# Patient Record
Sex: Female | Born: 1935 | Race: White | Hispanic: No | Marital: Married | Smoking: Former smoker
Health system: Southern US, Community
[De-identification: ages and names within clinical notes are randomized; demographics above are authoritative.]

## PROBLEM LIST (undated history)

## (undated) DIAGNOSIS — J189 Pneumonia, unspecified organism: Secondary | ICD-10-CM

## (undated) DIAGNOSIS — F32A Depression, unspecified: Secondary | ICD-10-CM

## (undated) DIAGNOSIS — I714 Abdominal aortic aneurysm, without rupture, unspecified: Secondary | ICD-10-CM

## (undated) DIAGNOSIS — M1990 Unspecified osteoarthritis, unspecified site: Secondary | ICD-10-CM

## (undated) DIAGNOSIS — G8929 Other chronic pain: Secondary | ICD-10-CM

## (undated) DIAGNOSIS — IMO0002 Reserved for concepts with insufficient information to code with codable children: Secondary | ICD-10-CM

## (undated) DIAGNOSIS — I471 Supraventricular tachycardia: Secondary | ICD-10-CM

## (undated) DIAGNOSIS — F0390 Unspecified dementia without behavioral disturbance: Secondary | ICD-10-CM

## (undated) DIAGNOSIS — M4800 Spinal stenosis, site unspecified: Secondary | ICD-10-CM

## (undated) DIAGNOSIS — R0602 Shortness of breath: Secondary | ICD-10-CM

## (undated) DIAGNOSIS — F329 Major depressive disorder, single episode, unspecified: Secondary | ICD-10-CM

## (undated) DIAGNOSIS — K219 Gastro-esophageal reflux disease without esophagitis: Secondary | ICD-10-CM

## (undated) DIAGNOSIS — R51 Headache: Secondary | ICD-10-CM

## (undated) DIAGNOSIS — G4700 Insomnia, unspecified: Secondary | ICD-10-CM

## (undated) DIAGNOSIS — T4145XA Adverse effect of unspecified anesthetic, initial encounter: Secondary | ICD-10-CM

## (undated) DIAGNOSIS — G629 Polyneuropathy, unspecified: Secondary | ICD-10-CM

## (undated) DIAGNOSIS — F419 Anxiety disorder, unspecified: Secondary | ICD-10-CM

## (undated) DIAGNOSIS — M545 Low back pain, unspecified: Secondary | ICD-10-CM

## (undated) DIAGNOSIS — M79606 Pain in leg, unspecified: Secondary | ICD-10-CM

## (undated) DIAGNOSIS — I10 Essential (primary) hypertension: Secondary | ICD-10-CM

## (undated) DIAGNOSIS — I480 Paroxysmal atrial fibrillation: Secondary | ICD-10-CM

## (undated) DIAGNOSIS — J42 Unspecified chronic bronchitis: Secondary | ICD-10-CM

## (undated) DIAGNOSIS — R296 Repeated falls: Secondary | ICD-10-CM

## (undated) DIAGNOSIS — T8859XA Other complications of anesthesia, initial encounter: Secondary | ICD-10-CM

## (undated) DIAGNOSIS — K5909 Other constipation: Secondary | ICD-10-CM

## (undated) DIAGNOSIS — I639 Cerebral infarction, unspecified: Secondary | ICD-10-CM

## (undated) DIAGNOSIS — I4719 Other supraventricular tachycardia: Secondary | ICD-10-CM

## (undated) DIAGNOSIS — E039 Hypothyroidism, unspecified: Secondary | ICD-10-CM

## (undated) DIAGNOSIS — G4730 Sleep apnea, unspecified: Secondary | ICD-10-CM

## (undated) HISTORY — PX: VAGINAL HYSTERECTOMY: SUR661

## (undated) HISTORY — PX: KNEE ARTHROSCOPY: SHX127

## (undated) HISTORY — PX: CHOLECYSTECTOMY: SHX55

## (undated) HISTORY — PX: ANTERIOR CERVICAL DECOMP/DISCECTOMY FUSION: SHX1161

## (undated) HISTORY — PX: DOPPLER ECHOCARDIOGRAPHY: SHX263

## (undated) HISTORY — DX: Other supraventricular tachycardia: I47.19

## (undated) HISTORY — DX: Repeated falls: R29.6

## (undated) HISTORY — PX: DILATION AND CURETTAGE OF UTERUS: SHX78

## (undated) HISTORY — DX: Abdominal aortic aneurysm, without rupture: I71.4

## (undated) HISTORY — DX: Cerebral infarction, unspecified: I63.9

## (undated) HISTORY — DX: Paroxysmal atrial fibrillation: I48.0

## (undated) HISTORY — PX: EYE SURGERY: SHX253

## (undated) HISTORY — PX: APPENDECTOMY: SHX54

## (undated) HISTORY — DX: Supraventricular tachycardia: I47.1

## (undated) HISTORY — DX: Abdominal aortic aneurysm, without rupture, unspecified: I71.40

## (undated) HISTORY — PX: TONSILLECTOMY: SUR1361

## (undated) HISTORY — DX: Essential (primary) hypertension: I10

## (undated) HISTORY — PX: LUMBAR DISC SURGERY: SHX700

---

## 1998-06-27 ENCOUNTER — Other Ambulatory Visit: Admission: RE | Admit: 1998-06-27 | Discharge: 1998-06-27 | Payer: Self-pay | Admitting: Internal Medicine

## 1998-08-23 ENCOUNTER — Ambulatory Visit (HOSPITAL_COMMUNITY): Admission: RE | Admit: 1998-08-23 | Discharge: 1998-08-23 | Payer: Self-pay | Admitting: Internal Medicine

## 1998-09-21 ENCOUNTER — Inpatient Hospital Stay (HOSPITAL_COMMUNITY): Admission: EM | Admit: 1998-09-21 | Discharge: 1998-09-25 | Payer: Self-pay | Admitting: Emergency Medicine

## 1998-09-21 ENCOUNTER — Encounter: Payer: Self-pay | Admitting: Internal Medicine

## 1998-09-23 ENCOUNTER — Encounter: Payer: Self-pay | Admitting: Internal Medicine

## 1998-11-20 ENCOUNTER — Ambulatory Visit (HOSPITAL_COMMUNITY): Admission: RE | Admit: 1998-11-20 | Discharge: 1998-11-20 | Payer: Self-pay | Admitting: Internal Medicine

## 1999-07-21 ENCOUNTER — Other Ambulatory Visit: Admission: RE | Admit: 1999-07-21 | Discharge: 1999-07-21 | Payer: Self-pay | Admitting: Gynecology

## 1999-08-08 HISTORY — PX: CATARACT EXTRACTION W/ INTRAOCULAR LENS  IMPLANT, BILATERAL: SHX1307

## 1999-10-13 ENCOUNTER — Emergency Department (HOSPITAL_COMMUNITY): Admission: EM | Admit: 1999-10-13 | Discharge: 1999-10-13 | Payer: Self-pay | Admitting: Emergency Medicine

## 1999-10-13 ENCOUNTER — Encounter: Payer: Self-pay | Admitting: Emergency Medicine

## 2001-03-24 ENCOUNTER — Other Ambulatory Visit: Admission: RE | Admit: 2001-03-24 | Discharge: 2001-03-24 | Payer: Self-pay | Admitting: Gynecology

## 2001-06-03 ENCOUNTER — Encounter: Payer: Self-pay | Admitting: Specialist

## 2001-06-04 ENCOUNTER — Encounter: Payer: Self-pay | Admitting: Specialist

## 2001-06-04 ENCOUNTER — Inpatient Hospital Stay (HOSPITAL_COMMUNITY): Admission: EM | Admit: 2001-06-04 | Discharge: 2001-06-14 | Payer: Self-pay | Admitting: Emergency Medicine

## 2001-06-06 ENCOUNTER — Encounter: Payer: Self-pay | Admitting: Specialist

## 2002-05-26 ENCOUNTER — Other Ambulatory Visit: Admission: RE | Admit: 2002-05-26 | Discharge: 2002-05-26 | Payer: Self-pay | Admitting: Gynecology

## 2002-08-31 ENCOUNTER — Encounter: Admission: RE | Admit: 2002-08-31 | Discharge: 2002-08-31 | Payer: Self-pay | Admitting: Gynecology

## 2002-08-31 ENCOUNTER — Encounter: Payer: Self-pay | Admitting: Gynecology

## 2003-01-25 ENCOUNTER — Encounter: Payer: Self-pay | Admitting: Orthopedic Surgery

## 2003-01-30 ENCOUNTER — Inpatient Hospital Stay (HOSPITAL_COMMUNITY): Admission: RE | Admit: 2003-01-30 | Discharge: 2003-02-01 | Payer: Self-pay | Admitting: Orthopedic Surgery

## 2003-08-30 ENCOUNTER — Ambulatory Visit (HOSPITAL_BASED_OUTPATIENT_CLINIC_OR_DEPARTMENT_OTHER): Admission: RE | Admit: 2003-08-30 | Discharge: 2003-08-30 | Payer: Self-pay | Admitting: Orthopedic Surgery

## 2003-11-19 ENCOUNTER — Encounter: Admission: RE | Admit: 2003-11-19 | Discharge: 2003-11-19 | Payer: Self-pay | Admitting: Gynecology

## 2005-07-22 ENCOUNTER — Ambulatory Visit (HOSPITAL_COMMUNITY): Admission: RE | Admit: 2005-07-22 | Discharge: 2005-07-22 | Payer: Self-pay | Admitting: Urology

## 2005-07-22 ENCOUNTER — Ambulatory Visit (HOSPITAL_BASED_OUTPATIENT_CLINIC_OR_DEPARTMENT_OTHER): Admission: RE | Admit: 2005-07-22 | Discharge: 2005-07-22 | Payer: Self-pay | Admitting: Urology

## 2005-07-22 ENCOUNTER — Encounter (INDEPENDENT_AMBULATORY_CARE_PROVIDER_SITE_OTHER): Payer: Self-pay | Admitting: *Deleted

## 2005-09-14 ENCOUNTER — Other Ambulatory Visit: Admission: RE | Admit: 2005-09-14 | Discharge: 2005-09-14 | Payer: Self-pay | Admitting: Gynecology

## 2006-08-25 ENCOUNTER — Emergency Department (HOSPITAL_COMMUNITY): Admission: EM | Admit: 2006-08-25 | Discharge: 2006-08-25 | Payer: Self-pay | Admitting: Emergency Medicine

## 2006-09-21 ENCOUNTER — Ambulatory Visit (HOSPITAL_BASED_OUTPATIENT_CLINIC_OR_DEPARTMENT_OTHER): Admission: RE | Admit: 2006-09-21 | Discharge: 2006-09-21 | Payer: Self-pay | Admitting: Orthopedic Surgery

## 2007-06-01 ENCOUNTER — Encounter: Admission: RE | Admit: 2007-06-01 | Discharge: 2007-06-01 | Payer: Self-pay | Admitting: Gynecology

## 2007-10-26 ENCOUNTER — Ambulatory Visit: Payer: Self-pay | Admitting: Internal Medicine

## 2007-11-10 ENCOUNTER — Ambulatory Visit: Payer: Self-pay | Admitting: Internal Medicine

## 2007-12-14 ENCOUNTER — Encounter: Admission: RE | Admit: 2007-12-14 | Discharge: 2007-12-14 | Payer: Self-pay | Admitting: Internal Medicine

## 2008-01-05 DIAGNOSIS — K5901 Slow transit constipation: Secondary | ICD-10-CM

## 2008-01-05 DIAGNOSIS — K2900 Acute gastritis without bleeding: Secondary | ICD-10-CM | POA: Insufficient documentation

## 2008-06-06 ENCOUNTER — Encounter: Admission: RE | Admit: 2008-06-06 | Discharge: 2008-06-06 | Payer: Self-pay | Admitting: Gynecology

## 2010-01-17 ENCOUNTER — Encounter (INDEPENDENT_AMBULATORY_CARE_PROVIDER_SITE_OTHER): Payer: Self-pay | Admitting: *Deleted

## 2010-01-17 ENCOUNTER — Encounter: Payer: Self-pay | Admitting: Nurse Practitioner

## 2010-01-17 ENCOUNTER — Encounter: Admission: RE | Admit: 2010-01-17 | Discharge: 2010-01-17 | Payer: Self-pay | Admitting: Emergency Medicine

## 2010-01-20 ENCOUNTER — Telehealth: Payer: Self-pay | Admitting: Internal Medicine

## 2010-01-22 ENCOUNTER — Ambulatory Visit: Payer: Self-pay | Admitting: Internal Medicine

## 2010-01-22 DIAGNOSIS — G459 Transient cerebral ischemic attack, unspecified: Secondary | ICD-10-CM | POA: Insufficient documentation

## 2010-01-22 DIAGNOSIS — K5730 Diverticulosis of large intestine without perforation or abscess without bleeding: Secondary | ICD-10-CM | POA: Insufficient documentation

## 2010-01-22 LAB — CONVERTED CEMR LAB
AST: 120 units/L — ABNORMAL HIGH (ref 0–37)
Albumin: 3.8 g/dL (ref 3.5–5.2)
BUN: 27 mg/dL — ABNORMAL HIGH (ref 6–23)
Basophils Relative: 0 % (ref 0.0–3.0)
Bilirubin, Direct: 0.2 mg/dL (ref 0.0–0.3)
Bilirubin, Direct: 0.2 mg/dL (ref 0.0–0.3)
Calcium: 8.7 mg/dL (ref 8.4–10.5)
Chloride: 102 meq/L (ref 96–112)
Eosinophils Absolute: 0.5 10*3/uL (ref 0.0–0.7)
Eosinophils Relative: 6.4 % — ABNORMAL HIGH (ref 0.0–5.0)
Glucose, Bld: 87 mg/dL (ref 70–99)
Indirect Bilirubin: 0.4 mg/dL (ref 0.0–0.9)
Lipase: 29 units/L (ref 11.0–59.0)
Lymphocytes Relative: 11.8 % — ABNORMAL LOW (ref 12.0–46.0)
Neutrophils Relative %: 71.4 % (ref 43.0–77.0)
Potassium: 4.7 meq/L (ref 3.5–5.1)
RBC: 3.62 M/uL — ABNORMAL LOW (ref 3.87–5.11)
Total Protein: 6.4 g/dL (ref 6.0–8.3)
WBC: 7.7 10*3/uL (ref 4.5–10.5)

## 2010-01-23 ENCOUNTER — Encounter: Payer: Self-pay | Admitting: Nurse Practitioner

## 2010-01-23 ENCOUNTER — Ambulatory Visit: Payer: Self-pay | Admitting: Gastroenterology

## 2010-01-23 DIAGNOSIS — R932 Abnormal findings on diagnostic imaging of liver and biliary tract: Secondary | ICD-10-CM

## 2010-01-23 DIAGNOSIS — M549 Dorsalgia, unspecified: Secondary | ICD-10-CM

## 2010-01-28 ENCOUNTER — Ambulatory Visit: Payer: Self-pay | Admitting: Nurse Practitioner

## 2010-01-28 ENCOUNTER — Telehealth: Payer: Self-pay | Admitting: Nurse Practitioner

## 2010-01-28 LAB — CONVERTED CEMR LAB: Prothrombin Time: 10.2 s (ref 9.1–11.7)

## 2010-01-29 ENCOUNTER — Encounter: Payer: Self-pay | Admitting: Gastroenterology

## 2010-02-07 ENCOUNTER — Ambulatory Visit (HOSPITAL_COMMUNITY): Admission: RE | Admit: 2010-02-07 | Discharge: 2010-02-07 | Payer: Self-pay | Admitting: Gastroenterology

## 2010-02-07 ENCOUNTER — Telehealth: Payer: Self-pay | Admitting: Gastroenterology

## 2010-02-07 ENCOUNTER — Ambulatory Visit: Payer: Self-pay | Admitting: Gastroenterology

## 2010-02-11 HISTORY — PX: OTHER SURGICAL HISTORY: SHX169

## 2010-02-14 ENCOUNTER — Ambulatory Visit: Payer: Self-pay | Admitting: Gastroenterology

## 2010-02-14 LAB — CONVERTED CEMR LAB
AST: 18 units/L (ref 0–37)
Albumin: 3.7 g/dL (ref 3.5–5.2)
BUN: 28 mg/dL — ABNORMAL HIGH (ref 6–23)
Calcium: 8.5 mg/dL (ref 8.4–10.5)
Creatinine, Ser: 1.2 mg/dL (ref 0.4–1.2)
GFR calc non Af Amer: 45.61 mL/min (ref >60)
Potassium: 4.4 meq/L (ref 3.5–5.1)
Total Bilirubin: 0.5 mg/dL (ref 0.3–1.2)

## 2010-02-18 ENCOUNTER — Ambulatory Visit: Payer: Self-pay | Admitting: Gastroenterology

## 2010-12-31 ENCOUNTER — Encounter: Admission: RE | Admit: 2010-12-31 | Discharge: 2010-12-31 | Payer: Self-pay | Source: Home / Self Care

## 2011-01-06 NOTE — Assessment & Plan Note (Signed)
Summary: ABNORMAL CT PER DR.DAUB          (DR.BRODIE PT.)         DEBORAH    History of Present Illness Visit Type: Initial Consult Primary GI MD: Lina Sar MD Primary Provider: Lesle Chris, MD Requesting Provider: Lesle Chris, MD Chief Complaint: Abnormal CT History of Present Illness:   Patient seen in the past by Dr. Juanda Chance for constipation. she was last seen at time of her 2008 colonoscopy. Patient referred by Dr. Cleta Alberts for abnormal CTscan and elevated LFTs.   Patient recently fell at home and went to [facility] for evaluation of left rib and back pain. Labs and a CTscan of abd were abnormal. Patient has chronic back pain. She has a spinal cord implant for chronic low back pain. Over the last six months she has developed mid back pain.  Weight stable. No fevers or chills. She has intermitent mild mid abdominal discomfort relieved with application of ice.   Complains of chronic constipation. On chronic narcotics. Uses enemas on occasion. Takes Glycolax but  not everyday secondary to loose stool.    GI Review of Systems      Denies abdominal pain, acid reflux, belching, bloating, chest pain, dysphagia with liquids, dysphagia with solids, heartburn, loss of appetite, nausea, vomiting, vomiting blood, weight loss, and  weight gain.        Denies anal fissure, black tarry stools, change in bowel habit, constipation, diarrhea, diverticulosis, fecal incontinence, heme positive stool, hemorrhoids, irritable bowel syndrome, jaundice, light color stool, liver problems, rectal bleeding, and  rectal pain. Preventive Screening-Counseling & Management  Alcohol-Tobacco     Smoking Status: quit    Current Medications (verified): 1)  Synthroid 112 Mcg Tabs (Levothyroxine Sodium) .... Once Daily 2)  Effexor Xr 75 Mg Xr24h-Cap (Venlafaxine Hcl) .... Two Times A Day 3)  Neurontin 600 Mg Tabs (Gabapentin) .... Take 2 Tablets Four Times A Day 4)  Oxycontin 10 Mg Xr12h-Tab (Oxycodone Hcl) ....  Three Times A Day 5)  Sanctura 20 Mg Tabs (Trospium Chloride) .... Once Daily 6)  Omeprazole 20 Mg Cpdr (Omeprazole) .... Once Daily 7)  Trimethoprim 100 Mg Tabs (Trimethoprim) .... At Bedtime 8)  Enablex 7.5 Mg Xr24h-Tab (Darifenacin Hydrobromide) .... Two Times A Day 9)  Arthrotec 75 75-200 Mg-Mcg Tabs (Diclofenac-Misoprostol) .... Take 1 Tablet By Mouth Two Times A Day 10)  Oxycodone Hcl 5 Mg Tabs (Oxycodone Hcl) .... Take 1-2 Tablets Every 4-6 Hours Daily  Allergies (verified): 1)  ! * Systemic Steroids 2)  ! Codeine 3)  ! * Talacen 4)  ! * Hctz 5)  ! Biaxin  Past History:  Past Medical History: Chronic Back Pain CONSTIPATION, SLOW TRANSIT (ICD-564.01) GASTRITIS, ACUTE (ICD-535.00) Diverticulosis ?Depression, on Effexor  Past Surgical History: Reviewed history from 01/22/2010 and no changes required. Appendectomy Cholecystectomy Hysterectomy Torn rotator cuff, right shoulder Release of right ring finger A1 pulley Cystourethroscopy, hydraulic bladder distention, bladder biopsy,removal of perineal lesion Release of right transverse carpal ligament. Release of right long finger A1 pulley and A0 pulley  Family History: Reviewed history from 01/22/2010 and no changes required. Family History of Lung cancer: Sister Family History of Bone Cancer: Sister Family History of Heart Disease: father  Social History:   Alcohol Use - no Illicit Drug Use - no Patient is a former smoker.  Daily Caffeine Use Smoking Status:  quit  Review of Systems       The patient complains of back pain, confusion, itching, sleeping problems,  swelling of feet/legs, urination - excessive, and urine leakage.  The patient denies allergy/sinus, anemia, anxiety-new, arthritis/joint pain, blood in urine, breast changes/lumps, change in vision, cough, coughing up blood, depression-new, fainting, fatigue, fever, headaches-new, hearing problems, heart murmur, heart rhythm changes, menstrual pain, muscle  pains/cramps, night sweats, nosebleeds, pregnancy symptoms, shortness of breath, skin rash, sore throat, swollen lymph glands, thirst - excessive , urination - excessive , urination changes/pain, vision changes, and voice change.    Vital Signs:  Patient profile:   77 year old female Height:      63 inches Weight:      124.50 pounds BMI:     22.13 Pulse rate:   80 / minute Pulse rhythm:   irregular BP sitting:   132 / 80  (left arm) Cuff size:   regular  Vitals Entered By: June McMurray CMA Duncan Dull) (January 23, 2010 1:44 PM)  Physical Exam  General:  Well developed, well nourished, no acute distress. Head:  Normocephalic and atraumatic. Eyes:  Conjunctiva pink, no icterus.   Ears:  Conjunctiva pink, no icterus.  Mouth:  Tongue slightly dry Neck:  no obvious masses  Lungs:  Clear throughout to auscultation. Heart:  RRR Abdomen:  Abdomen soft, nontender, fullness in LUQ , nondistended. No obvious masses or hepatomegaly.Normal bowel sounds.  Extremities:  1+ BLE edema Neurologic:  Alert and  oriented x4;  grossly normal neurologically. Skin:  Intact without significant lesions or rashes. Cervical Nodes:  No significant cervical adenopathy. Psych:  Alert and cooperative. Normal mood and affect.   Impression & Recommendations:  Problem # 1:  NONSPECIFIC ABN FINDNG RAD&OTH EXAM BILARY TRCT (ICD-793.3) Assessment Comment Only Recent CTscan  (non-contrast) done for left rib and back pain following a fall reveals extensive intrahepatic and extrahepatic biliary dilatation extending to the duodenum.  No associated pancreatic ductal dilatation.  Appearance suspicious for distal biliary duct stricture, non radiodense obstructing choledocholithiasis, or an an ampullary lesion.  LFTs - Normal bilirubin, alkaline phosphatase 312, AST 151, ALT 170.  Post remote cholecystectomy. Patient will need ERCP. Procedure will be done with profofol by Dr. Arlyce Dice at [facility] next Wed.  The risks and  benefits of the procedure, as well as alternatives were discussed with the patient and she agrees to proceed. Intravenous antibiotics to be given just prior to ERCP.  Orders: ERCP (ERCP)  Problem # 2:  DIVERTICULOSIS, COLON (ICD-562.10) Assessment: Comment Only Colonoscopy Dec. 2008 -Moderately severe diverticulosis  Problem # 3:  BACK PAIN, CHRONIC (ICD-724.5) Assessment: Comment Only On chronic narcotics and has spinal cord implant.   Patient Instructions: 1)  Dr. Arlyce Dice will see you at [facility] on 01-29-10 for the Procedure, ERCP.  2)  ERCP brochure provided. 3)  Copy sent to : Lesle Chris, MD

## 2011-01-06 NOTE — Progress Notes (Signed)
Summary: Labs/REV Scheduled   Phone Note Outgoing Call   Call placed by: Laureen Ochs LPN,  February 07, 2010 4:08 PM Call placed to: Patient Summary of Call: Pt.is scheduled for repeat labs on 02-14-10 and an OV with Dr.Joash Tony on 02-18-10 at 4pm. All appt. information reviewed with Mr.Simon by phone.Pt. instructed to call back as needed.  Initial call taken by: Laureen Ochs LPN,  February 07, 2010 4:11 PM

## 2011-01-06 NOTE — Procedures (Signed)
Summary: ERCP  Patient: Chelsea Lynch Note: All result statuses are Final unless otherwise noted.  Tests: (1) ERCP (ERC)   ERC ERCP                  DONE     [facility]     [address] [place], [place]  [address]           ERCP PROCEDURE REPORT           PATIENT:  Chelsea Lynch, Chelsea Lynch  MR#:  [id number]     BIRTHDATE:  1924/12/24  GENDER:  female           ENDOSCOPIST:  Barbette Hair. Arlyce Dice, MD     ASSISTANT:           PROCEDURE DATE:  02/07/2010     PROCEDURE:  ERCP w/ direct viz bile duct, ERCP with sphincterotomy           INDICATIONS:  abnormal Liver Function Tests           MEDICATIONS:   MAC sedation, administered by CRNA, cipro 400 IV     TOPICAL ANESTHETIC:           DESCRIPTION OF PROCEDURE:   After the risks benefits and     alternatives of the procedure were thoroughly explained, informed     consent was obtained.  The  endoscope was introduced through the     mouth and advanced to the third portion of the duodenum.           A normal appearing ampulla was visualized. There was no evidence     of papillitis or any trauma to the ampulla (see image2 and     image3).  A dilatation was found in the common bile duct.  A     dilatation was found in the extrahepatic ducts. Marked diffuse     dilitation of biliary system.     15mm sphincterotomy was made. There was marked drainage of bile     and dye following sphincterotomy.  Cannulation of the common bile     duct was accomplished. The common bile duct and intrahepatics were     normal without filling defects, strictures, or stones. in the     common bile duct. Choledochoscope was passed directly into the     distal CBD up to the level of the cystic duct takeoff. No     abnormalities were seen.    The scope was then completely     withdrawn from the patient and the procedure terminated.     <<PROCEDUREIMAGES>>           COMPLICATIONS:  None           ENDOSCOPIC IMPRESSION:     1) Normal ampulla     2)  Dilatation in the common bile duct     3) Dilatation in the extrahepatic ducts     4) Normal direct visualization of the CBD in the common bile     duct           Findings compatible with ampullary fibrosis           RECOMMENDATIONS:     1) liver enzymes 7-10 days     2) office visit 10 days           ______________________________     Barbette Hair. Arlyce Dice, MD           cc: Dr. Viviann Spare  Daub           n.     eSIGNED:   Barbette Hair. Kincaid Tiger at 02/07/2010 01:58 PM           Ines Bloomer, [id number]  Note: An exclamation mark (!) indicates a result that was not dispersed into the flowsheet. Document Creation Date: 02/07/2010 1:59 PM _______________________________________________________________________  (1) Order result status: Final Collection or observation date-time: 02/07/2010 13:51 Requested date-time:  Receipt date-time:  Reported date-time:  Referring Physician:   Ordering Physician: Melvia Heaps [phone]) Specimen Source:  Source: Launa Grill Order Number: [id number] Lab site:   Appended Document: ERCP Thank Tamera Reason, DB

## 2011-01-06 NOTE — Progress Notes (Signed)
Summary: TRIAGE-Urgent Appt.   Phone Note From Other Clinic Call back at [phone]  [phone]   Caller: Patient Caller: Lupita Leash, ref coor Call For: Dr. Juanda Chance Reason for Call: Schedule Patient Appt Summary of Call: Dr. Lesle Chris would like pt sch'ed asap with Dr. Juanda Chance for abdnormal CT, poss biliaryduct obstruction Initial call taken by: Vallarie Mare,  January 20, 2010 2:34 PM  Follow-up for Phone Call        Message left for Lupita Leash: Pt. will see Willette Cluster NP on 01-21-10 at 2pm. Lupita Leash will advise pt. of appt/med.list/co-pay/cx.policy. I requested she fax records to [phone] Attn: Hulda Marin can call me at [phone]. Follow-up by: Laureen Ochs LPN,  January 20, 2010 3:25 PM     Appended Document: TRIAGE-Urgent Appt. chart reviewed, I have seen pt only for screening colon. I agree with her seeing Tomasa Hosteller ask pt to have labs drawn in am 01/21/2010 for CBC,C-met,total, direct and indirect bili, ,amy, lipase Ca19-9, CEA,Protime.  Appended Document: TRIAGE-Urgent Appt. I called patient to advise her that she needed to have labwork drawn before her appointment with Willette Cluster, NP today. Patient was never told about appointment by Dr Ellis Parents office. Dr Ellis Parents office had already set them up for an appointment with a cardiologist at 2 pm on 01/21/10. I advised the patient that Dr Ellis Parents office requested they be seen as soon as possible, however, they wish to keep the cardiologist appt today and reschedule our appt until 01/23/10 (the next appt available for [facility].Marland KitchenMarland KitchenDr Juanda Chance is out of the office). I have rescheduled the appointment and have advised Laureen Ochs, LPN of this (she scheduled the appt with Lupita Leash at Dr Ellis Parents office). Gavin Pound will call Lupita Leash to discuss. Patient has been advised that even if they reschedule their appt to 2/17, they still must come for labwork today per Dr Regino Schultze request. Patient states that they will come for labs today.  Appended Document:  TRIAGE-Urgent Appt. Message left to advise Donna/Dr.Daub pt. r/s appt. to 01-23-10.  Appended Document: TRIAGE-Urgent Appt. Dr.Daub's office called to say it will be O.K. for pt. to be seen 01-23-10. They will also confirm the appt. w/pt.

## 2011-01-06 NOTE — Procedures (Signed)
Summary: Instructions for procedure/[facility] (out pt)  Instructions for procedure/[facility] (out pt)   Imported By: Sherian Rein 01/28/2010 07:03:49  _____________________________________________________________________  External Attachment:    Type:   Image     Comment:   External Document

## 2011-01-06 NOTE — Letter (Signed)
Summary: EGD Instructions  [facility]  [address] [place], [place] [address]   Phone: [phone]  Fax: [phone]       Chelsea Lynch    01-26-1925    MRN: [id number]       Procedure Day /Date:01-29-10     Arrival Time: 1:00 PM     Procedure Time: 2:00 PM     Location of Procedure:                     [facility] ( Outpatient Registration)    PREPARATION FOR ENDOSCOPY   On 2-23-11THE DAY OF THE PROCEDURE:  1.   No solid foods, milk or milk products are allowed after midnight the night before your procedure.  2.   Do not drink anything colored red or purple.  Avoid juices with pulp.  No orange juice.  3.  You may drink clear liquids until 10:00 , which is 4 hours before your procedure.                                                                                                CLEAR LIQUIDS INCLUDE: Water Jello Ice Popsicles Tea (sugar ok, no milk/cream) Powdered fruit flavored drinks Coffee (sugar ok, no milk/cream) Gatorade Juice: apple, white grape, white cranberry  Lemonade Clear bullion, consomm, broth Carbonated beverages (any kind) Strained chicken noodle soup Hard Candy   MEDICATION INSTRUCTIONS  Unless otherwise instructed, you should take regular prescription medications with a small sip of water as early as possible the morning of your procedure.         OTHER INSTRUCTIONS  You will need a responsible adult at least 75 years of age to accompany you and drive you home.   This person must remain in the waiting room during your procedure.  Wear loose fitting clothing that is easily removed.  Leave jewelry and other valuables at home.  However, you may wish to bring a book to read or an iPod/MP3 player to listen to music as you wait for your procedure to start.  Remove all body piercing jewelry and leave at home.  Total time from sign-in until discharge is approximately 2-3 hours.  You should go home directly  after your procedure and rest.  You can resume normal activities the day after your procedure.  The day of your procedure you should not:   Drive   Make legal decisions   Operate machinery   Drink alcohol   Return to work  You will receive specific instructions about eating, activities and medications before you leave.    The above instructions have been reviewed and explained to me by   _______________________    I fully understand and can verbalize these instructions _____________________________ Date _________

## 2011-01-06 NOTE — Progress Notes (Signed)
Summary: Informed pt of lab appt today for PT, INR   Phone Note Outgoing Call   Call placed by: Joselyn Glassman,  January 28, 2010 11:13 AM Call placed to: Patient Summary of Call: Called pt per Willette Cluster RNP to let her know we need her to come to our lab today before 5:30 PM for her PT, INR.  She commented she is still not sure about  having this ERCP done tomorrow.  I told her we need to know today if she is not going to be there.  She then said she would come to the lab today.  Initial call taken by: Joselyn Glassman,  January 28, 2010 11:15 AM

## 2011-01-06 NOTE — Letter (Signed)
Summary: [facility]  [facility]   Imported By: Sherian Rein 01/30/2010 07:20:46  _____________________________________________________________________  External Attachment:    Type:   Image     Comment:   External Document

## 2011-01-06 NOTE — Assessment & Plan Note (Signed)
Summary: F/U ERCP AND RECENT LABS            [facility]    History of Present Illness Visit Type: Follow-up Visit Primary GI MD: Lina Sar MD Primary Provider: Romero Liner, MD  Requesting Provider: n/a Chief Complaint: F/u from ERCP and recent labs  History of Present Illness:   Chelsea Lynch has returned following ERCP and sphincterotomy.  This was done because of cholestatic liver tests and a CT scan showing diffuse dilatation of the common bile duct.  These findings were verified at ERCP.  Following sphincterotomy there was prompt emptying of contrast material.  Direct visualization of the bile duct was done with a cholodochoscope;  no other abnormalities were seen.  The patient has no GI complaints.  LFTs on Feb 16,2011, prior to sphincterotomy were AST 120, ALT 157 and alkaline phosphatase 330.  Liver function tests from February 14, 2010 were entirely normal.   GI Review of Systems      Denies abdominal pain, acid reflux, belching, bloating, chest pain, dysphagia with liquids, dysphagia with solids, heartburn, loss of appetite, nausea, vomiting, vomiting blood, weight loss, and  weight gain.        Denies anal fissure, black tarry stools, change in bowel habit, constipation, diarrhea, diverticulosis, fecal incontinence, heme positive stool, hemorrhoids, irritable bowel syndrome, jaundice, light color stool, liver problems, rectal bleeding, and  rectal pain.    Current Medications (verified): 1)  Synthroid 112 Mcg Tabs (Levothyroxine Sodium) .... Once Daily 2)  Effexor Xr 75 Mg Xr24h-Cap (Venlafaxine Hcl) .... Two Times A Day 3)  Neurontin 600 Mg Tabs (Gabapentin) .... Take 2 Tablets Four Times A Day 4)  Oxycontin 10 Mg Xr12h-Tab (Oxycodone Hcl) .... Three Times A Day 5)  Sanctura 20 Mg Tabs (Trospium Chloride) .... Once Daily 6)  Omeprazole 20 Mg Cpdr (Omeprazole) .... Once Daily 7)  Trimethoprim 100 Mg Tabs (Trimethoprim) .... At Bedtime 8)  Enablex 7.5 Mg Xr24h-Tab (Darifenacin  Hydrobromide) .... Two Times A Day 9)  Arthrotec 75 75-200 Mg-Mcg Tabs (Diclofenac-Misoprostol) .... Take 1 Tablet By Mouth Two Times A Day 10)  Oxycodone Hcl 5 Mg Tabs (Oxycodone Hcl) .... Take 1-2 Tablets Every 4-6 Hours Daily 11)  Furosemide 20 Mg Tabs (Furosemide) .... One Tablet By Mouth Once Daily  Allergies (verified): 1)  ! * Systemic Steroids 2)  ! Codeine 3)  ! * Talacen 4)  ! * Hctz 5)  ! Biaxin  Past History:  Past Medical History: Reviewed history from 01/23/2010 and no changes required. Chronic Back Pain CONSTIPATION, SLOW TRANSIT (ICD-564.01) GASTRITIS, ACUTE (ICD-535.00) Diverticulosis ?Depression, on Effexor  Past Surgical History: Reviewed history from 01/22/2010 and no changes required. Appendectomy Cholecystectomy Hysterectomy Torn rotator cuff, right shoulder Release of right ring finger A1 pulley Cystourethroscopy, hydraulic bladder distention, bladder biopsy,removal of perineal lesion Release of right transverse carpal ligament. Release of right long finger A1 pulley and A0 pulley  Family History: Family History of Lung cancer: Sister Family History of Bone Cancer: Sister Family History of Heart Disease: father No FH of Colon Cancer:  Social History: Retired  Married  Alcohol Use - no Illicit Drug Use - no Patient is a former smoker.  Daily Caffeine Use  Review of Systems       The patient complains of back pain and cough.  The patient denies allergy/sinus, anemia, anxiety-new, arthritis/joint pain, blood in urine, breast changes/lumps, change in vision, confusion, coughing up blood, depression-new, fainting, fatigue, fever, headaches-new, hearing problems, heart murmur,  heart rhythm changes, itching, menstrual pain, muscle pains/cramps, night sweats, nosebleeds, pregnancy symptoms, shortness of breath, skin rash, sleeping problems, sore throat, swelling of feet/legs, swollen lymph glands, thirst - excessive, urination - excessive, urination  changes/pain, urine leakage, vision changes, and voice change.    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  The patient will return to Dr. Regino Schultze  care for any other GI problems.  Patient Instructions: 1)  Follow with Dr Juanda Chance as needed 2)  The medication list was reviewed and reconciled.  All changed / newly prescribed medications were explained.  A complete medication list was provided to the patient / caregiver.

## 2011-01-06 NOTE — Letter (Signed)
Summary: EGD Instructions  [facility]  [address] [place], [place] [address]   Phone: [phone]  Fax: [phone]       Chelsea Lynch    12-Jun-1925    MRN: [id number]       Procedure Day Dorna Bloom:  [facility] MARCH 4TH, 2011     Arrival Time:  10:30AM     Procedure Time:  12:30PM     Location of Procedure:                     [facility] ( Outpatient Registration)   PREPARATION FOR ERCP   ON THE DAY OF THE PROCEDURE: FRIDAY MARCH 4TH, 2011  1.   No solid foods, milk or milk products are allowed after midnight the night before your procedure.  2.   Do not drink anything colored red or purple.  Avoid juices with pulp.  No orange juice.                                                                                                  CLEAR LIQUIDS INCLUDE: Water Jello Ice Popsicles Tea (sugar ok, no milk/cream) Powdered fruit flavored drinks Coffee (sugar ok, no milk/cream) Gatorade Juice: apple, white grape, white cranberry  Lemonade Clear bullion, consomm, broth Carbonated beverages (any kind) Strained chicken noodle soup Hard Candy   MEDICATION INSTRUCTIONS  Unless otherwise instructed, you should take regular prescription medications with a small sip of water as early as possible the morning of your procedure.          OTHER INSTRUCTIONS  You will need a responsible adult at least 75 years of age to accompany you and drive you home.   This person must remain in the waiting room during your procedure.  Wear loose fitting clothing that is easily removed.  Leave jewelry and other valuables at home.  However, you may wish to bring a book to read or an iPod/MP3 player to listen to music as you wait for your procedure to start.  Remove all body piercing jewelry and leave at home.  Total time from sign-in until discharge is approximately 3-5 hours.  You should go home directly after your procedure and rest.  You can resume normal  activities the day after your procedure.  The day of your procedure you should not:   Drive   Make legal decisions   Operate machinery   Drink alcohol   Return to work  You will receive specific instructions about eating, activities and medications before you leave.    The above instructions have been reviewed and explained to [name] by phone and mailed to her by Laureen Ochs LPN  January 29, 2010 2:38 PM      Appended Document: EGD Instructions Letter mailed to patient.

## 2011-02-05 ENCOUNTER — Other Ambulatory Visit: Payer: Self-pay | Admitting: Internal Medicine

## 2011-02-05 DIAGNOSIS — R209 Unspecified disturbances of skin sensation: Secondary | ICD-10-CM

## 2011-02-06 ENCOUNTER — Other Ambulatory Visit: Payer: Self-pay

## 2011-02-12 HISTORY — PX: OTHER SURGICAL HISTORY: SHX169

## 2011-02-18 ENCOUNTER — Ambulatory Visit
Admission: RE | Admit: 2011-02-18 | Discharge: 2011-02-18 | Disposition: A | Discharge status: Home / Self Care | Payer: Medicare Other | Source: Ambulatory Visit | Attending: Internal Medicine | Admitting: Internal Medicine

## 2011-02-18 DIAGNOSIS — R209 Unspecified disturbances of skin sensation: Secondary | ICD-10-CM

## 2011-02-19 HISTORY — PX: OTHER SURGICAL HISTORY: SHX169

## 2011-04-12 ENCOUNTER — Emergency Department (HOSPITAL_COMMUNITY)
Admission: EM | Admit: 2011-04-12 | Discharge: 2011-04-12 | Disposition: A | Discharge status: Home / Self Care | Payer: Medicare Other | Attending: Emergency Medicine | Admitting: Emergency Medicine

## 2011-04-12 DIAGNOSIS — W2203XA Walked into furniture, initial encounter: Secondary | ICD-10-CM | POA: Insufficient documentation

## 2011-04-12 DIAGNOSIS — Z23 Encounter for immunization: Secondary | ICD-10-CM | POA: Insufficient documentation

## 2011-04-12 DIAGNOSIS — Y92009 Unspecified place in unspecified non-institutional (private) residence as the place of occurrence of the external cause: Secondary | ICD-10-CM | POA: Insufficient documentation

## 2011-04-12 DIAGNOSIS — S51809A Unspecified open wound of unspecified forearm, initial encounter: Secondary | ICD-10-CM | POA: Insufficient documentation

## 2011-04-21 NOTE — Assessment & Plan Note (Signed)
[facility]                         GASTROENTEROLOGY OFFICE NOTE   EVAH, RASHID                       MRN:          [id number]  DATE:10/26/2007                            DOB:          Apr 08, 1925    Ms. Commerford is an 77 year old white female here to discuss colorectal  screening.  We have seen her in the past for a screening colonoscopy in  1998.  Unfortunately, we do not have the records of the actual exam.  She has over the years had some constipation and has been followed by  Dr. Fannie Knee for asthmatic bronchitis.  I think she also had an upper  endoscopy in 1999, but I do not have that record either.  She had a  prior cholecystectomy.   MEDICATIONS:  1. HCTZ 25 mg p.o. daily.  2. Vesicare 10 mg p.o. daily.  3. Enablex 15 mg p.o. daily.  4. Synthroid 112 mcg daily.  5. Effexor XR 75 mg p.o. b.i.d.  6. Omeprazole 20 mg p.o. q.a.m.  7. Neurontin 600 mg t.i.d.  8. Oxycodone, OxyContin, and Ambien CR.   PAST MEDICAL HISTORY:  1. TIA.  2. Cholecystectomy.  3. Hysterectomy.   FAMILY HISTORY:  Negative for colon cancer.  One sister died of bone  cancer, multiple myeloma, one sister of lung cancer.  Father had heart  disease.   SOCIAL HISTORY:  Married with two children.  She is retired from Engineering geologist  work.  She does not drink alcohol.   REVIEW OF SYSTEMS:  Positive for swelling of her legs, joint pain,  sleeping problems, back pain, leakage of urine, excessive urination,  cystic breast changes.   PHYSICAL EXAMINATION:  Blood pressure 96/64, pulse 60.  Weight 122  pounds.  She is alert and oriented, appearing younger than her stated age.  Sclerae are anicteric.  NECK:  Supple.  LUNGS:  Clear to auscultation.  COR:  Normal S1 and S2.  ABDOMEN:  Soft.  Normoactive bowel sounds.  Nondistended.  Post  cholecystectomy horizontal scar in right upper quadrant.  RECTAL:  Not done.  EXTREMITIES:  No edema.   IMPRESSION:  An 77 year old  white female with constipation, likely  related to decreased transit time caused by multiple narcotics, which  she takes.  She is a good candidate for colonoscopy.  It has been at  least 10 years since the last one.   PLAN:  1. Colonoscopy schedule with Osmoprep.  I have discussed the prep as      well as conscious sedation with the patient.  2. Increased fiber in the diet.  Take MiraLax, as per Dr. Renne Crigler.     Hedwig Morton. Juanda Chance, MD  Electronically Signed    DMB/MedQ  DD: 10/26/2007  DT: 10/26/2007  Job #: [id number]   cc:   Soyla Murphy. Renne Crigler, M.D.

## 2011-04-24 NOTE — Op Note (Signed)
NAME:  DELORUS, LANGWELL                          ACCOUNT NO.:  [id number]   MEDICAL RECORD NO.:  [id number]                   PATIENT TYPE:  INP   LOCATION:  0002                                 FACILITY:  [facility]   PHYSICIAN:  Marlowe Kays, M.D.               DATE OF BIRTH:  1925/02/19   DATE OF PROCEDURE:  01/30/2003  DATE OF DISCHARGE:                                 OPERATIVE REPORT   PREOPERATIVE DIAGNOSIS:  Torn rotator cuff, right shoulder.   POSTOPERATIVE DIAGNOSIS:  Torn rotator cuff, right shoulder.   OPERATION:  1. Open resection, distal right clavicle.  2. Anterior acromionectomy.  3. Repair of torn rotator cuff, right shoulder.   SURGEON:  Marlowe Kays, M.D.   ASSISTANTDruscilla Brownie. Cherlynn June.   ANESTHESIA:  General.   INDICATIONS FOR PROCEDURE:  She had sudden pain in the right shoulder around  November of last year. She came to see me on November 06, 2002, and I sent  her for an MRI which was performed on November 15, 2002, and demonstrated  full thickness tear of the supraspinatus tendon with a defect measuring  about 1.5 cm in diameter. It was also felt that the underneath surface of  the distal clavicle was also a possible deforming factor. At surgery the  tear had actually extended and was now about 3.5 cm and the underneath  surface of the distal clavicle was quite prominent and corresponded to the  tear of the anterior rotator cuff and it was felt that distal clavicle  excision was indicated.   DESCRIPTION OF PROCEDURE:  Prophylactic antibiotics and a scalene block by  the anesthesiologist followed by general anesthesia, the right shoulder  girdle was prepped with Duraprep and draped in a sterile field and she was  placed on the Schlein frame, but because of hypotension, we could not really  use the beach chair position initially, but were able assume it later on as  her hypotension seemed to improve. Collier Flowers was employed.   A vertical incision  was made initially from roughly the acromioclavicular  joint going down vertically. The fascia on the anterior acromion was opened  along with the skin incision back to the acromioclavicular joint which was  identified with a Mellody Dance needle. I then undermined the distal clavicle which  was coming fourth confirming the tear.   Protecting the underlying rotator cuff structures, I made my initial  acromionectomy, preserving the acromioclavicular joint. I then had to  perform little additional removal of bone on the underneath surface of the  acromion. She had fairly exuberant bursa, which I partially excised, and I  then visualized the true extent of the tear, which was avulsed from the bone  at the level of the greater tuberosity, and on the supraspinatus there was a  good bit of healthy tendon anteriorly and laterally. The biceps tendon was  intact. She had a  good bit of prominence of the undersurface of the distal  clavicle which corresponded well to the site of the tear, and it was felt  that a distal clavicle resection was indicated in this particular case.   Accordingly I extended the incision and curved it somewhat anteriorly and  with subperiosteal dissection exposed the distal clavicle. I then undermined  it and protecting all underlying structures with retractors marked out about  a distal 1.5 cm and then used a microsaw to resect this portion of the  clavicle. Small spicules of bone were removed from the undersurface of the  remaining clavicle which were then covered with bone wax.   I then returned to the rotator cuff and after restoring the rotator cuff to  its normal anatomy with hemostats and deciding on a proper approach, I  abraded the bone above the greater tuberosity with a rongeur and then used 1  rotator cuff anchor at this level, bringing the rotator cuff which had  avulsed from the bone, reattaching it at this level.   I then supplemented this with multiple interrupted  Ethibond sutures around  the perimeter, working anteriorly. At this point the large portion of the  tear anteriorly had now come together with the remaining rotator cuff which  was already intact, and I repaired this defect with multiple interrupted #1  Ethibond as well. This gave a nice solid repair  with her arm to her side.  We checked and there did not appear to be any residual impingement.   The wound was then well irrigated with sterile saline. Gelfoam was placed in  the resected clavicle interval. The deltoid muscle was then reapproximated  in 2 layers with interrupted #1 Vicryl and the fascia over the anterior  acromion and distal clavicle resected area with the same. The subcutaneous  tissue was closed with a combination of 2-0 Vicryl and 4-0 Vicryl and the  skin with Steri-Strips. A dry sterile dressing and a shoulder immobilizer  were applied.   She tolerated the procedure well. The patient was transferred to the  recovery room in satisfactory condition with no complications.                                               Marlowe Kays, M.D.    JA/MEDQ  D:  01/30/2003  T:  01/30/2003  Job:  [id number]

## 2011-04-24 NOTE — H&P (Signed)
NAME:  Chelsea Lynch, Chelsea Lynch                          ACCOUNT NO.:  [id number]   MEDICAL RECORD NO.:  [id number]                   PATIENT TYPE:  INP   LOCATION:  NA                                   FACILITY:  [facility]   PHYSICIAN:  Marlowe Kays, M.D.               DATE OF BIRTH:  [birth year]   DATE OF ADMISSION:  01/30/2003  DATE OF DISCHARGE:                                HISTORY & PHYSICAL   CHIEF COMPLAINT:  Pain in my right shoulder.   HISTORY OF PRESENT ILLNESS:  This 77 year old lady had an injury to her  right shoulder in November of last year.  She was reaching out to the right  when she had a severe onset of right shoulder pain, heard a pop, and had  subsequent loss of motion.  She was able to survive with this discomfort  through the holidays and it really could not wait any longer for any  surgical repair due to the progressive nature and then persistent nature of  the pain in the right shoulder.  She has no loss of function of the right  upper extremity other than abduction, internal/external rotation.  She finds  difficulty in getting dressed, doing her hair, and other normal functions  with the right shoulder.  She has had an MRI which has shown a full  thickness tear of the distal supraspinatus tendon.  There was also seen mid  spurring of the undersurface of the distal clavicle as well as subacromial  bursitis and a type 2 acromion with spurring.  This is a very active lady.  She and her husband enjoy active activities and unfortunately she has had  this interfere with those activities.   ALLERGIES:  This patient is allergic to PENICILLIN.   CURRENT MEDICATIONS:  1. Effexor XR 75 mg one b.i.d.  2. Neurontin 600 mg two tablets q.i.d.  3. OxyContin 40 mg one q.i.d.  4. Oxycodone IR 5 mg one capsule four or five times a day.  5. Prevacid 30 mg one daily.  6. Toprol XL 50 mg one-quarter to one-half tablet daily.  7. Synthroid 112 mcg one daily.  8. Premarin 0.625 mg  one daily.  9. Ambien 10 mg at bedtime.  10.      Remeron 30 mg SolTab one at h.s.   Soyla Murphy. Renne Crigler, M.D. is her internal medicine physician.   PAST MEDICAL HISTORY:  1. Hypothyroidism.  2. Hypertension.  3. Diverticulitis.  4. GERD.  5. Chronic low back pain for which she uses a tens unit.   PAST SURGICAL HISTORY:  1. Cervical laminectomy in the past.  2. Lumbar laminectomy x2.  3. She has also had a hysterectomy in 1960.  4. Cholecystectomy in 1970.  5. Bladder tack suspension Marshall-Marchetti in 1985.  6. Appendectomy as a child.  7. Knee surgery 1986.   SOCIAL HISTORY:  The patient is married, homemaker.  Has no intake of  alcohol or tobacco products.  Has two daughters and her husband will be  caregiver after surgery.   FAMILY HISTORY:  Father died of heart disease, mother of a stroke, and a  sister with multiple myeloma.   REVIEW OF SYSTEMS:  CNS:  No seizure, shoulder paralysis, numbness, or  double vision.  The patient has chronic low back pain with radiation of the  right lower extremity.  She is currently in Eloisa Northern, M.D. pain program  using tens unit and the above medications for her chronic pain.  CARDIOVASCULAR:  No chest pain.  No angina.  No orthopnea.  RESPIRATORY:  No  productive cough.  No hemoptysis.  No shortness of breath.  GASTROINTESTINAL:  No nausea, vomiting, melena, or bloody stools.  GENITOURINARY:  The patient has urgency and frequency in the evening and at  night.   PHYSICAL EXAMINATION:  GENERAL:  Alert, cooperative, friendly, somewhat  anxious 77 year old white female looking younger than stated age.  VITAL SIGNS:  Blood pressure 150/84, pulse 84, regular, respirations 12,  unlabored.  HEENT:  Normocephalic.  Oropharynx is clear.  CHEST:  Clear to auscultation.  No rhonchi or rales.  HEART:  Regular rate and rhythm.  No murmurs are heard.  ABDOMEN:  Soft, nontender.  Liver, spleen not felt.  RECTAL:  Not done.  PELVIC:  Not  done.  BREASTS:  Not done.  EXTREMITIES:  Right shoulder as in present illness above.   ADMITTING DIAGNOSES:  1. Tear of rotator cuff of the right shoulder.  2. Hypothyroidism.  3. Hypertension.  4. Chronic low back pain.    PLAN:  The patient will undergo anterior acromionectomy with repair of the  rotator cuff (open) of the right shoulder.  Due to her chronic pain  situation with her back, she will have a scalene block to the right shoulder  and we would anticipate a hospital stay of several days secondary to getting  the pain under control postoperatively for her right shoulder.  Should we  have any medical problems, certainly ask Soyla Murphy. Renne Crigler, M.D. to follow  along with [place].     Dooley L. Cherlynn June.                 Marlowe Kays, M.D.    DLU/MEDQ  D:  01/24/2003  T:  01/24/2003  Job:  [id number]   cc:   Soyla Murphy. Renne Crigler, M.D.  [address]  [place]  [place] [address]  Fax: [phone]

## 2011-04-24 NOTE — Op Note (Signed)
NAMEMICHAELEEN, DOWN                ACCOUNT NO.:  [id number]   MEDICAL RECORD NO.:  [id number]          PATIENT TYPE:  AMB   LOCATION:  DSC                          FACILITY:  [facility]   PHYSICIAN:  Katy Fitch. Sypher, M.D. DATE OF BIRTH:  [birth year]   DATE OF PROCEDURE:  09/21/2006  DATE OF DISCHARGE:                                 OPERATIVE REPORT   PREOP DIAGNOSIS:  1. Chronic right carpal tunnel syndrome.  2. Chronic stenosing tenosynovitis right long finger at A1 pulley.   POSTOP DIAGNOSIS:  1. Chronic right carpal tunnel syndrome.  2. Chronic stenosing tenosynovitis right long finger at A1 pulley.   OPERATION:  1. Release of right transverse carpal ligament.  2. Release of right long finger A1 pulley and A0 pulley.   OPERATING SURGEON:  Katy Fitch. Sypher, M.D.   ASSISTANT:  Marveen Reeks. Dasnoit, P.A.-C.   ANESTHESIA:  General by LMA.   SUPERVISING ANESTHESIOLOGIST:  Quita Skye. Krista Blue, M.D.   INDICATIONS:  Thurma Priego is a 77 year old woman referred through the  courtesy of Dr. Merri Brunette for evaluation and management of bilateral hand  numbness and triggering of her long fingers of the right and left hands.  Ms. Donn is failed nonoperative measures and has abnormal diagnostic  studies.  We recommended proceeding with release of her right transverse  carpal ligament, and release of her right long finger A1 pulley at this  time.  Informed consent was completed in the office and in the preoperative  holding area with Ms. Rajewski's daughter present   PROCEDURE:  Jamilet Ambroise is brought to the operating room and placed in  supine position upon the operating table.  Following induction of general  anesthesia by LMA technique.  The right arm was prepped with Betadine soap  and solution, and sterilely draped.  A pneumatic tourniquet was applied to  the proximal right brachium.  Following exsanguination of the right arm with  an Esmarch bandage, arterial tourniquet is inflated to 220  mmHg.   The procedure commenced with a short incision in the line of the ring finger  and the palm.  The subcutaneous tissues were carefully divided along the  palmar fascia.  This was split longitudinally __________ appearance of the  median nerve.  These were followed back to the transverse carpal ligament  which was gently isolated from the median nerve.  The ligament was then  released along its ulnar border extending into the distal forearm.  This  widely opened the carpal canal.  No masses were noted.  There was noted be a  persistent median vessel which can be a cause of carpal tunnel syndrome.  The wound was then repaired with intradermal 3-0 Prolene suture.   Attention was then directed to the long finger flexor sheath.  There is a  palpable swelling at the A1 pulley.  A 1-cm transverse incision was  fashioned in the distal palmar crease over the swollen A1 pulley.  The  subcutaneous tissue was carefully divided taking care to identify the A1  pulley and release it along its radial border.  Attention  was then directed  proximally where a small accessory A0 pulley was identified.  This was  released subcutaneously.  Thereafter, free range of motion of the long  finger was recovered.   Ms. Hlavaty tolerated surgery and anesthesia well.  Her wounds were repaired  with intradermal 3-0 Prolene and Steri-Strips followed by infiltration of 2%  lidocaine for postoperative analgesia.  She was placed in a compressive  dressing; and advised to begin immediate range of motion excises.  Note for  aftercare, she is provided a prescription for Vicodin 5 mg one p.o. q.4-6 h.  p.r.n. pain 20 tablets without refill.  She will return to see me for  followup in the office in 1 week.      Katy Fitch Sypher, M.D.  Electronically Signed     RVS/MEDQ  D:  09/21/2006  T:  09/22/2006  Job:  [id number]   cc:   Soyla Murphy. Renne Crigler, M.D.

## 2011-04-24 NOTE — H&P (Signed)
[facility]  Patient:    Chelsea Lynch, Chelsea Lynch                       MRN: [id number] Adm. Date:  [id number] Attending:  Erasmo Leventhal Dictator:   Alexzandrew L. Perkins, P.A.-C.                         History and Physical  CHIEF COMPLAINT:  Severe back pain.  HISTORY OF PRESENT ILLNESS:  The patient is a 77 year old female who is well known at [facility] having previously been treated by Chelsea Deter A. Grant Ruts., M.D. for a long history of degenerative disk disease. She has hadmultiple flare-ups in the past. However, approximately two weeks ago, she had a recurrence and a flare-up of her low back pain. This has been crescendo-type pain over the past two weeks to the point where on the evening of June 03, 2001, it became so severe that she required calling the on-call physician. Due to her pain level, she was recommended to go to the hospital for evaluation. She was seen and evaluated and found to have severe low back pain which was intractable at the time. It was decided that she would be admitted for pain control and evaluation. She was subsequently admitted on the morning of June 04, 2001.  ALLERGIES:  PENICILLIN causes a rash.  INTOLERANCES:  CODEINE causes GI upset.  CURRENT MEDICATIONS: 1. OxyContin 40 mg p.o. t.i.d. 2. Micardis 40 mg p.o. q.d. 3. Neurontin 300 mg p.o. q.i.d. 4. Robaxin 500 mg every 6 hours p.r.n. spasm. 5. Effexor 37.5 mg p.o. b.i.d. 6. Toprol XL 50 mg q.d. 7. Vicodin 5 mg p.r.n. pain. 8. Prevacid daily.  PAST MEDICAL HISTORY:  History of hypertension, also longstanding history of degenerative disk disease, and also history of rectocele.  PAST SURGICAL HISTORY:  She has had prior neck surgery, back surgery x 2. She has also had a cholecystectomy and appendectomy, hysterectomy, and also knee surgery.  SOCIAL HISTORY:  She is married. Denies use of tobacco products or alcohol products.  FAMILY HISTORY:   Negative.  REVIEW OF SYSTEMS:  GENERAL:  No fevers, chills, or night sweats. NEUROLOGICAL:  The patient has had some radicular-type pain in the lower extremities which has been increasing over the past two weeks, more so in the right leg but also new symptoms in the left leg. No seizures. No paralysis. RESPIRATORY:  No shortness of breath or productive cough. CARDIOVASCULAR:  No chest pain, angina, or orthopnea. GI:  No nausea, vomiting, diarrhea, or constipation. GU:  No discharge, dysuria, or hematuria. MUSCULOSKELETAL: Pertinent to the back and the legs found in the history of present illness.  PHYSICAL EXAMINATION:  GENERAL:  The patient is a 77 year old, thin, frail-appearing, white female, moderate distress secondary to pain. She is accompanied by her family members. Seen lying supine on a hospital stretcher in the emergency department. Noted to be again in moderate distress.  VITAL SIGNS:  Temperature is 98, blood pressure 151/77, pulse is 70, respirations 20.  HEENT:  Normocephalic, atraumatic. Pupils are round and reactive. Oropharynx is clear.  NECK:  Supple.  CHEST:  Clear to auscultation anteriorly and laterally. No rhonchi or rales appreciated.  HEART:  Regular rate and rhythm. No murmurs. No rubs, thrills, or palpitation are appreciated on exam.  ABDOMEN:  Soft, flat, nontender. Bowel sounds are present. No rebound or guarding is noted.  GENITALIA/RECTAL/BREASTS:  Not  done. Not pertinent to present illness.  MUSCULOSKELETAL:  Pertinent to that of the low back and the lower extremities. The patient is tender to palpation in the lower lumbar region and the lumbosacral region. Motor function:  She does have some weakness noted with right foot dorsiflexion in the EHL as compared to the left. Deep tendon reflexes are present. They are 1+ at the knees bilaterally, +1 at the right ankle, +2 at the left ankle. Sensation is intact throughout the lower extremities with  distal pulses noted to be intact.  LABORATORY DATA:  X-rays show severe degenerative disk changes noted at L4-5 and L5-S1 with facet arthritis.  IMPRESSION: 1. Severe degenerative disk disease with acute exacerbation and radicular pain    components. 2. Rectocele. 3. Hypertension.  PLAN:  The patient will be admitted to [facility] and placed at bedrest. Pain control measures will be provided and workup as indicated. DD:  06/07/01 TD:  06/07/01 Job: [id number]

## 2011-04-24 NOTE — Op Note (Signed)
NAME:  Chelsea Lynch, Chelsea Lynch                          ACCOUNT NO.:  [id number]   MEDICAL RECORD NO.:  [id number]                   PATIENT TYPE:  AMB   LOCATION:  DSC                                  FACILITY:  [facility]   PHYSICIAN:  Katy Fitch. Naaman Plummer., M.D.          DATE OF BIRTH:  February 05, 1925   DATE OF PROCEDURE:  08/30/2003  DATE OF DISCHARGE:                                 OPERATIVE REPORT   PREOPERATIVE DIAGNOSES:  1. Chronic stenosing tenosynovitis, right ring finger.  2. Stenosing tenosynovitis, right thumb.   POSTOPERATIVE DIAGNOSES:  1. Chronic stenosing tenosynovitis, right ring finger.  2. Stenosing tenosynovitis, right thumb.   OPERATION:  1. Release of right ring finger A1 pulley.  2. Injection of right thumb flexor sheath with Depo-Medrol and lidocaine 1%     without epinephrine.   OPERATING SURGEON:  Katy Fitch. Sypher, M.D.   ASSISTANT:  Chelsea Lynch, P.A.   ANESTHESIA:  0.25% Marcaine and 2% lidocaine metacarpal head-level block of  right ring finger and injection of right thumb with a mixture of 1% plain  lidocaine and Depo-Medrol 3mc/mL 0.5 mL, supervised by the  anesthesiologist, Quita Skye. Krista Blue, M.D.   INDICATIONS:  Chelsea Lynch is a 77 year old woman referred for painful  triggering of her thumb and ring fingers.  Clinical examination revealed  signs of mild triggering of the thumb and significant locking of the ring  finger.  The ring finger locking had been going on for months.   Due to a failure to respond to nonoperative measures, she is brought to the  operating room at this time for release of her right ring finger A1 pulley  and injection while under sedation of the right thumb flexor sheath.   After informed consent, she was brought to the operating room.   DESCRIPTION OF PROCEDURE:  Chelsea Lynch is brought to the operating room and  placed in the supine position on the operating table.  Following light  sedation, the right arm was prepped  with Betadine soap and solution and  sterilely draped.  When her level of consciousness was appropriate, 0.25%  Marcaine and 2% lidocaine were infiltrated at the metacarpal head level at  the ring finger and an injection of Depo-Medrol and lidocaine was provided  into the thumb flexor sheath.  Both injections were technically  satisfactory.  When anesthesia was complete, the arm was exsanguinated with  an Esmarch bandage and arterial tourniquet on the proximal brachium inflated  to 220 mmHg.   The procedure commenced with a short transverse incision directly over the  A1 pulley.  The subcutaneous tissues were carefully divided, revealing the  palmar fascia.  This was released with scissors, followed by isolation of  the pulley and protection of the neurovascular bundles.  The pulley was  split with scissors and a small A0 pulley was released proximally.   Thereafter full range of motion of the finger  was recovered.   The wound was repaired with mattress sutures of 5-0 nylon.  A compressive  dressing was applied to the palm and ring finger site with fluff gauze and  two-inch Ace wrap.   There were no apparent complications.  Ms. Erker tolerated the surgery and  anesthesia well.  She was transferred to the recovery room with stable vital  signs.  For aftercare she is given a prescription for Darvocet-N 100 one or  two tablets p.o. q.4-6h. p.r.n. pain.  She will return to our office for  follow-up in seven to 10 days for suture removal.  She is encouraged to move  her fingers immediately.                                                Katy Fitch Naaman Plummer., M.D.    RVS/MEDQ  D:  08/30/2003  T:  08/31/2003  Job:  [id number]   cc:   Soyla Murphy. Renne Crigler, M.D.  [address]  [place]  [place] [address]  Fax: [phone]

## 2011-04-24 NOTE — Op Note (Signed)
[name], Chelsea Lynch                ACCOUNT NO.:  [id number]   MEDICAL RECORD NO.:  [id number]          PATIENT TYPE:  AMB   LOCATION:  [facility]                         FACILITY:  [facility]   PHYSICIAN:  Ronald L. Earlene Plater, M.D.  DATE OF BIRTH:  07-17-1925   DATE OF PROCEDURE:  07/22/2005  DATE OF DISCHARGE:                                 OPERATIVE REPORT   DIAGNOSIS:  Questionable interstitial cystitis.   OPERATION:  Cystourethroscopy, hydraulic bladder distention, bladder biopsy,  removal of perineal lesion.   SURGEON:  Dr. Gaynelle Arabian.   ANESTHESIA:  LMA.   ESTIMATED BLOOD LOSS:  Negligible.   TUBES:  None.   COMPLICATIONS:  None.   INDICATIONS FOR PROCEDURE:  [name] is a lovely 77 year old white female  who presented with significant frequency of urination and significant  nocturia.  She has rare urge incontinence with some help on Ditropan.  She  was noted in the office on cystourethroscopy to have some diffuse  inflammation throughout the bladder and was suspicious for interstitial  cystitis.  After understanding the risks, benefits and alternatives, she has  elected to proceed.  She also wanted to proceed with removal of a perineal  lesion, and this could be added to the actual procedure, removal of perineal  lesion, and we elected to do so.   PROCEDURE IN DETAIL:  Patient was placed in the supine position.  After  proper LMA anesthesia, was placed in the dorsal lithotomy position and  prepped and draped with Betadine in a sterile fashion.  The right tiny 2 mm  lesion in the right perineum was excised sharply with scissors.  There is no  significant bleeding.  It was submitted to pathology.  Cystourethroscopy was  performed with a 22.5 French Olympus pan endoscope utilizing the 12 and 70  degree lenses.  The bladder was carefully inspected.  Efflux of clear urine  was noted from the normally placed ureteral orifices bilaterally.  The  bladder was filled.  There was a  significant rectocele noted.  Then 80 cc of  water at 400 cc capacity, she began grossly leaking around the cystoscope;  therefore, it was stopped.  The bladder was drained.  She had 400 cc  capacity, although we are not sure it was 80 cm of water pressure due to the  leaking.  There was some mild glomerulations noted posteriorly.  Biopsies  were obtained of the posterior midline with cold cut  biopsies.  Forceps were submitted to pathology.  The base was cauterized  with Bovie electrocoagulation cautery.  No other lesions were noted.  The  bladder was drained.  The pan endoscope was removed.  The patient was taken  to the recovery room stable.      Ronald L. Earlene Plater, M.D.  Electronically Signed     RLD/MEDQ  D:  07/22/2005  T:  07/22/2005  Job:  [id number]

## 2011-04-24 NOTE — Consult Note (Signed)
[facility]  Patient:    Chelsea Lynch, Chelsea Lynch                       MRN: [id number] Proc. Date: 06/06/01 Adm. Date:  [id number] Attending:  Erasmo Leventhal CC:         Kerrin Champagne, M.D.   Consultation Report  CHIEF COMPLAINT:  Leg weakness and back pain.  HISTORY OF PRESENT ILLNESS:  Chelsea Lynch is a 77 year old woman with a history of chronic back pain, seen by Dr. Kerrin Champagne and also by Dr. Marlan Palau in the past.  She has been followed at a chronic pain center and has been on OxyContin.  She has been complaining of worsening back pain for several weeks and then Friday, felt like she could not walk because her legs were weak and numbness.  Weakness and numbness have resolved but the pain continues.  Though she can move her legs well in bed, has not attempted to get up out of bed.  No arm problems but does complain of neck pain.  REVIEW OF SYSTEMS:  Positive for chronic severe back pain and neck pain.  No incontinence.  PAST MEDICAL HISTORY:  Significant for chronic pain syndrome related to her back, hypertension, degenerative disk disease, rectocele.  She has had lumbar surgery twice, neck surgery once, cholecystectomy, appendectomy, knee surgery, hysterectomy.  Has hypothyroidism.  MEDICATIONS:  Solu-Medrol, Neurontin, Micardis, Effexor, Toprol-XL, Protonix, Dilaudid, OxyContin 40 mg q.8h., Synthroid, estradiol, Valium, oxycodone, Reglan, Compazine, Zofran, Benadryl, Ambien, Narcan is ordered as a p.r.n. in case of over-narcotization.  ALLERGIES:  PENICILLIN and CODEINE.  SOCIAL HISTORY:  She is married.  No cigarette or alcohol use.  FAMILY HISTORY:  Noncontributory.  PHYSICAL EXAMINATION:  VITAL SIGNS:  Temperature 99.8, pulse 82, respirations 18, blood pressure 160/90.  HEENT:  Head is normocephalic, atraumatic.  NECK:  Supple.  EXTREMITIES:  Without edema.  NEUROLOGIC:  Mental status exam:  She is awake, alert and  oriented with normal language.  Cranial nerves:  Pupils are equal and reactive.  Visual fields are full to confrontation.  Extraocular movements are intact.  Facial sensation is normal. Facial motor activity is normal.  Hearing is intact.  Palate is symmetric. Tongue is midline.  There is no dysarthria.  Motor exam:  She has normal bulk, tone and strength throughout, including her lower extremities.  While lying in bed, she is moving her legs all around though randomly and spontaneously without any difficulty or complaints of pain.  I am able to get her to sit on the edge of the bed, then stand and then walk with contact assist only, although she says she feels light-headed because she has not been upright for days.  Reflexes:  I cannot elicit deep tendon reflexes in her lower extremities, although I can get 1+ biceps.  She has downgoing toes.  Coordination:  Finger-to-nose and heel-to-shin are intact.  Sensory exam is intact objectively.  IMAGING STUDY:  MRI scan of the lumbar spine showed no severe stenosis or large herniation, although there was a small herniation at L5-S1 and some moderate canal stenosis bilaterally at L3-4.  There is an L4-5 laminectomy site.  CT and myelogram report I cannot find at present but apparently did not show surgically significant changes in her spine.  IMPRESSION: 1. Chronic back pain. 2. Transient leg weakness and numbness of uncertain etiology.  It could be    something like Guillain-Barre, as I  cannot find lower extremity reflexes,    but, in fact, she has no actual weakness present now and that would be    rather of short duration of only two or three days.  I doubt that she has    thoracic spine or cervical spine or brain lesion as there are no    myelopathic findings to suggest upper motor neuron process.  RECOMMENDATION:  Would begin mobilizing with PT.  No other specific recommendations at this time.  If demonstrable myelopathic  findings or persistent leg weakness or numbness occur, MRI scan of the T spine, C spine and brain may help exclude upper motor neuron process.  She might benefit from returning to the pain clinic for reassessment of her medication. DD:  06/06/01 TD:  06/07/01 Job: [id number]

## 2011-04-24 NOTE — Discharge Summary (Signed)
[facility]  Patient:    Chelsea Lynch, Chelsea Lynch                       MRN: [id number] Adm. Date:  [id number] Disc. Date: 06/14/01 Attending:  Erasmo Leventhal Dictator:   Irena Cords, P.A.-C. CC:         Soyla Murphy. Renne Crigler, M.D.  Adelene Amas. Williford, M.D.  Catherine A. Orlin Hilding, M.D.   Discharge Summary  PRINCIPAL DIAGNOSES: 1. Severe low back pain, chronic back pain exacerbation. 2. Depression. 3. Hyponatremia. 4. Hypokalemia.  SECONDARY DIAGNOSIS:  Hypertension.  SURGICAL PROCEDURE:  None.  CONSULTATIONS: 1. Catherine A. Orlin Hilding, M.D., neurology. 2. Adelene Amas. Williford, M.D., psychiatry. 3. Gretta Cool, M.D., GYN. 4. Laurier Nancy, M.D., physical medicine and rehabilitation.  LABORATORY DATA:  Upon admission the patient had a potassium of 2.9, which was treated and improved to 4.6 on the days prior to discharge.  Sodium was 131 on June 29, and stayed around that, but increased to 134 prior to discharge. Sedimentation rate was 1 on admission.  White count was 6.1.  A T4 was 8.2, TSH was 0.944, and a T3 was 122.8.  ALT and ALP were within normal limits at 14 and 92, respectively.  Lumbar myelogram showed no evidence of a central spinal stenosis.  There was a mild anterior defect on the thecal sac at L4-5 level, and mild to moderate impression of the right lateral portion of the thecal sac at L5-S1 due to facet disease.  Post myelogram CT showed small broad based disk bulge at L4-5 and mild to moderate facet disease throughout the lower lumbar spine, but does not appear to cause neuroforaminal narrowing or significant central spinal stenosis.  MRI of the lumbar spine status post right-sided hemilaminectomy L4-5 and L5-S1 level without evidence of a large recurrent disk herniation, significant spinal stenosis, neuroforaminal narrowing, or nerve root compressions.  There was some left-sided L4-5 facet degenerative changes.  There was  an L3-4 bilateral facet degenerative changes with no significant neurologic compromise.  Plain radiographs showed some mild scoliotic deformity of the lumbar spine and degenerative disc disease pronounced at L4-5 and L5-S1.  HOSPITAL COURSE:  The patient was initially admitted to Dr. Thomasena Edis service for severe exacerbation of back pain.  Dr. Otelia Sergeant was also involved in the patients care from the date of her admission.  Studies, including an MRI, CT, and a post CT myelogram were ordered, but showed no significant central spinal stenosis or neuroforaminal narrowing.  No surgical indications or findings. GYN was consulted, Dr. Nicholas Lose, for ______ insertion on the second hospital day.  We had extreme difficulty controlling the patients pain initially, and medications were adjusted throughout her hospital stay.  The patient was noted to have hyponatremia and hypokalemia on admission.  Her potassium was increased which resolved her hypokalemia.  Fluid restriction was started which just had a minimal impact on the patients hyponatremia.  She remained asymptomatic from both though.  Dr. Marcelino Freestone of neurology was consulted, as the patient did have some subjective lower extremity weakness and numbness.  She had no specific recommendations at this time, as she found no neurologic findings of persistent weakness, numbness, or tingling.  She recommended beginning mobilization when the patient returns to the pain clinic.  Also if her symptoms persistent, would recommend repeating her studies.  Physical therapy was consulted.  The patient progressed slowly with therapy initially.  Dr. Jeanie Sewer in psychiatry was consulted.  He  felt the patient would benefit from increasing her Effexor XR as well as visiting the psychotherapy clinic upon discharge, doing this as an outpatient.  Valium was increased as well.  Dr. Johna Roles of physical medicine rehabilitation was consulted for consideration of  inpatient rehab.  He felt the patient was not an acceptable candidate for inpatient rehab.  Therefore, discharge planning was consulted for arrangements of home health physical therapy.  On June 07, 2001, primary service was switched over from Dr. Thomasena Edis to Dr. Otelia Sergeant. Arrangements were also being made to obtain a new four lead TENS unit for the patient, as she had an old two lead that had quite worn out.  By June 14, 2001, the patient had stabilized, her pain had improved, and she was ready for discharge home.  The patient was ambulating upwards of 150 feet with the assistance of a walker independently prior to discharge.  DISPOSITION:  The patient will be discharged home.  DIET:  Resume a regular diet.  ACTIVITY:  She may weight bear as tolerated with the assistance of a walker. She is to have no heavy lifting, bending, or stooping.  Utilize a four lead TENS unit, this is being arranged to be delivered to the patient.  She will have home health physical therapy.  FOLLOWUP: 1. Dr. Otelia Sergeant in one week.  She is to call [phone] for an appointment. 2. Follow up with Dr. Merri Brunette in one week for hyponatremia and    hypokalemia followup.  She is to call for an appointment. 3. Follow up with Dr. Jeanie Sewer in psychiatry in the next one to two weeks for    consideration about the patients psychotherapy.  She is to call that    office for an appointment. 4. Follow up with Dr. Alfonse Flavors in [place] at the [facility] for    pain control.  She has an August appointment.  DISCHARGE MEDICATIONS: 1. OxyContin 80 mg one p.o. q.8h. 2. Oxy IR 5 to 10 mg p.o. q.4-6h. p.r.n. breakthrough pain. 3. Valium 7.5 mg p.o. t.i.d. 4. Vioxx 25 mg p.o. q.d. 5. Effexor XR 112.5 mg p.o. q.a.m. 6. Neurontin 300 mg p.o. t.i.d. with meals and 600 mg p.o. q.h.s. 7. Toprol XL 50 mg p.o. q.d. 8. Micardis 40 mg p.o. q.d. 9. Prevacid one p.o. b.i.d.   CONDITION ON DISCHARGE:  Good and improved. DD:   06/14/01 TD:  06/14/01 Job: [id number]

## 2011-08-03 ENCOUNTER — Other Ambulatory Visit: Payer: Self-pay | Admitting: Gynecology

## 2011-08-03 DIAGNOSIS — Z1231 Encounter for screening mammogram for malignant neoplasm of breast: Secondary | ICD-10-CM

## 2011-08-07 ENCOUNTER — Ambulatory Visit: Payer: Medicare Other

## 2011-08-11 ENCOUNTER — Ambulatory Visit
Admission: RE | Admit: 2011-08-11 | Discharge: 2011-08-11 | Disposition: A | Discharge status: Home / Self Care | Payer: Medicare Other | Source: Ambulatory Visit | Attending: Gynecology | Admitting: Gynecology

## 2011-08-11 DIAGNOSIS — Z1231 Encounter for screening mammogram for malignant neoplasm of breast: Secondary | ICD-10-CM

## 2011-11-16 ENCOUNTER — Other Ambulatory Visit: Payer: Self-pay | Admitting: Gynecology

## 2011-11-16 DIAGNOSIS — N63 Unspecified lump in unspecified breast: Secondary | ICD-10-CM

## 2011-11-27 ENCOUNTER — Ambulatory Visit
Admission: RE | Admit: 2011-11-27 | Discharge: 2011-11-27 | Disposition: A | Discharge status: Home / Self Care | Payer: Medicare Other | Source: Ambulatory Visit | Attending: Gynecology | Admitting: Gynecology

## 2011-11-27 DIAGNOSIS — N63 Unspecified lump in unspecified breast: Secondary | ICD-10-CM

## 2012-02-25 HISTORY — PX: OTHER SURGICAL HISTORY: SHX169

## 2012-05-10 ENCOUNTER — Other Ambulatory Visit: Payer: Self-pay | Admitting: Dermatology

## 2012-09-02 ENCOUNTER — Other Ambulatory Visit: Payer: Self-pay | Admitting: Neurology

## 2012-09-02 DIAGNOSIS — N3941 Urge incontinence: Secondary | ICD-10-CM

## 2012-09-02 DIAGNOSIS — F29 Unspecified psychosis not due to a substance or known physiological condition: Secondary | ICD-10-CM

## 2012-10-04 ENCOUNTER — Other Ambulatory Visit: Payer: Self-pay | Admitting: Gynecology

## 2012-10-04 DIAGNOSIS — Z1231 Encounter for screening mammogram for malignant neoplasm of breast: Secondary | ICD-10-CM

## 2012-11-10 ENCOUNTER — Ambulatory Visit: Payer: Medicare Other

## 2013-03-25 ENCOUNTER — Emergency Department (HOSPITAL_COMMUNITY): Payer: Medicare Other

## 2013-03-25 ENCOUNTER — Encounter (HOSPITAL_COMMUNITY): Payer: Self-pay

## 2013-03-25 ENCOUNTER — Inpatient Hospital Stay (HOSPITAL_COMMUNITY)
Admission: EM | Admit: 2013-03-25 | Discharge: 2013-04-06 | DRG: 871 | Disposition: A | Discharge status: Home Health | Payer: Medicare Other | Attending: Internal Medicine | Admitting: Internal Medicine

## 2013-03-25 ENCOUNTER — Other Ambulatory Visit: Payer: Self-pay

## 2013-03-25 DIAGNOSIS — F19939 Other psychoactive substance use, unspecified with withdrawal, unspecified: Secondary | ICD-10-CM | POA: Diagnosis present

## 2013-03-25 DIAGNOSIS — E039 Hypothyroidism, unspecified: Secondary | ICD-10-CM | POA: Diagnosis present

## 2013-03-25 DIAGNOSIS — N179 Acute kidney failure, unspecified: Secondary | ICD-10-CM | POA: Diagnosis present

## 2013-03-25 DIAGNOSIS — E876 Hypokalemia: Secondary | ICD-10-CM | POA: Diagnosis present

## 2013-03-25 DIAGNOSIS — A419 Sepsis, unspecified organism: Secondary | ICD-10-CM | POA: Diagnosis present

## 2013-03-25 DIAGNOSIS — R6521 Severe sepsis with septic shock: Secondary | ICD-10-CM | POA: Diagnosis present

## 2013-03-25 DIAGNOSIS — R7989 Other specified abnormal findings of blood chemistry: Secondary | ICD-10-CM | POA: Diagnosis present

## 2013-03-25 DIAGNOSIS — A4150 Gram-negative sepsis, unspecified: Principal | ICD-10-CM | POA: Diagnosis present

## 2013-03-25 DIAGNOSIS — G92 Toxic encephalopathy: Secondary | ICD-10-CM | POA: Diagnosis present

## 2013-03-25 DIAGNOSIS — Z79899 Other long term (current) drug therapy: Secondary | ICD-10-CM

## 2013-03-25 DIAGNOSIS — I471 Supraventricular tachycardia, unspecified: Secondary | ICD-10-CM | POA: Diagnosis present

## 2013-03-25 DIAGNOSIS — R932 Abnormal findings on diagnostic imaging of liver and biliary tract: Secondary | ICD-10-CM

## 2013-03-25 DIAGNOSIS — G8929 Other chronic pain: Secondary | ICD-10-CM | POA: Diagnosis present

## 2013-03-25 DIAGNOSIS — R4182 Altered mental status, unspecified: Secondary | ICD-10-CM

## 2013-03-25 DIAGNOSIS — K5901 Slow transit constipation: Secondary | ICD-10-CM

## 2013-03-25 DIAGNOSIS — Z888 Allergy status to other drugs, medicaments and biological substances status: Secondary | ICD-10-CM

## 2013-03-25 DIAGNOSIS — D649 Anemia, unspecified: Secondary | ICD-10-CM | POA: Diagnosis present

## 2013-03-25 DIAGNOSIS — I959 Hypotension, unspecified: Secondary | ICD-10-CM

## 2013-03-25 DIAGNOSIS — R9389 Abnormal findings on diagnostic imaging of other specified body structures: Secondary | ICD-10-CM

## 2013-03-25 DIAGNOSIS — M549 Dorsalgia, unspecified: Secondary | ICD-10-CM | POA: Diagnosis present

## 2013-03-25 DIAGNOSIS — G929 Unspecified toxic encephalopathy: Secondary | ICD-10-CM | POA: Diagnosis present

## 2013-03-25 DIAGNOSIS — R7881 Bacteremia: Secondary | ICD-10-CM | POA: Diagnosis present

## 2013-03-25 DIAGNOSIS — R1310 Dysphagia, unspecified: Secondary | ICD-10-CM | POA: Diagnosis present

## 2013-03-25 DIAGNOSIS — J690 Pneumonitis due to inhalation of food and vomit: Secondary | ICD-10-CM | POA: Diagnosis present

## 2013-03-25 DIAGNOSIS — K219 Gastro-esophageal reflux disease without esophagitis: Secondary | ICD-10-CM | POA: Diagnosis present

## 2013-03-25 DIAGNOSIS — N189 Chronic kidney disease, unspecified: Secondary | ICD-10-CM | POA: Diagnosis present

## 2013-03-25 DIAGNOSIS — I129 Hypertensive chronic kidney disease with stage 1 through stage 4 chronic kidney disease, or unspecified chronic kidney disease: Secondary | ICD-10-CM | POA: Diagnosis present

## 2013-03-25 DIAGNOSIS — N12 Tubulo-interstitial nephritis, not specified as acute or chronic: Secondary | ICD-10-CM | POA: Diagnosis present

## 2013-03-25 DIAGNOSIS — R0902 Hypoxemia: Secondary | ICD-10-CM | POA: Diagnosis present

## 2013-03-25 DIAGNOSIS — F329 Major depressive disorder, single episode, unspecified: Secondary | ICD-10-CM | POA: Diagnosis present

## 2013-03-25 DIAGNOSIS — F411 Generalized anxiety disorder: Secondary | ICD-10-CM | POA: Diagnosis present

## 2013-03-25 DIAGNOSIS — N289 Disorder of kidney and ureter, unspecified: Secondary | ICD-10-CM

## 2013-03-25 DIAGNOSIS — I214 Non-ST elevation (NSTEMI) myocardial infarction: Secondary | ICD-10-CM | POA: Diagnosis present

## 2013-03-25 DIAGNOSIS — I4891 Unspecified atrial fibrillation: Secondary | ICD-10-CM | POA: Diagnosis present

## 2013-03-25 DIAGNOSIS — K2900 Acute gastritis without bleeding: Secondary | ICD-10-CM

## 2013-03-25 DIAGNOSIS — F1193 Opioid use, unspecified with withdrawal: Secondary | ICD-10-CM | POA: Diagnosis present

## 2013-03-25 DIAGNOSIS — G4700 Insomnia, unspecified: Secondary | ICD-10-CM | POA: Diagnosis present

## 2013-03-25 DIAGNOSIS — G9341 Metabolic encephalopathy: Secondary | ICD-10-CM

## 2013-03-25 DIAGNOSIS — F1120 Opioid dependence, uncomplicated: Secondary | ICD-10-CM | POA: Diagnosis present

## 2013-03-25 DIAGNOSIS — F0390 Unspecified dementia without behavioral disturbance: Secondary | ICD-10-CM | POA: Diagnosis present

## 2013-03-25 DIAGNOSIS — Z7982 Long term (current) use of aspirin: Secondary | ICD-10-CM

## 2013-03-25 DIAGNOSIS — R410 Disorientation, unspecified: Secondary | ICD-10-CM

## 2013-03-25 DIAGNOSIS — K5730 Diverticulosis of large intestine without perforation or abscess without bleeding: Secondary | ICD-10-CM

## 2013-03-25 DIAGNOSIS — F1123 Opioid dependence with withdrawal: Secondary | ICD-10-CM

## 2013-03-25 DIAGNOSIS — G459 Transient cerebral ischemic attack, unspecified: Secondary | ICD-10-CM

## 2013-03-25 DIAGNOSIS — F3289 Other specified depressive episodes: Secondary | ICD-10-CM | POA: Diagnosis present

## 2013-03-25 DIAGNOSIS — K5900 Constipation, unspecified: Secondary | ICD-10-CM | POA: Diagnosis present

## 2013-03-25 DIAGNOSIS — Z880 Allergy status to penicillin: Secondary | ICD-10-CM

## 2013-03-25 HISTORY — DX: Hypothyroidism, unspecified: E03.9

## 2013-03-25 HISTORY — DX: Gastro-esophageal reflux disease without esophagitis: K21.9

## 2013-03-25 HISTORY — DX: Other chronic pain: G89.29

## 2013-03-25 HISTORY — DX: Major depressive disorder, single episode, unspecified: F32.9

## 2013-03-25 HISTORY — DX: Depression, unspecified: F32.A

## 2013-03-25 HISTORY — DX: Insomnia, unspecified: G47.00

## 2013-03-25 HISTORY — DX: Anxiety disorder, unspecified: F41.9

## 2013-03-25 HISTORY — DX: Other constipation: K59.09

## 2013-03-25 HISTORY — DX: Polyneuropathy, unspecified: G62.9

## 2013-03-25 LAB — COMPREHENSIVE METABOLIC PANEL
BUN: 30 mg/dL — ABNORMAL HIGH (ref 6–23)
CO2: 23 mEq/L (ref 19–32)
Chloride: 98 mEq/L (ref 96–112)
Creatinine, Ser: 1.81 mg/dL — ABNORMAL HIGH (ref 0.50–1.10)
GFR calc non Af Amer: 24 mL/min — ABNORMAL LOW (ref >90)
Total Bilirubin: 0.7 mg/dL (ref 0.3–1.2)

## 2013-03-25 LAB — URINALYSIS, ROUTINE W REFLEX MICROSCOPIC
Protein, ur: 100 mg/dL — AB
Urobilinogen, UA: 0.2 mg/dL (ref 0.0–1.0)

## 2013-03-25 LAB — CBC WITH DIFFERENTIAL/PLATELET
Basophils Relative: 0 % (ref 0–1)
HCT: 33.8 % — ABNORMAL LOW (ref 36.0–46.0)
Hemoglobin: 11.9 g/dL — ABNORMAL LOW (ref 12.0–15.0)
Lymphocytes Relative: 5 % — ABNORMAL LOW (ref 12–46)
MCHC: 35.2 g/dL (ref 30.0–36.0)
MCV: 87.1 fL (ref 78.0–100.0)
Monocytes Absolute: 0.7 10*3/uL (ref 0.1–1.0)
Monocytes Relative: 7 % (ref 3–12)
Neutro Abs: 9.6 10*3/uL — ABNORMAL HIGH (ref 1.7–7.7)

## 2013-03-25 LAB — RAPID URINE DRUG SCREEN, HOSP PERFORMED
Barbiturates: NOT DETECTED
Tetrahydrocannabinol: NOT DETECTED

## 2013-03-25 LAB — POCT I-STAT, CHEM 8
BUN: 30 mg/dL — ABNORMAL HIGH (ref 6–23)
Calcium, Ion: 1.12 mmol/L — ABNORMAL LOW (ref 1.13–1.30)
Glucose, Bld: 103 mg/dL — ABNORMAL HIGH (ref 70–99)
TCO2: 23 mmol/L (ref 0–100)

## 2013-03-25 LAB — D-DIMER, QUANTITATIVE: D-Dimer, Quant: 3.8 ug/mL-FEU — ABNORMAL HIGH (ref 0.00–0.48)

## 2013-03-25 LAB — LACTIC ACID, PLASMA: Lactic Acid, Venous: 2 mmol/L (ref 0.5–2.2)

## 2013-03-25 LAB — SALICYLATE LEVEL: Salicylate Lvl: <2 mg/dL — ABNORMAL LOW (ref 2.8–20.0)

## 2013-03-25 LAB — POCT I-STAT TROPONIN I: Troponin i, poc: 0.5 ng/mL (ref 0.00–0.08)

## 2013-03-25 LAB — URINE MICROSCOPIC-ADD ON

## 2013-03-25 MED ORDER — DEXTROSE 5 % IV SOLN
1.0000 g | Freq: Once | INTRAVENOUS | Status: AC
  Administered 2013-03-26: 1 g via INTRAVENOUS
  Filled 2013-03-25: qty 10

## 2013-03-25 MED ORDER — ADENOSINE 6 MG/2ML IV SOLN
12.0000 mg | Freq: Once | INTRAVENOUS | Status: AC
  Administered 2013-03-25: 12 mg via INTRAVENOUS

## 2013-03-25 MED ORDER — ADENOSINE 6 MG/2ML IV SOLN
INTRAVENOUS | Status: AC
  Filled 2013-03-25: qty 4

## 2013-03-25 MED ORDER — ADENOSINE 6 MG/2ML IV SOLN
6.0000 mg | Freq: Once | INTRAVENOUS | Status: AC
  Administered 2013-03-25: 6 mg via INTRAVENOUS

## 2013-03-25 NOTE — ED Provider Notes (Signed)
History     CSN: [id number]  Arrival date & time 03/25/13  2017   First MD Initiated Contact with Patient 03/25/13 2031      Chief Complaint  Patient presents with  . Tachycardia    (Consider location/radiation/quality/duration/timing/severity/associated sxs/prior treatment) Patient is a 79 y.o. female presenting with altered mental status. The history is provided by the EMS personnel and the spouse (Caregivers).  Altered Mental Status This is a new (Patient was brought in by EMS with rapid heartbeat. Her caregivers noted that she had altered mental status and fever, and was therefore brought to [facility] [place] for evaluation. She is on medications for chronic pain as well as for hypothyroidism, hyperten) problem. The current episode started 3 to 5 hours ago. The problem occurs constantly. The problem has been rapidly worsening. Pertinent negatives include no chest pain, no abdominal pain, no headaches and no shortness of breath. Nothing aggravates the symptoms. Nothing relieves the symptoms. Treatments tried: Patient brought to [facility] via EMS. They had tried carotid massage with some brief breaking of her rapid heartbeat.    Past Medical History  Diagnosis Date  . Chronic back pain   . Hypothyroid   . GERD (gastroesophageal reflux disease)   . Depression   . Neuropathy   . Constipation, chronic   . Insomnia   . Anxiety   . Chronic pain     History reviewed. No pertinent past surgical history.  No family history on file.  History  Substance Use Topics  . Smoking status: Never Smoker   . Smokeless tobacco: Never Used  . Alcohol Use: No    OB History   Grav Para Term Preterm Abortions TAB SAB Ect Mult Living                  Review of Systems  Unable to perform ROS: Mental status change  Respiratory: Negative for shortness of breath.   Cardiovascular: Negative for chest pain.  Gastrointestinal: Negative for abdominal pain.  Neurological: Negative for  headaches.  Psychiatric/Behavioral: Positive for altered mental status.    Allergies  Clarithromycin; Codeine; Penicillins; and Tequin  Home Medications   Current Outpatient Rx  Name  Route  Sig  Dispense  Refill  . aspirin 81 MG tablet   Oral   Take 81 mg by mouth daily.         . Biotin 5000 MCG CAPS   Oral   Take 5,000 mcg by mouth daily.         Marland Kitchen docusate sodium (COLACE) 100 MG capsule   Oral   Take 100 mg by mouth daily with supper.         . fish oil-omega-3 fatty acids 1000 MG capsule   Oral   Take 2 g by mouth daily.         . furosemide (LASIX) 20 MG tablet   Oral   Take 20 mg by mouth daily.         Marland Kitchen gabapentin (NEURONTIN) 400 MG capsule   Oral   Take 400 mg by mouth 3 (three) times daily.         Marland Kitchen levothyroxine (SYNTHROID, LEVOTHROID) 125 MCG tablet   Oral   Take 125 mcg by mouth daily before breakfast.         . Linaclotide (LINZESS) 145 MCG CAPS   Oral   Take 145 mcg by mouth every evening.         . metoprolol succinate (TOPROL-XL)  25 MG 24 hr tablet   Oral   Take 37.5 mg by mouth daily.         . mirabegron ER (MYRBETRIQ) 50 MG TB24   Oral   Take 50 mg by mouth daily.         Marland Kitchen omeprazole (PRILOSEC) 20 MG capsule   Oral   Take 20 mg by mouth daily before breakfast.         . oxyCODONE (OXYCONTIN) 10 MG 12 hr tablet   Oral   Take 10 mg by mouth every 12 (twelve) hours.         Marland Kitchen oxyCODONE-acetaminophen (PERCOCET/ROXICET) 5-325 MG per tablet   Oral   Take 1 tablet by mouth every 8 (eight) hours as needed for pain.         . potassium chloride (K-DUR,KLOR-CON) 10 MEQ tablet   Oral   Take 10 mEq by mouth every other day.         . traZODone (DESYREL) 50 MG tablet   Oral   Take 100 mg by mouth at bedtime.         Marland Kitchen trimethoprim (TRIMPEX) 100 MG tablet   Oral   Take 100 mg by mouth at bedtime. For bladder         . venlafaxine XR (EFFEXOR-XR) 75 MG 24 hr capsule   Oral   Take 75 mg by mouth 2  (two) times daily.         . Vitamin D, Ergocalciferol, (DRISDOL) 50000 UNITS CAPS   Oral   Take 50,000 Units by mouth every 7 (seven) days.         . zaleplon (SONATA) 10 MG capsule   Oral   Take 10 mg by mouth at bedtime.           BP 71/46  Pulse 94  Temp(Src) 99.1 F (37.3 C) (Oral)  Resp 21  SpO2 83%  Physical Exam  Nursing note and vitals reviewed. Constitutional:  Ill-appearing elderly woman with heart rate of 180.  HENT:  Head: Normocephalic and atraumatic.  Right Ear: External ear normal.  Left Ear: External ear normal.  Mouth/Throat: Oropharynx is clear and moist.  Eyes: Conjunctivae and EOM are normal. Pupils are equal, round, and reactive to light. No scleral icterus.  Neck: Normal range of motion. Neck supple.  Cardiovascular:  Tachycardia, no murmur.   Pulmonary/Chest: Effort normal and breath sounds normal.  Abdominal: Soft. Bowel sounds are normal.  Musculoskeletal: Normal range of motion. She exhibits no edema and no tenderness.  Neurological:  Weakly arousable.  No apparent sensory or moor deficit.  Skin: Skin is warm and dry.  Psychiatric:  Unable to assess.    ED Course  CRITICAL CARE Performed by: Osvaldo Human Authorized by: Osvaldo Human Total critical care time: 60 minutes Critical care was necessary to treat or prevent imminent or life-threatening deterioration of the following conditions: Elderly woman came in with altered mental status, had SVT and hypotension, treated with IV adenosine, then with IV fluids for hypotension.  Workup showed she had a UTI --> Rx with IV Rocephin.  Critical Care called to admit her to ICU.   Critical care was time spent personally by me on the following activities: discussions with consultants, evaluation of patient's response to treatment, examination of patient, obtaining history from patient or surrogate, ordering and performing treatments and interventions, ordering and review of laboratory  studies, ordering and review of radiographic studies, pulse oximetry and re-evaluation of  patient's condition.   (including critical care time)   Results for orders placed during the hospital encounter of 03/25/13  CBC WITH DIFFERENTIAL      Result Value Range   WBC 10.9 (*) 4.0 - 10.5 K/uL   RBC 3.88  3.87 - 5.11 MIL/uL   Hemoglobin 11.9 (*) 12.0 - 15.0 g/dL   HCT 16.1 (*) 09.6 - 04.5 %   MCV 87.1  78.0 - 100.0 fL   MCH 30.7  26.0 - 34.0 pg   MCHC 35.2  30.0 - 36.0 g/dL   RDW 40.9  81.1 - 91.4 %   Platelets 151  150 - 400 K/uL   Neutrophils Relative 88 (*) 43 - 77 %   Neutro Abs 9.6 (*) 1.7 - 7.7 K/uL   Lymphocytes Relative 5 (*) 12 - 46 %   Lymphs Abs 0.5 (*) 0.7 - 4.0 K/uL   Monocytes Relative 7  3 - 12 %   Monocytes Absolute 0.7  0.1 - 1.0 K/uL   Eosinophils Relative 0  0 - 5 %   Eosinophils Absolute 0.0  0.0 - 0.7 K/uL   Basophils Relative 0  0 - 1 %   Basophils Absolute 0.0  0.0 - 0.1 K/uL  COMPREHENSIVE METABOLIC PANEL      Result Value Range   Sodium 132 (*) 135 - 145 mEq/L   Potassium 3.5  3.5 - 5.1 mEq/L   Chloride 98  96 - 112 mEq/L   CO2 23  19 - 32 mEq/L   Glucose, Bld 101 (*) 70 - 99 mg/dL   BUN 30 (*) 6 - 23 mg/dL   Creatinine, Ser 7.82 (*) 0.50 - 1.10 mg/dL   Calcium 8.7  8.4 - 95.6 mg/dL   Total Protein 6.6  6.0 - 8.3 g/dL   Albumin 3.1 (*) 3.5 - 5.2 g/dL   AST 16  0 - 37 U/L   ALT 10  0 - 35 U/L   Alkaline Phosphatase 87  39 - 117 U/L   Total Bilirubin 0.7  0.3 - 1.2 mg/dL   GFR calc non Af Amer 24 (*) >90 mL/min   GFR calc Af Amer 28 (*) >90 mL/min  URINALYSIS, ROUTINE W REFLEX MICROSCOPIC      Result Value Range   Color, Urine YELLOW  YELLOW   APPearance TURBID (*) CLEAR   Specific Gravity, Urine 1.017  1.005 - 1.030   pH 5.5  5.0 - 8.0   Glucose, UA NEGATIVE  NEGATIVE mg/dL   Hgb urine dipstick MODERATE (*) NEGATIVE   Bilirubin Urine NEGATIVE  NEGATIVE   Ketones, ur NEGATIVE  NEGATIVE mg/dL   Protein, ur 213 (*) NEGATIVE mg/dL    Urobilinogen, UA 0.2  0.0 - 1.0 mg/dL   Nitrite NEGATIVE  NEGATIVE   Leukocytes, UA LARGE (*) NEGATIVE  D-DIMER, QUANTITATIVE      Result Value Range   D-Dimer, Quant 3.80 (*) 0.00 - 0.48 ug/mL-FEU  PRO B NATRIURETIC PEPTIDE      Result Value Range   Pro B Natriuretic peptide (BNP) 7189.0 (*) 0 - 450 pg/mL  ETHANOL      Result Value Range   Alcohol, Ethyl (B) <11  0 - 11 mg/dL  ACETAMINOPHEN LEVEL      Result Value Range   Acetaminophen (Tylenol), Serum <15.0  10 - 30 ug/mL  SALICYLATE LEVEL      Result Value Range   Salicylate Lvl <2.0 (*) 2.8 - 20.0 mg/dL  LACTIC ACID, PLASMA  Result Value Range   Lactic Acid, Venous 2.0  0.5 - 2.2 mmol/L  URINE MICROSCOPIC-ADD ON      Result Value Range   Squamous Epithelial / LPF FEW (*) RARE   WBC, UA 21-50  <3 WBC/hpf   RBC / HPF 3-6  <3 RBC/hpf   Bacteria, UA MANY (*) RARE  POCT I-STAT, CHEM 8      Result Value Range   Sodium 136  135 - 145 mEq/L   Potassium 3.5  3.5 - 5.1 mEq/L   Chloride 103  96 - 112 mEq/L   BUN 30 (*) 6 - 23 mg/dL   Creatinine, Ser 2.95 (*) 0.50 - 1.10 mg/dL   Glucose, Bld 621 (*) 70 - 99 mg/dL   Calcium, Ion 3.08 (*) 1.13 - 1.30 mmol/L   TCO2 23  0 - 100 mmol/L   Hemoglobin 12.6  12.0 - 15.0 g/dL   HCT 65.7  84.6 - 96.2 %  POCT I-STAT TROPONIN I      Result Value Range   Troponin i, poc 0.50 (*) 0.00 - 0.08 ng/mL   Comment NOTIFIED PHYSICIAN     Comment 3            Dg Chest Port 1 View  03/25/2013  *RADIOLOGY REPORT*  Clinical Data: 79 year old female altered mental status shortness of breath.  PORTABLE CHEST - 1 VIEW  Comparison: Thoracic spine CT 12/31/2010.  CT abdomen 01/17/2010.  Findings: Portable semi upright AP view at 2039 hours.  Stable to mildly lower lung volumes.  Mild left basilar opacity.  Stable cardiac size and mediastinal contours.  Chronic thoracic spinal stimulator lead appears stable.  Stable right upper quadrant surgical clips. Visualized tracheal air column is within normal limits.   Chronic deficiency of the distal right clavicle. No pneumothorax.  No pleural effusion identified.  IMPRESSION: Mildly lower lung volumes.  Patchy left lung base opacity, favor atelectasis but left lung base pneumonia not excluded.   Original Report Authenticated By: Erskine Speed, M.D.    8:20 P.M.  Adenosine 6 mg IV with transient slowing. 8:25 P.M.  Adenosine 12 mg IV with conversion to sinus rhythm.     Date: 03/25/2013 8:16 P.M.  Rate: 183  Rhythm: Supraventricular tachycardia.  QRS Axis: normal  Intervals: QT prolonged  ST/T Wave abnormalities: ST depressions inferiorly and ST depressions laterally  Conduction Disutrbances:none  Narrative Interpretation: Abnormal EKG  Old EKG Reviewed: changes noted--was in sinus rhythm on 02/07/2010.   Date: 03/26/2013 8:25 P.M.  Rate: 106  Rhythm: sinus tachycardia  QRS Axis: normal  Intervals: normal  ST/T Wave abnormalities: ST depressions inferiorly and ST depressions laterally  Conduction Disutrbances:none  Narrative Interpretation: Borderline EKG  Old EKG Reviewed: changes noted--has converted from SVT.     Course in ED:  Pt had SVT on arrival and had conversion with adenosine.  She remained hypotensive and was given a total of 4 liters of normal saline, with BP up into the high 90s systolic.  Lab workup showed a UTI.  BNP was high at 7189, raising concern for pushing her into CHF.  Call to PCCM to see and admit pt.  Rx IV with Rocephin for her UTI after discussion of antibiotic choice with pharmacist.  .   12:09 AM Rate back up to 160.  Will try metoprolol 5 mg IV.   1. Urosepsis   2. Supraventricular tachycardia   3. Hypotension            Hessie Diener  Marcia Brash, MD 03/26/13 0010

## 2013-03-25 NOTE — ED Notes (Signed)
Patient presents from home. Per caregiver, patient had been c/o lower abdominal pain and vomited bile earlier today. Later noticed that patient "wasn't acting right." EMS called and found patient to be in SVT with HR 200s. EMS attempted vagal maneuvers and HR decreased to 120s. At ED arrival, patient's HR back up to 180s. Pt diaphoretic. Denies CP or SOB. AAOx4.

## 2013-03-26 ENCOUNTER — Emergency Department (HOSPITAL_COMMUNITY): Payer: Medicare Other

## 2013-03-26 ENCOUNTER — Encounter (HOSPITAL_COMMUNITY): Payer: Self-pay | Admitting: Nurse Practitioner

## 2013-03-26 DIAGNOSIS — I498 Other specified cardiac arrhythmias: Secondary | ICD-10-CM

## 2013-03-26 DIAGNOSIS — N390 Urinary tract infection, site not specified: Secondary | ICD-10-CM

## 2013-03-26 DIAGNOSIS — R0902 Hypoxemia: Secondary | ICD-10-CM

## 2013-03-26 DIAGNOSIS — I959 Hypotension, unspecified: Secondary | ICD-10-CM

## 2013-03-26 DIAGNOSIS — R6520 Severe sepsis without septic shock: Secondary | ICD-10-CM

## 2013-03-26 DIAGNOSIS — K2900 Acute gastritis without bleeding: Secondary | ICD-10-CM

## 2013-03-26 DIAGNOSIS — A419 Sepsis, unspecified organism: Secondary | ICD-10-CM

## 2013-03-26 LAB — PROTIME-INR: Prothrombin Time: 15.1 seconds (ref 11.6–15.2)

## 2013-03-26 LAB — TSH: TSH: 0.532 u[IU]/mL (ref 0.350–4.500)

## 2013-03-26 LAB — CBC
HCT: 28.4 % — ABNORMAL LOW (ref 36.0–46.0)
Hemoglobin: 10.6 g/dL — ABNORMAL LOW (ref 12.0–15.0)
MCH: 30.7 pg (ref 26.0–34.0)
MCV: 88.2 fL (ref 78.0–100.0)
Platelets: 164 10*3/uL (ref 150–400)
RBC: 3.45 MIL/uL — ABNORMAL LOW (ref 3.87–5.11)
RDW: 13.9 % (ref 11.5–15.5)
WBC: 18.6 10*3/uL — ABNORMAL HIGH (ref 4.0–10.5)
WBC: 16.3 10*3/uL — ABNORMAL HIGH (ref 4.0–10.5)

## 2013-03-26 LAB — BASIC METABOLIC PANEL
Calcium: 7.9 mg/dL — ABNORMAL LOW (ref 8.4–10.5)
GFR calc Af Amer: 30 mL/min — ABNORMAL LOW (ref >90)
GFR calc non Af Amer: 26 mL/min — ABNORMAL LOW (ref >90)
Glucose, Bld: 136 mg/dL — ABNORMAL HIGH (ref 70–99)
Potassium: 4.3 mEq/L (ref 3.5–5.1)
Sodium: 136 mEq/L (ref 135–145)

## 2013-03-26 LAB — BLOOD GAS, ARTERIAL
Bicarbonate: 18.4 mEq/L — ABNORMAL LOW (ref 20.0–24.0)
FIO2: 0.3 %
O2 Saturation: 92.8 %
Patient temperature: 100.4
pH, Arterial: 7.285 — ABNORMAL LOW (ref 7.350–7.450)

## 2013-03-26 LAB — ABO/RH: ABO/RH(D): A POS

## 2013-03-26 LAB — TYPE AND SCREEN

## 2013-03-26 LAB — POCT I-STAT 3, ART BLOOD GAS (G3+)
Bicarbonate: 18.6 mEq/L — ABNORMAL LOW (ref 20.0–24.0)
pH, Arterial: 7.34 — ABNORMAL LOW (ref 7.350–7.450)
pO2, Arterial: 105 mmHg — ABNORMAL HIGH (ref 80.0–100.0)

## 2013-03-26 LAB — CORTISOL: Cortisol, Plasma: 18.1 ug/dL

## 2013-03-26 LAB — PHOSPHORUS: Phosphorus: 3.7 mg/dL (ref 2.3–4.6)

## 2013-03-26 LAB — CREATININE, SERUM
Creatinine, Ser: 1.73 mg/dL — ABNORMAL HIGH (ref 0.50–1.10)
GFR calc Af Amer: 30 mL/min — ABNORMAL LOW (ref >90)

## 2013-03-26 LAB — MAGNESIUM: Magnesium: 1.5 mg/dL (ref 1.5–2.5)

## 2013-03-26 LAB — MRSA PCR SCREENING: MRSA by PCR: NEGATIVE

## 2013-03-26 MED ORDER — ADENOSINE 6 MG/2ML IV SOLN
INTRAVENOUS | Status: AC
  Administered 2013-03-26: 6 mg
  Filled 2013-03-26: qty 2

## 2013-03-26 MED ORDER — SENNOSIDES-DOCUSATE SODIUM 8.6-50 MG PO TABS
1.0000 | ORAL_TABLET | Freq: Two times a day (BID) | ORAL | Status: DC
  Administered 2013-03-26 – 2013-04-06 (×17): 1 via ORAL
  Filled 2013-03-26 (×23): qty 1

## 2013-03-26 MED ORDER — ASPIRIN 81 MG PO TABS: 81.0000 mg | ORAL_TABLET | Freq: Every day | ORAL | Status: DC

## 2013-03-26 MED ORDER — TRAZODONE HCL 100 MG PO TABS
100.0000 mg | ORAL_TABLET | Freq: Every day | ORAL | Status: DC
  Administered 2013-03-26 – 2013-04-05 (×10): 100 mg via ORAL
  Filled 2013-03-26 (×12): qty 1

## 2013-03-26 MED ORDER — DOCUSATE SODIUM 283 MG RE ENEM
1.0000 | ENEMA | Freq: Every day | RECTAL | Status: DC | PRN
Start: 2013-03-26 — End: 2013-04-06
  Administered 2013-03-26: 283 mg via RECTAL
  Filled 2013-03-26: qty 1

## 2013-03-26 MED ORDER — LEVOTHYROXINE SODIUM 125 MCG PO TABS
125.0000 ug | ORAL_TABLET | Freq: Every day | ORAL | Status: DC
  Administered 2013-03-26: 125 ug via ORAL
  Filled 2013-03-26 (×2): qty 1

## 2013-03-26 MED ORDER — WHITE PETROLATUM GEL
Status: AC
  Administered 2013-03-26: 0.2
  Filled 2013-03-26: qty 5

## 2013-03-26 MED ORDER — MAGNESIUM SULFATE 40 MG/ML IJ SOLN
2.0000 g | Freq: Once | INTRAMUSCULAR | Status: AC
  Administered 2013-03-26: 2 g via INTRAVENOUS
  Filled 2013-03-26: qty 50

## 2013-03-26 MED ORDER — ACETAMINOPHEN 325 MG PO TABS
650.0000 mg | ORAL_TABLET | Freq: Four times a day (QID) | ORAL | Status: DC | PRN
  Administered 2013-03-26 – 2013-03-27 (×5): 650 mg via ORAL
  Filled 2013-03-26 (×6): qty 2

## 2013-03-26 MED ORDER — METOPROLOL TARTRATE 1 MG/ML IV SOLN
5.0000 mg | Freq: Once | INTRAVENOUS | Status: AC
  Administered 2013-03-26: 5 mg via INTRAVENOUS
  Filled 2013-03-26: qty 5

## 2013-03-26 MED ORDER — HEPARIN SODIUM (PORCINE) 5000 UNIT/ML IJ SOLN
5000.0000 [IU] | Freq: Three times a day (TID) | INTRAMUSCULAR | Status: DC
  Administered 2013-03-26 – 2013-04-06 (×34): 5000 [IU] via SUBCUTANEOUS
  Filled 2013-03-26 (×37): qty 1

## 2013-03-26 MED ORDER — ALBUTEROL SULFATE HFA 108 (90 BASE) MCG/ACT IN AERS: 4.0000 | INHALATION_SPRAY | RESPIRATORY_TRACT | Status: DC | PRN

## 2013-03-26 MED ORDER — MAGNESIUM SULFATE 50 % IJ SOLN
2.0000 g | Freq: Once | INTRAVENOUS | Status: DC
  Filled 2013-03-26: qty 4

## 2013-03-26 MED ORDER — VANCOMYCIN HCL IN DEXTROSE 750-5 MG/150ML-% IV SOLN
750.0000 mg | INTRAVENOUS | Status: DC
  Administered 2013-03-26 – 2013-03-28 (×3): 750 mg via INTRAVENOUS
  Filled 2013-03-26 (×4): qty 150

## 2013-03-26 MED ORDER — NOREPINEPHRINE BITARTRATE 1 MG/ML IJ SOLN
5.0000 ug/min | INTRAVENOUS | Status: DC
  Administered 2013-03-26: 5 ug/min via INTRAVENOUS
  Filled 2013-03-26: qty 4

## 2013-03-26 MED ORDER — DEXTROSE 5 % IV SOLN
1.0000 g | INTRAVENOUS | Status: DC
  Administered 2013-03-26 – 2013-04-03 (×8): 1 g via INTRAVENOUS
  Filled 2013-03-26 (×11): qty 1

## 2013-03-26 MED ORDER — OXYCODONE HCL 5 MG PO TABS
10.0000 mg | ORAL_TABLET | Freq: Once | ORAL | Status: AC
  Administered 2013-03-26: 10 mg via ORAL
  Filled 2013-03-26: qty 2

## 2013-03-26 MED ORDER — PANTOPRAZOLE SODIUM 40 MG IV SOLR
40.0000 mg | Freq: Every day | INTRAVENOUS | Status: DC
  Administered 2013-03-26: 40 mg via INTRAVENOUS
  Filled 2013-03-26 (×3): qty 40

## 2013-03-26 MED ORDER — DEXTROSE 5 % IV SOLN
2.0000 g | Freq: Once | INTRAVENOUS | Status: AC
  Administered 2013-03-26: 2 g via INTRAVENOUS
  Filled 2013-03-26: qty 2

## 2013-03-26 MED ORDER — SODIUM CHLORIDE 0.9 % IV SOLN
250.0000 mL | INTRAVENOUS | Status: DC | PRN
  Administered 2013-03-26: 250 mL via INTRAVENOUS

## 2013-03-26 MED ORDER — ASPIRIN 81 MG PO CHEW
81.0000 mg | CHEWABLE_TABLET | Freq: Every day | ORAL | Status: DC
  Administered 2013-03-26 – 2013-04-06 (×10): 81 mg via ORAL
  Filled 2013-03-26 (×11): qty 1

## 2013-03-26 MED ORDER — ADENOSINE 6 MG/2ML IV SOLN: 6.0000 mg | Freq: Once | INTRAVENOUS | Status: AC

## 2013-03-26 NOTE — Procedures (Signed)
Central Venous Catheter Insertion Procedure Note JOCEE KISSICK [id number] 08-Jul-1925  Procedure: Insertion of Central Venous Catheter Indications: Assessment of intravascular volume, Drug and/or fluid administration and Frequent blood sampling  Procedure Details Consent: Risks of procedure as well as the alternatives and risks of each were explained to the (patient/caregiver).  Consent for procedure obtained. Time Out: Verified patient identification, verified procedure, site/side was marked, verified correct patient position, special equipment/implants available, medications/allergies/relevent history reviewed, required imaging and test results available.  Performed  Maximum sterile technique was used including antiseptics, cap, gloves, gown, hand hygiene, mask and sheet. Skin prep: Chlorhexidine; local anesthetic administered A antimicrobial bonded/coated triple lumen catheter was placed in the right internal jugular vein using the Seldinger technique. Initial attempt was made to access the left IJ vein but we were unable to tread the guidewire.  Evaluation Blood flow good Complications: No apparent complications Patient did tolerate procedure well. Chest X-ray ordered to verify placement.  CXR: normal.  Overton Mam, M.D. Pulmonary and Critical Care Medicine Call E-link with questions [phone] 03/26/2013, 3:41 AM

## 2013-03-26 NOTE — Progress Notes (Signed)
eLink Physician-Brief Progress Note Patient Name: Chelsea Lynch DOB: [birth year] MRN: [id number]  Date of Service  03/26/2013   HPI/Events of Note   constipated  eICU Interventions  Senna docusate   Intervention Category Intermediate Interventions: Other:  [name] 03/26/2013, 5:56 PM

## 2013-03-26 NOTE — Progress Notes (Signed)
1610: Patient seen on monitor SVT 160's sustained. Patient resting in bed with eyes closed.  Elink MD made aware and able to see patient through in room camera. Patient SBP in 70's, patient instructed to bear down, and cough, no improvement seen on monitor. Orders received to push adenosine 6mg  IV.  Pads placed on patient. Dr. Belinda Block on camera instructed to proceed to pushing adenosine with 2 RNs at bedside. Patient asystolic for brief period, then heart rate begins to slowly increase to 80-90 NSR.  Patient tolerated well. Will continue to closely monitor.

## 2013-03-26 NOTE — Progress Notes (Signed)
  Echocardiogram 2D Echocardiogram has been performed.  Chelsea Lynch FRANCES 03/26/2013, 12:29 PM

## 2013-03-26 NOTE — Progress Notes (Signed)
CRITICAL VALUE ALERT  Critical value received:  Troponin 0.67  Date of notification:  03/26/13  Time of notification:  0556  Critical value read back:yes  Nurse who received alert:  C. Madilyn Fireman, RN  MD notified (1st page):  Sandi Carne  Time of first page:  0609  MD notified (2nd page):  Time of second page:  Responding MD:  Sandi Carne  Time MD responded:  [phone]

## 2013-03-26 NOTE — H&P (Signed)
PULMONARY  / CRITICAL CARE MEDICINE  Name: GWENDA HEINER MRN: [id number] DOB: 1925-08-29    ADMISSION DATE:  03/25/2013  PRIMARY SERVICE: PCCM  CHIEF COMPLAINT:  Altered mental status  BRIEF PATIENT DESCRIPTION:  79 years old female presents with AMS, urinary symptoms and hypotension. SVT  LINES / TUBES: - Right IJ CVC 4/20>>> - Peripheral IV's  CULTURES: - Blood 03/26/13 - Urine 03/26/13 - Sputum 03/26/13  ANTIBIOTICS: - Cefepime 03/26/13 -vanc 4/20>>>  SUBJECTIVE:  SVt  Re occurence  VITAL SIGNS: Temp:  [99.1 F (37.3 C)-101.1 F (38.4 C)] 99.8 F (37.7 C) (04/20 0800) Pulse Rate:  [76-186] 88 (04/20 0800) Resp:  [14-27] 17 (04/20 0800) BP: (52-137)/(38-88) 95/51 mmHg (04/20 0800) SpO2:  [83 %-100 %] 100 % (04/20 0800) FiO2 (%):  [30 %-35 %] 35 % (04/20 0650) Weight:  [64 kg (141 lb 1.5 oz)] 64 kg (141 lb 1.5 oz) (04/20 0300) CVP:  [10 mmHg-16 mmHg] 11 mmHg VENTILATOR SETTINGS: Vent Mode:  [-]  FiO2 (%):  [30 %-35 %] 35 % INTAKE / OUTPUT: Intake/Output     04/19 0701 - 04/20 0700 04/20 0701 - 04/21 0700   P.O.  200   I.V. (mL/kg) 40 (0.6) 30 (0.5)   Total Intake(mL/kg) 40 (0.6) 230 (3.6)   Urine (mL/kg/hr) 415 50 (0.3)   Total Output 415 50   Net -375 +180          PHYSICAL EXAMINATION: General: Pleasant female patient in mild respiratory distress but improved since am ENT: Oropharynx clear. Dry mucous membranes Heart: Normal S1, S2. No murmurs, rubs, or gallops appreciated. No bruits, equal pulses. Lungs: reduced Abdomen: Abdomen soft, non-tender and not distended, normoactive bowel sounds. No hepatosplenomegaly or masses. Musculoskeletal: No clubbing or synovitis. Skin: No rashes or lesions Neuro: No focal neurologic deficits. Awake, alert, oriented x 2  LABS:  Recent Labs Lab 03/25/13 2034 03/25/13 2035 03/25/13 2055 03/26/13 0240 03/26/13 0430  HGB 11.9*  --  12.6 9.7* 10.6*  WBC 10.9*  --   --  18.6* 16.3*  PLT 151  --   --  151 164  NA  132*  --  136  --  136  K 3.5  --  3.5  --  4.3  CL 98  --  103  --  105  CO2 23  --   --   --  22  GLUCOSE 101*  --  103*  --  136*  BUN 30*  --  30*  --  29*  CREATININE 1.81*  --  1.80* 1.73* 1.70*  CALCIUM 8.7  --   --   --  7.9*  MG  --   --   --   --  1.5  PHOS  --   --   --   --  3.7  AST 16  --   --   --   --   ALT 10  --   --   --   --   ALKPHOS 87  --   --   --   --   BILITOT 0.7  --   --   --   --   PROT 6.6  --   --   --   --   ALBUMIN 3.1*  --   --   --   --   APTT  --   --   --  34  --   INR  --   --   --  1.21  --   LATICACIDVEN  --  2.0  --   --   --   TROPONINI  --   --   --   --  0.67*  PROCALCITON  --   --   --  15.16  --   PROBNP  --  7189.0*  --   --   --   PHART  --   --   --   --  7.285*  PCO2ART  --   --   --   --  40.7  PO2ART  --   --   --   --  73.4*    Recent Labs Lab 03/26/13 0315  GLUCAP 129*    CXR: ATx mild, line wnl  ASSESSMENT / PLAN:  PULMONARY A: 1) Acute hypoxemia 2) LLL infiltrate, atelectasis vs pneumonic infiltrate P:   - Will culture sputum - Continue supplemental oxygen via NRB - Patient and husband ok with intubation if needed. - see ID -abg follow up in am and now pcxr follow up  CARDIOVASCULAR A:  1) Severe sepsis, normal lactic acid 2) Likely source is UTI but cannot rule out pneumonia.  3) Elevated troponin, likely demand ischemia, BUT see St depression with HR - will need cards eval after sepsis treated 4 SVt (levo contribution/ sepsis) P:  - Sepsis protocol started - if prerssors needed again, use neo -ensure cortisol, tsh (SVT) -replace mag in setting svt -cards eval after sepsis treated, will need risk stratification -bnp nonspecific in sepsis Dc synthroid, albuteral  RENAL A:   1) Acute renal failure, likely pre renal P:   - cvp 13, to kvo Chem in am   GASTROINTESTINAL A:   1) GERD P:   - IV protonix -maintain npo, see resp, await abg lft in am   HEMATOLOGIC A:   1) Mild anemia P:  -  No indication for transfusion - DVT prophylaxis with SQ heparin - SCD's  INFECTIOUS A:   1) Severe sepsis 2) UTI 3) Questionable LLL infiltrates (not impressed) P:   - Cefepime - Will follow cultures -add vanc  ENDOCRINE A:   1) Hypothyroidism P:   - Continue Synthroid, may need to hold as we wait for tsh with svt  NEUROLOGIC A:   1) Altered mental status, improved P:   - Continue monitoring - The patient is full code  I have personally obtained a history, examined the patient, evaluated laboratory and imaging results, formulated the assessment and plan and placed orders. CRITICAL CARE: The patient is critically ill with multiple organ systems failure and requires high complexity decision making for assessment and support, frequent evaluation and titration of therapies, application of advanced monitoring technologies and extensive interpretation of multiple databases. Critical Care Time devoted to patient care services described in this note is 30 minutes.   Mcarthur Rossetti. Tyson Alias, MD, FACP Pgr: [phone] [place] [facility]

## 2013-03-26 NOTE — ED Notes (Signed)
Resp therapy notified for ABG

## 2013-03-26 NOTE — Progress Notes (Addendum)
ANTIBIOTIC CONSULT NOTE - INITIAL  Pharmacy Consult for Cefepime Indication: R/o UTI, severe sepsis  Allergies  Allergen Reactions  . Clarithromycin Other (See Comments)    Unknown   . Codeine Other (See Comments)    Unknown   . Penicillins Other (See Comments)    Unknown   . Tequin (Gatifloxacin) Other (See Comments)    Unknown     Patient Measurements: Height: 5\' 4"  (162.6 cm) Weight: 141 lb 1.5 oz (64 kg) IBW/kg (Calculated) : 54.7  Vital Signs: Temp: 99.7 F (37.6 C) (04/20 0400) Temp src: Oral (04/20 0425) BP: 128/88 mmHg (04/20 0400) Pulse Rate: 107 (04/20 0400) Intake/Output from previous day: 04/19 0701 - 04/20 0700 In: -  Out: 60 [Urine:60] Intake/Output from this shift: Total I/O In: -  Out: 60 [Urine:60]  Labs:  Recent Labs  03/25/13 2034 03/25/13 2055 03/26/13 0240  WBC 10.9*  --  18.6*  HGB 11.9* 12.6 9.7*  PLT 151  --  151  CREATININE 1.81* 1.80*  --    Estimated Creatinine Clearance: 19.4 ml/min (by C-G formula based on Cr of 1.8). No results found for this basename: VANCOTROUGH, VANCOPEAK, VANCORANDOM, GENTTROUGH, GENTPEAK, GENTRANDOM, TOBRATROUGH, TOBRAPEAK, TOBRARND, AMIKACINPEAK, AMIKACINTROU, AMIKACIN,  in the last 72 hours   Microbiology: No results found for this or any previous visit (from the past 720 hour(s)).  Medical History: Past Medical History  Diagnosis Date  . Chronic back pain   . Hypothyroid   . GERD (gastroesophageal reflux disease)   . Depression   . Neuropathy   . Constipation, chronic   . Insomnia   . Anxiety   . Chronic pain     Medications:  Scheduled:  . [COMPLETED] adenosine (ADENOCARD) IV  12 mg Intravenous Once  . [COMPLETED] adenosine (ADENOCARD) IV  6 mg Intravenous Once  . [COMPLETED] adenosine      . aspirin  81 mg Oral Daily  . [COMPLETED] ceFEPime (MAXIPIME) IV  2 g Intravenous Once  . [COMPLETED] cefTRIAXone (ROCEPHIN)  IV  1 g Intravenous Once  . heparin  5,000 Units Subcutaneous Q8H   . levothyroxine  125 mcg Oral QAC breakfast  . [COMPLETED] metoprolol  5 mg Intravenous Once  . pantoprazole (PROTONIX) IV  40 mg Intravenous QHS  . [DISCONTINUED] aspirin  81 mg Oral Daily   Assessment: 79 yo female presented with altered mental status. Pharmacy to manage cefepime for severe sepsis and r/o UTI.  Plan:  1. Cefepime 1gm IV Q24H.   Emeline Gins 03/26/2013,4:29 AM  Admit Complaint: 79 y.o.  female  admitted 03/25/2013 with AMS.  Pharmacy consulted to dose cefepime and vancomycin.  PMH: hypothyroid, chronic pain, GERD,  Neuropathy, chronic constipation, anxiety,   Overnight Events: 03/26/2013 Episode of SVT overnight  Assessment: Anticoagulation: VTE Prophylaxis: SQ heparin, 10.6/30.6 Platelets 164 no bleeding noted  Infectious Disease:  PNA v UTI, emipiric WBC 16.3,  Antibiotics: 4/20 Cefepime, Vanc Cultures: sputum, urine & blood pending  Cardiovascular: SVT, severe sepsis: ASA, BP 95/51, HR 80s,  Current Weight: 141 lb 1.5 oz (64 kg)  Endocrinology: hypothyroid, holding synthroid dt SVT  GI / Nutrition: GERD: IV PTX  Nephrology/Urology/Electrolytes/Optho:  ARF, CrCl 20.5 0.6 mL/kg/hr  PTA Medication Issues: Home Meds Not Ordered: linaclotide 145 hs, mirabegron 50 daiy, metop XL 37.5 daily, docusate PO, biotin daily, furo 20 daily, gabapentin 400 tid, oxycontin 10 bid, venlafaxine 75,   Best Practices: VTE Prophylaxis:    Goal:  Renal adjustment of medications, and Vancomycin trough  15-20 mcg/ml  Plan: 1. Vancomycin 750 mg IV q24h 2. Continue cefepime 3. Follow up SCr, UOP, cultures, clinical course and adjust as clinically indicated.  Thank you for allowing pharmacy to be a part of this patients care team.  Lovenia Kim Pharm.D., BCPS Clinical Pharmacist 03/26/2013 10:17 AM Pager: [phone] Phone: [phone]

## 2013-03-26 NOTE — Progress Notes (Signed)
eLink Physician-Brief Progress Note Patient Name: Chelsea Lynch DOB: 16-Nov-1925 MRN: [id number]  Date of Service  03/26/2013   HPI/Events of Note   SVT with rate of 160  eICU Interventions  Gave 6mg  adenosine with conversion to sinus rhythm   Intervention Category Major Interventions: Arrhythmia - evaluation and management  GIDDINGS, OLIVIA K. 03/26/2013, 6:59 AM

## 2013-03-26 NOTE — H&P (Signed)
PULMONARY  / CRITICAL CARE MEDICINE  Name: Chelsea Lynch MRN: [id number] DOB: 04/10/1925    ADMISSION DATE:  03/25/2013  PRIMARY SERVICE: PCCM  CHIEF COMPLAINT:  Altered mental status  BRIEF PATIENT DESCRIPTION:  79 years old female presents with AMS, urinary symptoms and hypotension.  LINES / TUBES: - Right IJ CVC - Peripheral IV's  CULTURES: - Blood 03/26/13 - Urine 03/26/13 - Sputum 03/26/13  ANTIBIOTICS: - Cefepime 03/26/13  HISTORY OF PRESENT ILLNESS:   79 years old female with PMH relevant for hypothyroidism, chronic pain, GERD. Presents with one day history of confusion, fever, chills, mild suprapupic pain and burning on urination. At admission to the ED found tachycardic on SVT that converted to sinus rhythm with adenosine. The patient was found to be hypotensive and this persisted despite 4 L of IVF. Critical care was called to admit. At the time of my examination in the ED, the patient is awake, alert, oriented x 3. She is hypotensive with MAP's in the 50's, her tachycardia has resolved. She is in mild respiratory distress. Saturating 96% on 100% NRB. Urine analysis compatible with UTI. Denies CP, SOB, sputum production.  PAST MEDICAL HISTORY :  Past Medical History  Diagnosis Date  . Chronic back pain   . Hypothyroid   . GERD (gastroesophageal reflux disease)   . Depression   . Neuropathy   . Constipation, chronic   . Insomnia   . Anxiety   . Chronic pain    History reviewed. No pertinent past surgical history. Prior to Admission medications   Medication Sig Start Date End Date Taking? Authorizing Provider  aspirin 81 MG tablet Take 81 mg by mouth daily.   Yes Historical Provider, MD  Biotin 5000 MCG CAPS Take 5,000 mcg by mouth daily.   Yes Historical Provider, MD  docusate sodium (COLACE) 100 MG capsule Take 100 mg by mouth daily with supper.   Yes Historical Provider, MD  fish oil-omega-3 fatty acids 1000 MG capsule Take 2 g by mouth daily.   Yes Historical  Provider, MD  furosemide (LASIX) 20 MG tablet Take 20 mg by mouth daily.   Yes Historical Provider, MD  gabapentin (NEURONTIN) 400 MG capsule Take 400 mg by mouth 3 (three) times daily.   Yes Historical Provider, MD  levothyroxine (SYNTHROID, LEVOTHROID) 125 MCG tablet Take 125 mcg by mouth daily before breakfast.   Yes Historical Provider, MD  Linaclotide (LINZESS) 145 MCG CAPS Take 145 mcg by mouth every evening.   Yes Historical Provider, MD  metoprolol succinate (TOPROL-XL) 25 MG 24 hr tablet Take 37.5 mg by mouth daily.   Yes Historical Provider, MD  mirabegron ER (MYRBETRIQ) 50 MG TB24 Take 50 mg by mouth daily.   Yes Historical Provider, MD  omeprazole (PRILOSEC) 20 MG capsule Take 20 mg by mouth daily before breakfast.   Yes Historical Provider, MD  oxyCODONE (OXYCONTIN) 10 MG 12 hr tablet Take 10 mg by mouth every 12 (twelve) hours.   Yes Historical Provider, MD  oxyCODONE-acetaminophen (PERCOCET/ROXICET) 5-325 MG per tablet Take 1 tablet by mouth every 8 (eight) hours as needed for pain.   Yes Historical Provider, MD  potassium chloride (K-DUR,KLOR-CON) 10 MEQ tablet Take 10 mEq by mouth every other day.   Yes Historical Provider, MD  traZODone (DESYREL) 50 MG tablet Take 100 mg by mouth at bedtime.   Yes Historical Provider, MD  trimethoprim (TRIMPEX) 100 MG tablet Take 100 mg by mouth at bedtime. For bladder   Yes Historical  Provider, MD  venlafaxine XR (EFFEXOR-XR) 75 MG 24 hr capsule Take 75 mg by mouth 2 (two) times daily.   Yes Historical Provider, MD  Vitamin D, Ergocalciferol, (DRISDOL) 50000 UNITS CAPS Take 50,000 Units by mouth every 7 (seven) days.   Yes Historical Provider, MD  zaleplon (SONATA) 10 MG capsule Take 10 mg by mouth at bedtime.    Historical Provider, MD   Allergies  Allergen Reactions  . Clarithromycin Other (See Comments)    Unknown   . Codeine Other (See Comments)    Unknown   . Penicillins Other (See Comments)    Unknown   . Tequin (Gatifloxacin)  Other (See Comments)    Unknown     FAMILY HISTORY:  No family history on file. SOCIAL HISTORY:  reports that she has never smoked. She has never used smokeless tobacco. She reports that she does not drink alcohol or use illicit drugs.  REVIEW OF SYSTEMS:  All systems reviewed and found negative except for what I mentioned in the HPI.   SUBJECTIVE:   VITAL SIGNS: Temp:  [99.1 F (37.3 C)-99.3 F (37.4 C)] 99.1 F (37.3 C) (04/20 0246) Pulse Rate:  [76-186] 89 (04/20 0246) Resp:  [14-27] 22 (04/20 0246) BP: (52-137)/(38-75) 137/73 mmHg (04/20 0246) SpO2:  [83 %-100 %] 94 % (04/20 0246) FiO2 (%):  [30 %] 30 % (04/19 2328)   VENTILATOR SETTINGS: Vent Mode:  [-]  FiO2 (%):  [30 %] 30 % INTAKE / OUTPUT: Intake/Output     04/19 0701 - 04/20 0700   Urine 60   Total Output 60   Net -60         PHYSICAL EXAMINATION: General: Pleasant female patient in mild respiratory distress. Eyes: Anicteric sclerae. ENT: Oropharynx clear. Dry mucous membranes. No thrush Lymph: No cervical, supraclavicular, or axillary lymphadenopathy. Heart: Normal S1, S2. No murmurs, rubs, or gallops appreciated. No bruits, equal pulses. Lungs: Normal excursion, no dullness to percussion. Good air movement bilaterally, without wheezes or crackles. Normal upper airway sounds without evidence of stridor. Abdomen: Abdomen soft, non-tender and not distended, normoactive bowel sounds. No hepatosplenomegaly or masses. Musculoskeletal: No clubbing or synovitis. Skin: No rashes or lesions Neuro: No focal neurologic deficits. Awake, alert, oriented x 3  LABS:  Recent Labs Lab 03/25/13 2034 03/25/13 2035 03/25/13 2055  HGB 11.9*  --  12.6  WBC 10.9*  --   --   PLT 151  --   --   NA 132*  --  136  K 3.5  --  3.5  CL 98  --  103  CO2 23  --   --   GLUCOSE 101*  --  103*  BUN 30*  --  30*  CREATININE 1.81*  --  1.80*  CALCIUM 8.7  --   --   AST 16  --   --   ALT 10  --   --   ALKPHOS 87  --   --    BILITOT 0.7  --   --   PROT 6.6  --   --   ALBUMIN 3.1*  --   --   LATICACIDVEN  --  2.0  --   PROBNP  --  7189.0*  --    No results found for this basename: GLUCAP,  in the last 168 hours  CXR:  Blurring of the left hemidiaphragm, this could be consistent with atelectasis vs pneumonia.  ASSESSMENT / PLAN:  PULMONARY A: 1) Acute hypoxemia 2) LLL infiltrate, atelectasis  vs pneumonic infiltrate P:   - Will culture sputum - Continue supplemental oxygen via NRB - Patient and husband ok with intubation if needed. - On cefepime for UTI - Albuterol PRN  CARDIOVASCULAR A:  1) Severe sepsis, normal lactic acid 2) Likely source is UTI but cannot rule out pneumonia.  3) Elevated troponin, likely demand ischemia.  P:  - Sepsis protocol started - Norepinephrine ordered - Will follow blood cultures - We will trend troponin - Cefepime  RENAL A:   1) Acute renal failure, likely pre renal P:   - Will continue IVF resuscitation per protocol - Monitor BMP in am  GASTROINTESTINAL A:   1) GERD P:   - IV protonix  HEMATOLOGIC A:   1) Mild anemia P:  - No indication for transfusion - Will monitor CBC - DVT prophylaxis with SQ heparin - SCD's  INFECTIOUS A:   1) Severe sepsis 2) UTI 3) Questionable LLL infiltrates P:   - Cefepime - Will follow cultures  ENDOCRINE A:   1) Hypothyroidism P:   - Continue Synthroid  NEUROLOGIC A:   1) Altered mental status, improved P:   - Continue monitoring - The patient is full code - Family informed at bedside.  I have personally obtained a history, examined the patient, evaluated laboratory and imaging results, formulated the assessment and plan and placed orders. CRITICAL CARE: The patient is critically ill with multiple organ systems failure and requires high complexity decision making for assessment and support, frequent evaluation and titration of therapies, application of advanced monitoring technologies and  extensive interpretation of multiple databases. Critical Care Time devoted to patient care services described in this note is 60 minutes.   Overton Mam, MD Pulmonary and Critical Care Medicine [facility] Pager: [phone]  03/26/2013, 3:18 AM

## 2013-03-26 NOTE — Progress Notes (Signed)
eLink Physician-Brief Progress Note Patient Name: HALEEMAH BUCKALEW DOB: 1925/05/06 MRN: [id number]  Date of Service  03/26/2013   HPI/Events of Note    new blood culture results showing gram-negative rods and anaerobic bottle  Patient already on cefepime and vancomycin   Anti-infectives   Start     Dose/Rate Route Frequency Ordered Stop   03/26/13 2200  ceFEPIme (MAXIPIME) 1 g in dextrose 5 % 50 mL IVPB     1 g 100 mL/hr over 30 Minutes Intravenous Every 24 hours 03/26/13 0432     03/26/13 1315  vancomycin (VANCOCIN) IVPB 750 mg/150 ml premix     750 mg 150 mL/hr over 60 Minutes Intravenous Every 24 hours 03/26/13 1305     03/26/13 0230  ceFEPIme (MAXIPIME) 2 g in dextrose 5 % 50 mL IVPB     2 g 100 mL/hr over 30 Minutes Intravenous  Once 03/26/13 0219 03/26/13 0256   03/26/13 0000  cefTRIAXone (ROCEPHIN) 1 g in dextrose 5 % 50 mL IVPB     1 g 100 mL/hr over 30 Minutes Intravenous  Once 03/25/13 2355 03/26/13 0035       eICU Interventions   clinically she is doing better. therefore no change in management    Intervention Category Intermediate Interventions: Diagnostic test evaluation  Kainan Patty 03/26/2013, 6:11 PM

## 2013-03-26 NOTE — ED Notes (Signed)
Critical care MD at bedside 

## 2013-03-26 NOTE — Consult Note (Signed)
Reason for Consult:SVT Referring Physician: Dr. Delrae Lynch is an 79 y.o. female.  HPI:   The The patient is an 79 yo female with a history of hypothyroidism, PAF/atrial tachycardia(beta blocker increased last June), chronic back pain, GERD, mild CHF for which she takes 20mg  of lasix daily.  She had a Lexiscan myoview in may 2013 which revealed a small fixed septal artifact and no ischemia or infarct.  She presented with sepsis, UTI, SVT.  She was converted with adenosine.  She reported some CP which she described as "Indigestion" as well as SOB, abd pain and palpitations.   Past Medical History  Diagnosis Date  . Hypothyroid   . GERD (gastroesophageal reflux disease)   . Depression   . Neuropathy   . Constipation, chronic   . Insomnia   . Anxiety   . Chronic pain     History reviewed. No pertinent past surgical history.  No family history on file.  Social History:  reports that she has never smoked. She has never used smokeless tobacco. She reports that she does not drink alcohol or use illicit drugs.  Allergies:  Allergies  Allergen Reactions  . Clarithromycin Other (See Comments)    Unknown   . Codeine Other (See Comments)    Unknown   . Penicillins Other (See Comments)    Unknown   . Tequin (Gatifloxacin) Other (See Comments)    Unknown     Medications:  Scheduled Meds: . aspirin  81 mg Oral Daily  . ceFEPime (MAXIPIME) IV  1 g Intravenous Q24H  . heparin  5,000 Units Subcutaneous Q8H  . magnesium sulfate 1 - 4 g bolus IVPB  2 g Intravenous Once  . pantoprazole (PROTONIX) IV  40 mg Intravenous QHS   Continuous Infusions:  PRN Meds:.sodium chloride, acetaminophen, docusate sodium   Results for orders placed during the hospital encounter of 03/25/13 (from the past 48 hour(s))  CBC WITH DIFFERENTIAL     Status: Abnormal   Collection Time    03/25/13  8:34 PM      Result Value Range   WBC 10.9 (*) 4.0 - 10.5 K/uL   RBC 3.88  3.87 - 5.11 MIL/uL    Hemoglobin 11.9 (*) 12.0 - 15.0 g/dL   HCT 01.0 (*) 27.2 - 53.6 %   MCV 87.1  78.0 - 100.0 fL   MCH 30.7  26.0 - 34.0 pg   MCHC 35.2  30.0 - 36.0 g/dL   RDW 64.4  03.4 - 74.2 %   Platelets 151  150 - 400 K/uL   Neutrophils Relative 88 (*) 43 - 77 %   Neutro Abs 9.6 (*) 1.7 - 7.7 K/uL   Lymphocytes Relative 5 (*) 12 - 46 %   Lymphs Abs 0.5 (*) 0.7 - 4.0 K/uL   Monocytes Relative 7  3 - 12 %   Monocytes Absolute 0.7  0.1 - 1.0 K/uL   Eosinophils Relative 0  0 - 5 %   Eosinophils Absolute 0.0  0.0 - 0.7 K/uL   Basophils Relative 0  0 - 1 %   Basophils Absolute 0.0  0.0 - 0.1 K/uL  COMPREHENSIVE METABOLIC PANEL     Status: Abnormal   Collection Time    03/25/13  8:34 PM      Result Value Range   Sodium 132 (*) 135 - 145 mEq/L   Potassium 3.5  3.5 - 5.1 mEq/L   Chloride 98  96 - 112 mEq/L  CO2 23  19 - 32 mEq/L   Glucose, Bld 101 (*) 70 - 99 mg/dL   BUN 30 (*) 6 - 23 mg/dL   Creatinine, Ser 1.61 (*) 0.50 - 1.10 mg/dL   Calcium 8.7  8.4 - 09.6 mg/dL   Total Protein 6.6  6.0 - 8.3 g/dL   Albumin 3.1 (*) 3.5 - 5.2 g/dL   AST 16  0 - 37 U/L   ALT 10  0 - 35 U/L   Alkaline Phosphatase 87  39 - 117 U/L   Total Bilirubin 0.7  0.3 - 1.2 mg/dL   GFR calc non Af Amer 24 (*) >90 mL/min   GFR calc Af Amer 28 (*) >90 mL/min   Comment:            The eGFR has been calculated     using the CKD EPI equation.     This calculation has not been     validated in all clinical     situations.     eGFR's persistently     <90 mL/min signify     possible Chronic Kidney Disease.  D-DIMER, QUANTITATIVE     Status: Abnormal   Collection Time    03/25/13  8:34 PM      Result Value Range   D-Dimer, Quant 3.80 (*) 0.00 - 0.48 ug/mL-FEU   Comment:            AT THE INHOUSE ESTABLISHED CUTOFF     VALUE OF 0.48 ug/mL FEU,     THIS ASSAY HAS BEEN DOCUMENTED     IN THE LITERATURE TO HAVE     A SENSITIVITY AND NEGATIVE     PREDICTIVE VALUE OF AT LEAST     98 TO 99%.  THE TEST RESULT     SHOULD  BE CORRELATED WITH     AN ASSESSMENT OF THE CLINICAL     PROBABILITY OF DVT / VTE.  ETHANOL     Status: None   Collection Time    03/25/13  8:34 PM      Result Value Range   Alcohol, Ethyl (B) <11  0 - 11 mg/dL   Comment:            LOWEST DETECTABLE LIMIT FOR     SERUM ALCOHOL IS 11 mg/dL     FOR MEDICAL PURPOSES ONLY  ACETAMINOPHEN LEVEL     Status: None   Collection Time    03/25/13  8:34 PM      Result Value Range   Acetaminophen (Tylenol), Serum <15.0  10 - 30 ug/mL   Comment:            THERAPEUTIC CONCENTRATIONS VARY     SIGNIFICANTLY. A RANGE OF 10-30     ug/mL MAY BE AN EFFECTIVE     CONCENTRATION FOR MANY PATIENTS.     HOWEVER, SOME ARE BEST TREATED     AT CONCENTRATIONS OUTSIDE THIS     RANGE.     ACETAMINOPHEN CONCENTRATIONS     >150 ug/mL AT 4 HOURS AFTER     INGESTION AND >50 ug/mL AT 12     HOURS AFTER INGESTION ARE     OFTEN ASSOCIATED WITH TOXIC     REACTIONS.  SALICYLATE LEVEL     Status: Abnormal   Collection Time    03/25/13  8:34 PM      Result Value Range   Salicylate Lvl <2.0 (*) 2.8 - 20.0 mg/dL  PRO B NATRIURETIC PEPTIDE  Status: Abnormal   Collection Time    03/25/13  8:35 PM      Result Value Range   Pro B Natriuretic peptide (BNP) 7189.0 (*) 0 - 450 pg/mL  LACTIC ACID, PLASMA     Status: None   Collection Time    03/25/13  8:35 PM      Result Value Range   Lactic Acid, Venous 2.0  0.5 - 2.2 mmol/L  POCT I-STAT, CHEM 8     Status: Abnormal   Collection Time    03/25/13  8:55 PM      Result Value Range   Sodium 136  135 - 145 mEq/L   Potassium 3.5  3.5 - 5.1 mEq/L   Chloride 103  96 - 112 mEq/L   BUN 30 (*) 6 - 23 mg/dL   Creatinine, Ser 6.21 (*) 0.50 - 1.10 mg/dL   Glucose, Bld 308 (*) 70 - 99 mg/dL   Calcium, Ion 6.57 (*) 1.13 - 1.30 mmol/L   TCO2 23  0 - 100 mmol/L   Hemoglobin 12.6  12.0 - 15.0 g/dL   HCT 84.6  96.2 - 95.2 %  POCT I-STAT TROPONIN I     Status: Abnormal   Collection Time    03/25/13  8:55 PM      Result  Value Range   Troponin i, poc 0.50 (*) 0.00 - 0.08 ng/mL   Comment NOTIFIED PHYSICIAN     Comment 3            Comment: Due to the release kinetics of cTnI,     a negative result within the first hours     of the onset of symptoms does not rule out     myocardial infarction with certainty.     If myocardial infarction is still suspected,     repeat the test at appropriate intervals.  URINALYSIS, ROUTINE W REFLEX MICROSCOPIC     Status: Abnormal   Collection Time    03/25/13 10:54 PM      Result Value Range   Color, Urine YELLOW  YELLOW   APPearance TURBID (*) CLEAR   Specific Gravity, Urine 1.017  1.005 - 1.030   pH 5.5  5.0 - 8.0   Glucose, UA NEGATIVE  NEGATIVE mg/dL   Hgb urine dipstick MODERATE (*) NEGATIVE   Bilirubin Urine NEGATIVE  NEGATIVE   Ketones, ur NEGATIVE  NEGATIVE mg/dL   Protein, ur 841 (*) NEGATIVE mg/dL   Urobilinogen, UA 0.2  0.0 - 1.0 mg/dL   Nitrite NEGATIVE  NEGATIVE   Leukocytes, UA LARGE (*) NEGATIVE  URINE RAPID DRUG SCREEN (HOSP PERFORMED)     Status: None   Collection Time    03/25/13 10:54 PM      Result Value Range   Opiates NONE DETECTED  NONE DETECTED   Cocaine NONE DETECTED  NONE DETECTED   Benzodiazepines NONE DETECTED  NONE DETECTED   Amphetamines NONE DETECTED  NONE DETECTED   Tetrahydrocannabinol NONE DETECTED  NONE DETECTED   Barbiturates NONE DETECTED  NONE DETECTED   Comment:            DRUG SCREEN FOR MEDICAL PURPOSES     ONLY.  IF CONFIRMATION IS NEEDED     FOR ANY PURPOSE, NOTIFY LAB     WITHIN 5 DAYS.                LOWEST DETECTABLE LIMITS     FOR URINE DRUG SCREEN     Drug Class  Cutoff (ng/mL)     Amphetamine      1000     Barbiturate      200     Benzodiazepine   200     Tricyclics       300     Opiates          300     Cocaine          300     THC              50  URINE MICROSCOPIC-ADD ON     Status: Abnormal   Collection Time    03/25/13 10:54 PM      Result Value Range   Squamous Epithelial / LPF FEW (*)  RARE   WBC, UA 21-50  <3 WBC/hpf   RBC / HPF 3-6  <3 RBC/hpf   Bacteria, UA MANY (*) RARE  TYPE AND SCREEN     Status: None   Collection Time    03/26/13  2:30 AM      Result Value Range   ABO/RH(D) A POS     Antibody Screen NEG     Sample Expiration 03/29/2013    ABO/RH     Status: None   Collection Time    03/26/13  2:30 AM      Result Value Range   ABO/RH(D) A POS    CBC     Status: Abnormal   Collection Time    03/26/13  2:40 AM      Result Value Range   WBC 18.6 (*) 4.0 - 10.5 K/uL   RBC 3.22 (*) 3.87 - 5.11 MIL/uL   Hemoglobin 9.7 (*) 12.0 - 15.0 g/dL   Comment: DELTA CHECK NOTED     REPEATED TO VERIFY   HCT 28.4 (*) 36.0 - 46.0 %   MCV 88.2  78.0 - 100.0 fL   MCH 30.1  26.0 - 34.0 pg   MCHC 34.2  30.0 - 36.0 g/dL   RDW 16.1  09.6 - 04.5 %   Platelets 151  150 - 400 K/uL  PROCALCITONIN     Status: None   Collection Time    03/26/13  2:40 AM      Result Value Range   Procalcitonin 15.16     Comment:            Interpretation:     PCT >= 10 ng/mL:     Important systemic inflammatory response,     almost exclusively due to severe bacterial     sepsis or septic shock.     (NOTE)             ICU PCT Algorithm               Non ICU PCT Algorithm        ----------------------------     ------------------------------             PCT < 0.25 ng/mL                 PCT < 0.1 ng/mL         Stopping of antibiotics            Stopping of antibiotics           strongly encouraged.               strongly encouraged.        ----------------------------     ------------------------------  PCT level decrease by               PCT < 0.25 ng/mL           >= 80% from peak PCT           OR PCT 0.25 - 0.5 ng/mL          Stopping of antibiotics                                                 encouraged.         Stopping of antibiotics               encouraged.        ----------------------------     ------------------------------           PCT level decrease by               PCT >= 0.25 ng/mL           < 80% from peak PCT            AND PCT >= 0.5 ng/mL            Continuing antibiotics                                                  encouraged.           Continuing antibiotics                encouraged.        ----------------------------     ------------------------------         PCT level increase compared          PCT > 0.5 ng/mL             with peak PCT AND              PCT >= 0.5 ng/mL             Escalation of antibiotics                                              strongly encouraged.          Escalation of antibiotics            strongly encouraged.  PROTIME-INR     Status: None   Collection Time    03/26/13  2:40 AM      Result Value Range   Prothrombin Time 15.1  11.6 - 15.2 seconds   INR 1.21  0.00 - 1.49  APTT     Status: None   Collection Time    03/26/13  2:40 AM      Result Value Range   aPTT 34  24 - 37 seconds  FIBRINOGEN     Status: Abnormal   Collection Time    03/26/13  2:40 AM      Result Value Range   Fibrinogen 485 (*) 204 - 475 mg/dL  CREATININE, SERUM     Status: Abnormal   Collection Time    03/26/13  2:40 AM      Result Value Range   Creatinine, Ser 1.73 (*) 0.50 - 1.10 mg/dL   GFR calc non Af Amer 26 (*) >90 mL/min   GFR calc Af Amer 30 (*) >90 mL/min   Comment:            The eGFR has been calculated     using the CKD EPI equation.     This calculation has not been     validated in all clinical     situations.     eGFR's persistently     <90 mL/min signify     possible Chronic Kidney Disease.  GLUCOSE, CAPILLARY     Status: Abnormal   Collection Time    03/26/13  3:15 AM      Result Value Range   Glucose-Capillary 129 (*) 70 - 99 mg/dL   Comment 1 Notify RN    MRSA PCR SCREENING     Status: None   Collection Time    03/26/13  3:17 AM      Result Value Range   MRSA by PCR NEGATIVE  NEGATIVE   Comment:            The GeneXpert MRSA Assay (FDA     approved for NASAL specimens     only), is one  component of a     comprehensive MRSA colonization     surveillance program. It is not     intended to diagnose MRSA     infection nor to guide or     monitor treatment for     MRSA infections.  BASIC METABOLIC PANEL     Status: Abnormal   Collection Time    03/26/13  4:30 AM      Result Value Range   Sodium 136  135 - 145 mEq/L   Potassium 4.3  3.5 - 5.1 mEq/L   Comment: DELTA CHECK NOTED   Chloride 105  96 - 112 mEq/L   CO2 22  19 - 32 mEq/L   Glucose, Bld 136 (*) 70 - 99 mg/dL   BUN 29 (*) 6 - 23 mg/dL   Creatinine, Ser 1.61 (*) 0.50 - 1.10 mg/dL   Calcium 7.9 (*) 8.4 - 10.5 mg/dL   GFR calc non Af Amer 26 (*) >90 mL/min   GFR calc Af Amer 30 (*) >90 mL/min   Comment:            The eGFR has been calculated     using the CKD EPI equation.     This calculation has not been     validated in all clinical     situations.     eGFR's persistently     <90 mL/min signify     possible Chronic Kidney Disease.  CBC     Status: Abnormal   Collection Time    03/26/13  4:30 AM      Result Value Range   WBC 16.3 (*) 4.0 - 10.5 K/uL   RBC 3.45 (*) 3.87 - 5.11 MIL/uL   Hemoglobin 10.6 (*) 12.0 - 15.0 g/dL   HCT 09.6 (*) 04.5 - 40.9 %   MCV 88.7  78.0 - 100.0 fL   MCH 30.7  26.0 - 34.0 pg   MCHC 34.6  30.0 - 36.0 g/dL   RDW 81.1  91.4 - 78.2 %   Platelets 164  150 - 400 K/uL  MAGNESIUM     Status: None   Collection Time  03/26/13  4:30 AM      Result Value Range   Magnesium 1.5  1.5 - 2.5 mg/dL  PHOSPHORUS     Status: None   Collection Time    03/26/13  4:30 AM      Result Value Range   Phosphorus 3.7  2.3 - 4.6 mg/dL  BLOOD GAS, ARTERIAL     Status: Abnormal   Collection Time    03/26/13  4:30 AM      Result Value Range   FIO2 0.30     Delivery systems VENTURI MASK     pH, Arterial 7.285 (*) 7.350 - 7.450   pCO2 arterial 40.7  35.0 - 45.0 mmHg   pO2, Arterial 73.4 (*) 80.0 - 100.0 mmHg   Bicarbonate 18.4 (*) 20.0 - 24.0 mEq/L   TCO2 19.6  0 - 100 mmol/L    Acid-base deficit 6.8 (*) 0.0 - 2.0 mmol/L   O2 Saturation 92.8     Patient temperature 100.4     Collection site LEFT RADIAL     Drawn by [id number]     Sample type ARTERIAL DRAW     Allens test (pass/fail) PASS  PASS  CORTISOL     Status: None   Collection Time    03/26/13  4:30 AM      Result Value Range   Cortisol, Plasma 14.3     Comment: (NOTE)     AM:  4.3 - 22.4 ug/dL     PM:  3.1 - 40.9 ug/dL  TROPONIN I     Status: Abnormal   Collection Time    03/26/13  4:30 AM      Result Value Range   Troponin I 0.67 (*) <0.30 ng/mL   Comment:            Due to the release kinetics of cTnI,     a negative result within the first hours     of the onset of symptoms does not rule out     myocardial infarction with certainty.     If myocardial infarction is still suspected,     repeat the test at appropriate intervals.     CRITICAL RESULT CALLED TO, READ BACK BY AND VERIFIED WITH:     HAYES C,RN 03/26/13 0556 WAYK  POCT I-STAT 3, BLOOD GAS (G3+)     Status: Abnormal   Collection Time    03/26/13 10:39 AM      Result Value Range   pH, Arterial 7.340 (*) 7.350 - 7.450   pCO2 arterial 34.7 (*) 35.0 - 45.0 mmHg   pO2, Arterial 105.0 (*) 80.0 - 100.0 mmHg   Bicarbonate 18.6 (*) 20.0 - 24.0 mEq/L   TCO2 20  0 - 100 mmol/L   O2 Saturation 98.0     Acid-base deficit 6.0 (*) 0.0 - 2.0 mmol/L   Patient temperature 99.6 F     Collection site RADIAL, ALLEN'S TEST ACCEPTABLE     Sample type ARTERIAL      Dg Chest Portable 1 View  03/26/2013  *RADIOLOGY REPORT*  Clinical Data: Central line placement.  PORTABLE CHEST - 1 VIEW  Comparison: Chest radiograph performed 03/25/2013, and CT myelogram performed 12/31/2010  Findings: The patient's right IJ line is seen ending about the mid to distal SVC.  Lung expansion is slightly improved.  Vascular congestion is noted. Mild bibasilar opacities likely reflect atelectasis; left basilar airspace opacity has improved.  No definite pleural effusion or  pneumothorax is seen.  The  cardiomediastinal silhouette is normal in size; calcification is noted within the aortic arch.  Given the pattern of calcification, there is suspicion for mild dilatation of the aortic arch.  No acute osseous abnormalities are seen.  The patient is status post right-sided rotator cuff repair.  Clips are noted within the right upper quadrant, reflecting prior cholecystectomy.  IMPRESSION:  1.  Right IJ line ends about the mid to distal SVC. 2.  Lung expansion has slightly improved; vascular congestion noted.  Mild bibasilar opacities likely reflect atelectasis; left basilar airspace opacity has improved. 3.  Question of mild dilatation of the aortic arch; this appears slightly more prominent than in 2012, when the aortic arch measured 4.0 cm in diameter on CT.   Original Report Authenticated By: Tonia Ghent, M.D.    Dg Chest Port 1 View  03/25/2013  *RADIOLOGY REPORT*  Clinical Data: 79 year old female altered mental status shortness of breath.  PORTABLE CHEST - 1 VIEW  Comparison: Thoracic spine CT 12/31/2010.  CT abdomen 01/17/2010.  Findings: Portable semi upright AP view at 2039 hours.  Stable to mildly lower lung volumes.  Mild left basilar opacity.  Stable cardiac size and mediastinal contours.  Chronic thoracic spinal stimulator lead appears stable.  Stable right upper quadrant surgical clips. Visualized tracheal air column is within normal limits.  Chronic deficiency of the distal right clavicle. No pneumothorax.  No pleural effusion identified.  IMPRESSION: Mildly lower lung volumes.  Patchy left lung base opacity, favor atelectasis but left lung base pneumonia not excluded.   Original Report Authenticated By: Erskine Speed, M.D.     Review of Systems  Constitutional: Positive for fever, chills and diaphoresis.  HENT: Negative for sore throat.   Respiratory: Positive for shortness of breath.   Cardiovascular: Positive for chest pain (Described as "indigestion") and  palpitations. Negative for orthopnea and leg swelling.  Gastrointestinal: Positive for abdominal pain. Negative for nausea and vomiting.   Blood pressure 95/51, pulse 88, temperature 99.8 F (37.7 C), temperature source Core (Comment), resp. rate 17, height 5\' 4"  (1.626 m), weight 64 kg (141 lb 1.5 oz), SpO2 100.00%. Physical Exam  Constitutional: She is oriented to person, place, and time. She appears well-developed and well-nourished. No distress.  HENT:  Head: Normocephalic and atraumatic.  Eyes: EOM are normal. Pupils are equal, round, and reactive to light. No scleral icterus.  Neck: Normal range of motion.  Cardiovascular: Normal rate, regular rhythm, S1 normal and S2 normal.   Murmur heard.  Systolic murmur is present with a grade of 1/6  Pulses:      Radial pulses are 2+ on the right side, and 2+ on the left side.       Dorsalis pedis pulses are 2+ on the right side, and 2+ on the left side.  Respiratory: Effort normal. She has no wheezes. She has no rales.  GI: Soft. Bowel sounds are normal. There is tenderness (Mildly).  Musculoskeletal: She exhibits no edema.  Neurological: She is alert and oriented to person, place, and time.  Skin: Skin is warm and dry.  Psychiatric: She has a normal mood and affect.    Assessment/Plan: 1.  Sepsis-resolved 2. UTI 3. SVT- now NSR 4. NSTEMI  Mildly elevated trop 5. Hx PAF/atrial tach  Plan:  NSTEMI likely from demand ischemia during SVT. Off pressors currently.  Nonischemic NST last May.  She will likely be medical management with increased BB when stable and able to resume meds.   HAGER, BRYAN 03/26/2013, 11:09  AM      Agree with note written by Jones Skene [facility]  Elderly female pt of DF with urosepsis, shock, SVT with ST depression prob from demand ischemia. Neg myoview last year and nl LV fxn. Gets occasional CP. Now off pressors getting ATBX. Baseline EKG in NSR w/o acute changes. Exam benign. Trop mildly elevated (Type 2 MI).  Plan medical treatment for now. Urosepsis improving on medical therapy. IV hep for now given + troponin. No plans for invasive strategy.   Runell Gess 03/26/2013 12:20 PM

## 2013-03-27 ENCOUNTER — Encounter (HOSPITAL_COMMUNITY): Payer: Self-pay | Admitting: Radiology

## 2013-03-27 ENCOUNTER — Inpatient Hospital Stay (HOSPITAL_COMMUNITY): Payer: Medicare Other

## 2013-03-27 DIAGNOSIS — J690 Pneumonitis due to inhalation of food and vomit: Secondary | ICD-10-CM | POA: Diagnosis present

## 2013-03-27 DIAGNOSIS — R7881 Bacteremia: Secondary | ICD-10-CM | POA: Diagnosis present

## 2013-03-27 DIAGNOSIS — N390 Urinary tract infection, site not specified: Secondary | ICD-10-CM | POA: Diagnosis present

## 2013-03-27 DIAGNOSIS — R6521 Severe sepsis with septic shock: Secondary | ICD-10-CM | POA: Diagnosis present

## 2013-03-27 DIAGNOSIS — A419 Sepsis, unspecified organism: Secondary | ICD-10-CM | POA: Diagnosis present

## 2013-03-27 DIAGNOSIS — B9689 Other specified bacterial agents as the cause of diseases classified elsewhere: Secondary | ICD-10-CM

## 2013-03-27 DIAGNOSIS — N189 Chronic kidney disease, unspecified: Secondary | ICD-10-CM | POA: Diagnosis present

## 2013-03-27 DIAGNOSIS — N179 Acute kidney failure, unspecified: Secondary | ICD-10-CM

## 2013-03-27 LAB — CBC WITH DIFFERENTIAL/PLATELET
Basophils Absolute: 0 10*3/uL (ref 0.0–0.1)
Eosinophils Absolute: 0.2 10*3/uL (ref 0.0–0.7)
Eosinophils Relative: 2 % (ref 0–5)
MCH: 30.6 pg (ref 26.0–34.0)
MCV: 86.4 fL (ref 78.0–100.0)
Platelets: 128 10*3/uL — ABNORMAL LOW (ref 150–400)
RDW: 14.1 % (ref 11.5–15.5)

## 2013-03-27 LAB — COMPREHENSIVE METABOLIC PANEL
ALT: 38 U/L — ABNORMAL HIGH (ref 0–35)
AST: 32 U/L (ref 0–37)
Albumin: 2.4 g/dL — ABNORMAL LOW (ref 3.5–5.2)
Alkaline Phosphatase: 71 U/L (ref 39–117)
Chloride: 104 mEq/L (ref 96–112)
Potassium: 3.7 mEq/L (ref 3.5–5.1)
Sodium: 133 mEq/L — ABNORMAL LOW (ref 135–145)
Total Bilirubin: 0.2 mg/dL — ABNORMAL LOW (ref 0.3–1.2)

## 2013-03-27 MED ORDER — METOPROLOL SUCCINATE 12.5 MG HALF TABLET
12.5000 mg | ORAL_TABLET | Freq: Every day | ORAL | Status: DC
  Administered 2013-03-27 – 2013-03-29 (×3): 12.5 mg via ORAL
  Filled 2013-03-27 (×3): qty 1

## 2013-03-27 MED ORDER — LEVOTHYROXINE SODIUM 75 MCG PO TABS
75.0000 ug | ORAL_TABLET | Freq: Every day | ORAL | Status: DC
  Administered 2013-03-28 – 2013-04-06 (×8): 75 ug via ORAL
  Filled 2013-03-27 (×11): qty 1

## 2013-03-27 MED ORDER — OXYCODONE-ACETAMINOPHEN 5-325 MG PO TABS
2.0000 | ORAL_TABLET | ORAL | Status: DC | PRN
  Administered 2013-03-27 – 2013-03-29 (×4): 2 via ORAL
  Administered 2013-03-30 (×2): 1 via ORAL
  Administered 2013-03-31 – 2013-04-01 (×5): 2 via ORAL
  Filled 2013-03-27 (×6): qty 2
  Filled 2013-03-27: qty 1
  Filled 2013-03-27 (×5): qty 2

## 2013-03-27 MED ORDER — PANTOPRAZOLE SODIUM 40 MG PO TBEC
40.0000 mg | DELAYED_RELEASE_TABLET | Freq: Every day | ORAL | Status: DC
  Administered 2013-03-27 – 2013-03-28 (×2): 40 mg via ORAL
  Filled 2013-03-27 (×3): qty 1

## 2013-03-27 NOTE — Progress Notes (Signed)
Utilization review completed.  P.J. Bodie Abernethy,RN,BSN Case Manager [phone]  

## 2013-03-27 NOTE — Progress Notes (Signed)
[facility]  Subjective: No complaints. Denies chest pain or palpitations.  Objective: Vital signs in last 24 hours: Temp:  [97.2 F (36.2 C)-99.7 F (37.6 C)] 98.9 F (37.2 C) (04/21 0800) Pulse Rate:  [74-104] 80 (04/21 0800) Resp:  [11-22] 14 (04/21 0800) BP: (92-140)/(41-96) 129/69 mmHg (04/21 0800) SpO2:  [93 %-100 %] 100 % (04/21 0800) Last BM Date: 03/21/13  Intake/Output from previous day: 04/20 0701 - 04/21 0700 In: 703 [P.O.:200; I.V.:253; IV Piggyback:250] Out: 1550 [Urine:1550] Intake/Output this shift: Total I/O In: 10 [I.V.:10] Out: -   Medications Current Facility-Administered Medications  Medication Dose Route Frequency Provider Last Rate Last Dose  . 0.9 %  sodium chloride infusion  250 mL Intravenous PRN Curlene Dolphin, MD 10 mL/hr at 03/26/13 2000 250 mL at 03/26/13 2000  . acetaminophen (TYLENOL) tablet 650 mg  650 mg Oral Q6H PRN Carolan Clines, MD   650 mg at 03/27/13 0655  . aspirin chewable tablet 81 mg  81 mg Oral Daily Curlene Dolphin, MD   81 mg at 03/26/13 0944  . ceFEPIme (MAXIPIME) 1 g in dextrose 5 % 50 mL IVPB  1 g Intravenous Q24H Curlene Dolphin, MD   1 g at 03/26/13 2116  . docusate sodium (ENEMEEZ) enema 283 mg  1 enema Rectal Daily PRN Nelda Bucks, MD   283 mg at 03/26/13 1349  . heparin injection 5,000 Units  5,000 Units Subcutaneous Q8H Curlene Dolphin, MD   5,000 Units at 03/27/13 [id number]  . pantoprazole (PROTONIX) injection 40 mg  40 mg Intravenous QHS Curlene Dolphin, MD   40 mg at 03/26/13 2118  . senna-docusate (Senokot-S) tablet 1 tablet  1 tablet Oral BID Kalman Shan, MD   1 tablet at 03/26/13 2116  . traZODone (DESYREL) tablet 100 mg  100 mg Oral QHS Ky Barban, MD   100 mg at 03/26/13 2308  . vancomycin (VANCOCIN) IVPB 750 mg/150 ml premix  750 mg Intravenous Q24H Curlene Dolphin, MD   750 mg at 03/26/13 1408    PE: General appearance:  alert, cooperative and no distress Lungs: clear to auscultation bilaterally Heart: regular rate and rhythm Extremities: no LEE Pulses: 2+ and symmetric Skin: warm and dry Neurologic: Grossly normal  Lab Results:   Recent Labs  03/26/13 0240 03/26/13 0430 03/27/13 0400  WBC 18.6* 16.3* 11.1*  HGB 9.7* 10.6* 9.2*  HCT 28.4* 30.6* 26.0*  PLT 151 164 128*   BMET  Recent Labs  03/25/13 2034 03/25/13 2055 03/26/13 0240 03/26/13 0430 03/27/13 0400  NA 132* 136  --  136 133*  K 3.5 3.5  --  4.3 3.7  CL 98 103  --  105 104  CO2 23  --   --  22 22  GLUCOSE 101* 103*  --  136* 109*  BUN 30* 30*  --  29* 24*  CREATININE 1.81* 1.80* 1.73* 1.70* 1.30*  CALCIUM 8.7  --   --  7.9* 8.6   PT/INR  Recent Labs  03/26/13 0240  LABPROT 15.1  INR 1.21   Cardiac Enzymes Cardiac Panel (last 3 results)  Recent Labs  03/26/13 0430 03/26/13 1036 03/26/13 1636  TROPONINI 0.67* 0.65* 0.41*    Assessment/Plan  Active Problems: 1. Sepsis-resolved  2. UTI  3. SVT- now NSR  4. NSTEMI Mildly elevated trop  5. Hx PAF/atrial tach  Plan: Maintaining NSR on telemetry. Did have short run of SVT early this  AM. HR in the 80s. Troponins have been trending downward. In the setting of SVT, elevation may be secondary to demand ischemia. Her last NST was in May 2013. Consider repeating another NST once urosepsis resolves. She continues on antibiotics and appears to be improving. Will continue to follow.     LOS: 2 days    Brittainy M. Delmer Islam 03/27/2013 8:22 AM  I have seen and evaluated the patient this AM along with Boyce Medici, PA. I agree with her findings, examination as well as impression recommendations.  Mild troponin elevation in setting of extreme tachycardia from SVT & ST depressions.  Clearly supply vs. Demand ischemia. Would consider OP Myoview once stable.    As BP normalizes, would initiate BB (start with Lopressor 12.5 mg BID) coverage to combat PVCs,PACs &  reduce likelihood of SVT recurrence -- defer timing to PCCM.  Was on Toprol XL 37.5 mg as OP for h/o Atrial Tachycardia (SVT) & may be having a "rebound" effect.  Should be able to return to home dose of XL Metoprolol for d/c.    I also agree with avoiding Beta Agonists if pressors are required again.   Marykay Lex, M.D., M.S. [facility] [address] [place], [place]  [address]  [phone] Pager # [phone] 03/27/2013 10:13 AM

## 2013-03-27 NOTE — Progress Notes (Signed)
Pt to be transferred to unit 2000, room 2040.  Report called to Andrey Spearman, RN.  Will continue to monitor.

## 2013-03-27 NOTE — Progress Notes (Addendum)
PULMONARY  / CRITICAL CARE MEDICINE  Name: Chelsea Lynch MRN: [id number] DOB: 1925-11-20    ADMISSION DATE:  03/25/2013  PRIMARY SERVICE: PCCM  CHIEF COMPLAINT:  Altered mental status  BRIEF PATIENT DESCRIPTION:  79 years old female presents with AMS, urinary symptoms and hypotension. SVT  LINES / TUBES: - Right IJ CVC 4/20>>> - Peripheral IV's  CULTURES: - Blood 03/26/13 >>> - Urine 03/26/13 >>>GNR - Sputum 03/26/13 >>  ANTIBIOTICS: - Cefepime 03/26/13>>> -vanc 4/20>>>  SUBJECTIVE:  No SVt last night.  She was complaining of pain and received oxycodone.   VITAL SIGNS: Temp:  [97.2 F (36.2 C)-99.8 F (37.7 C)] 98.5 F (36.9 C) (04/21 0600) Pulse Rate:  [74-104] 92 (04/21 0600) Resp:  [11-22] 15 (04/21 0600) BP: (92-132)/(41-86) 132/77 mmHg (04/21 0600) SpO2:  [93 %-100 %] 100 % (04/21 0600) CVP:  [8 mmHg-14 mmHg] 14 mmHg VENTILATOR SETTINGS:   INTAKE / OUTPUT: Intake/Output     04/20 0701 - 04/21 0700 04/21 0701 - 04/22 0700   P.O. 200    I.V. (mL/kg) 243 (3.8)    IV Piggyback 250    Total Intake(mL/kg) 693 (10.8)    Urine (mL/kg/hr) 1425 (0.9)    Total Output 1425     Net -732            PHYSICAL EXAMINATION: General: Pleasant female patient. No distress ENT: Oropharynx clear.  Heart: Normal S1, S2. No murmurs, rubs, or gallops appreciated. No bruits, equal pulses. Lungs: reduced Abdomen: Abdomen soft, non-tender and not distended, normoactive bowel sounds. No hepatosplenomegaly or masses. Musculoskeletal: No clubbing or synovitis. Skin: No rashes or lesions Neuro: No focal neurologic deficits. Awake, alert, oriented x 2  LABS:  Recent Labs Lab 03/25/13 2034 03/25/13 2035 03/25/13 2055 03/26/13 0240 03/26/13 0430 03/26/13 1036 03/26/13 1039 03/26/13 1636 03/27/13 0400  HGB 11.9*  --  12.6 9.7* 10.6*  --   --   --  9.2*  WBC 10.9*  --   --  18.6* 16.3*  --   --   --  11.1*  PLT 151  --   --  151 164  --   --   --  128*  NA 132*  --  136   --  136  --   --   --  133*  K 3.5  --  3.5  --  4.3  --   --   --  3.7  CL 98  --  103  --  105  --   --   --  104  CO2 23  --   --   --  22  --   --   --  22  GLUCOSE 101*  --  103*  --  136*  --   --   --  109*  BUN 30*  --  30*  --  29*  --   --   --  24*  CREATININE 1.81*  --  1.80* 1.73* 1.70*  --   --   --  1.30*  CALCIUM 8.7  --   --   --  7.9*  --   --   --  8.6  MG  --   --   --   --  1.5  --   --   --   --   PHOS  --   --   --   --  3.7  --   --   --   --  AST 16  --   --   --   --   --   --   --  32  ALT 10  --   --   --   --   --   --   --  38*  ALKPHOS 87  --   --   --   --   --   --   --  71  BILITOT 0.7  --   --   --   --   --   --   --  0.2*  PROT 6.6  --   --   --   --   --   --   --  5.5*  ALBUMIN 3.1*  --   --   --   --   --   --   --  2.4*  APTT  --   --   --  34  --   --   --   --   --   INR  --   --   --  1.21  --   --   --   --   --   LATICACIDVEN  --  2.0  --   --   --   --   --   --   --   TROPONINI  --   --   --   --  0.67* 0.65*  --  0.41*  --   PROCALCITON  --   --   --  15.16  --   --   --   --   --   PROBNP  --  7189.0*  --   --   --   --   --   --   --   PHART  --   --   --   --  7.285*  --  7.340*  --   --   PCO2ART  --   --   --   --  40.7  --  34.7*  --   --   PO2ART  --   --   --   --  73.4*  --  105.0*  --   --     Recent Labs Lab 03/26/13 0315  GLUCAP 129*    CXR: ATx mild, line wnl  ASSESSMENT / PLAN: Principal Problem:   Septic shock(785.52) Active Problems:   BACK PAIN, CHRONIC   Urosepsis   Bacteremia due to Gram-negative bacteria   Aspiration pneumonia   Acute renal failure   PULMONARY A: 1) Acute hypoxemia - Improving  2) LLL infiltrate, atelectasis vs pneumonic infiltrate P:   - sputum culture pending - Continue supplemental oxygen via NRB -Continue with Vanc and Cefepime>>narrow abx once final c/s is available  CARDIOVASCULAR A:  1) Severe sepsis, normal lactic acid 2) Likely source is UTI but cannot rule out  pneumonia.  3) Elevated troponin, likely demand ischemia,  4 SVt (levo contribution/ sepsis) - cortisol 18.1, tsh 0.532, ProBNP 7198 P:  - Not on pressors  - On abx for 14 days course  -- Echo results pending  - cards following   RENAL A:   1) Acute renal failure, likely pre renal. Improving with Cr 1.81 >>1.30. Negative fluid balance 1L. Good UOP.  P:   - cvp 14, to kvo - Trend BMET -Will allow negative balance for sepsis -cautious hydration pending Echo   GASTROINTESTINAL A:   1) GERD P:   -  PO protonix  HEMATOLOGIC A:   1) Mild anemia, Hgb 9.2 P:  - No indication for transfusion - DVT prophylaxis with SQ heparin - SCD's  INFECTIOUS A:   1) Severe sepsis - improving 2) UTI 3) Questionable LLL infiltrates (not impressed) P:   - Cefepime and Vanc.  -Total antibiotic therapy of 10 - 14 days with transition to PO soon - Will follow cultures- Urine with GNR - Follow C/S -Consider removal of Central line.  ENDOCRINE A:   1) Hypothyroidism. Tsh normal 0.532 P:   - resume synthroid at po daily NEUROLOGIC A:   1) Altered mental status, improved. Back pain. On home Oxycodone and Percocet P:   - Continue monitoring -restart regular home pain meds   - The patient is full code  TODAYS SUMMARY: Uti/pyelonephtritis with septic shock and GNR in [facility]. Now out of shock state. Plan tfr to floor and cont ABX, get PT /OT consults. Tfr to [facility] to pick up 4/22  Signed:  Dow Adolph, MD PGY-1 Internal Medicine Teaching Service Pager: [phone] 03/27/2013, 7:34 AM    Family updated at bedside. Will tfr to floor and ask [facility] to assume primary care in AM 4/22.   Pt seen with resident and agree with above note. Dorcas Carrow  Beeper  [phone]  Cell  [phone]  If no response or cell goes to voicemail, call beeper [phone]

## 2013-03-27 NOTE — Evaluation (Signed)
Physical Therapy Evaluation Patient Details Name: Chelsea Lynch MRN: [id number] DOB: December 18, 1924 Today's Date: 03/27/2013 Time: 1450-1550 PT Time Calculation (min): 60 min  PT Assessment / Plan / Recommendation Clinical Impression  HIghly motivated pt adm for sepsis with prior h/o CVA now presents with generalized weakness from prolonged illness. Will benefit from PT to incr safety with mobility (she wants to use a 4 wheeled RW at home) and prepare for d/c home with family.    PT Assessment  Patient needs continued PT services    Follow Up Recommendations  Home health PT;Supervision/Assistance - 24 hour    Does the patient have the potential to tolerate intense rehabilitation      Barriers to Discharge None      Equipment Recommendations  Other (comment) (4 wheeled Rolling walker--pt wants information on whether insurance will pay for this prior to ordering)    Recommendations for Other Services OT consult   Frequency Min 3X/week    Precautions / Restrictions Precautions Precautions: Fall   Pertinent Vitals/Pain no apparent distress       Mobility  Bed Mobility Bed Mobility: Supine to Sit;Sitting - Scoot to [place] of Bed;Sit to Supine Supine to Sit: 4: Min assist;With rails Sitting - Scoot to [place] of Bed: 5: Supervision Sit to Supine: 4: Min assist;HOB flat Details for Bed Mobility Assistance: assisted from BR into bed; once settled, she needed to use BR again and assisted OOB to BR and then back to bed again; mostly needed help with LLE due to weakness from prior CVA Transfers Transfers: Sit to Stand;Stand to Sit Sit to Stand: 4: Min assist;With upper extremity assist Stand to Sit: 4: Min assist;With upper extremity assist Details for Transfer Assistance: x 3; vc for safe use of RW (locking brakes, pushing off surface, fully turning to back up to surface); assist for balance/control Ambulation/Gait Ambulation/Gait Assistance: 4: Min assist Ambulation Distance (Feet):  45 Feet (15 x 3) Assistive device: 4-wheeled walker;1 person hand held assist Ambulation/Gait Assistance Details: Rt HHA x 15 ft with pt reaching to hold onto objects with LUE; husband arrived with HIS 4 wheeled walker and she used this for next trip to the BR; she tends to push it too far ahead with poor control/steering Gait Pattern: Step-to pattern;Decreased stride length;Trunk flexed    Exercises     PT Diagnosis: Difficulty walking;Generalized weakness  PT Problem List: Decreased strength;Decreased balance;Decreased mobility;Decreased cognition;Decreased knowledge of use of DME;Decreased safety awareness PT Treatment Interventions: DME instruction;Gait training;Functional mobility training;Therapeutic activities;Therapeutic exercise;Balance training;Cognitive remediation;Patient/family education   PT Goals Acute Rehab PT Goals PT Goal Formulation: With patient/family Time For Goal Achievement: 04/03/13 Potential to Achieve Goals: Good Pt will go Supine/Side to Sit: with supervision;with HOB 0 degrees PT Goal: Supine/Side to Sit - Progress: Goal set today Pt will go Sit to Supine/Side: with supervision;with HOB 0 degrees PT Goal: Sit to Supine/Side - Progress: Goal set today Pt will go Sit to Stand: with supervision;with cues (comment type and amount) PT Goal: Sit to Stand - Progress: Goal set today Pt will go Stand to Sit: with supervision;with cues (comment type and amount) PT Goal: Stand to Sit - Progress: Goal set today Pt will Ambulate: 16 - 50 feet;with supervision;with least restrictive assistive device PT Goal: Ambulate - Progress: Goal set today  Visit Information  Last PT Received On: 03/27/13 Assistance Needed: +1    Subjective Data  Subjective: I want to go home. Patient Stated Goal: home to her 'ball player" (husband)  Prior Functioning  Home Living Lives With: Spouse Available Help at Discharge: Family;Personal care attendant;Available 24 hours/day Type of  Home: House Bathroom Shower/Tub: Walk-in shower Bathroom Toilet: Handicapped height Bathroom Accessibility: Yes How Accessible: Accessible via walker Home Adaptive Equipment: Bedside commode/3-in-1;Quad cane;Shower chair with back;Walker - rolling;Other (comment) (transport chair) Additional Comments: caregivers nearly 24/7 (sometimes husband only from 8:30 a.m to 10:30 am) Prior Function Level of Independence: Independent with assistive device(s) Communication Communication: No difficulties    Cognition  Cognition Arousal/Alertness: Awake/alert Behavior During Therapy: WFL for tasks assessed/performed Overall Cognitive Status: Impaired/Different from baseline Area of Impairment: Memory General Comments: realized she was "foggy" and incr time to respond; also unsure of some ?s re: home environment    Extremity/Trunk Assessment Right Lower Extremity Assessment RLE ROM/Strength/Tone: Deficits RLE ROM/Strength/Tone Deficits: AROM WFL; strength 3+-4/5 Left Lower Extremity Assessment LLE ROM/Strength/Tone: Deficits LLE ROM/Strength/Tone Deficits: AAROM WFL; knee ext 3/5, hip flexion 2+/5 Trunk Assessment Trunk Assessment: Kyphotic   Balance Balance Balance Assessed: Yes Static Standing Balance Static Standing - Balance Support: No upper extremity supported;During functional activity Static Standing - Level of Assistance: 4: Min assist Static Standing - Comment/# of Minutes: at sink washing hands; min assist for balance  End of Session PT - End of Session Equipment Utilized During Treatment: Gait belt Activity Tolerance: Patient tolerated treatment well Patient left: in bed;with call bell/phone within reach;with family/visitor present Nurse Communication: Mobility status;Other (comment) (Rt IJ line tangled in bedrail with tug to it as moving)  GP     Cezar Misiaszek 03/27/2013, 4:14 PM   03/27/2013 Veda Canning, PT Pager: [phone]

## 2013-03-27 NOTE — Progress Notes (Signed)
eLink Physician-Brief Progress Note Patient Name: Chelsea Lynch DOB: 1924-12-11 MRN: [id number]  Date of Service  03/27/2013   HPI/Events of Note   pain  eICU Interventions  Home perc   Intervention Category Minor Interventions: Routine modifications to care plan (e.g. PRN medications for pain, fever)  Nelda Bucks. 03/27/2013, 7:26 PM

## 2013-03-28 DIAGNOSIS — E039 Hypothyroidism, unspecified: Secondary | ICD-10-CM | POA: Diagnosis present

## 2013-03-28 DIAGNOSIS — I471 Supraventricular tachycardia: Secondary | ICD-10-CM | POA: Diagnosis present

## 2013-03-28 DIAGNOSIS — D649 Anemia, unspecified: Secondary | ICD-10-CM | POA: Diagnosis present

## 2013-03-28 DIAGNOSIS — K219 Gastro-esophageal reflux disease without esophagitis: Secondary | ICD-10-CM | POA: Diagnosis present

## 2013-03-28 DIAGNOSIS — R9389 Abnormal findings on diagnostic imaging of other specified body structures: Secondary | ICD-10-CM | POA: Diagnosis present

## 2013-03-28 LAB — CBC
MCH: 30 pg (ref 26.0–34.0)
Platelets: 152 10*3/uL (ref 150–400)
RBC: 3.27 MIL/uL — ABNORMAL LOW (ref 3.87–5.11)
RDW: 13.7 % (ref 11.5–15.5)

## 2013-03-28 LAB — BASIC METABOLIC PANEL
Calcium: 8.7 mg/dL (ref 8.4–10.5)
Creatinine, Ser: 1.09 mg/dL (ref 0.50–1.10)
GFR calc non Af Amer: 45 mL/min — ABNORMAL LOW (ref >90)
Glucose, Bld: 110 mg/dL — ABNORMAL HIGH (ref 70–99)
Sodium: 136 mEq/L (ref 135–145)

## 2013-03-28 LAB — URINE CULTURE: Colony Count: 50000

## 2013-03-28 LAB — GLUCOSE, CAPILLARY: Glucose-Capillary: 111 mg/dL — ABNORMAL HIGH (ref 70–99)

## 2013-03-28 MED ORDER — POTASSIUM CHLORIDE CRYS ER 20 MEQ PO TBCR
20.0000 meq | EXTENDED_RELEASE_TABLET | Freq: Two times a day (BID) | ORAL | Status: DC
  Administered 2013-03-28: 20 meq via ORAL
  Filled 2013-03-28 (×3): qty 1

## 2013-03-28 MED ORDER — LORAZEPAM 2 MG/ML IJ SOLN
1.0000 mg | Freq: Once | INTRAMUSCULAR | Status: AC
  Administered 2013-03-28: 1 mg via INTRAVENOUS
  Filled 2013-03-28: qty 1

## 2013-03-28 NOTE — Progress Notes (Signed)
Called by bedside RN at 2030 regarding patient's respiratory status and family concerns. Upon arrival to room, patient's visitors state that patient developed shortness of breath around 1700. Patient lying in bed, VSS, complaining of 'black lace' all over her, family is unaware of what this means. Patient is able to speaking in full sentences and denies chest pain. Advised bedside RN to contact MD regarding patient and family's concerns. Will continue to monitor

## 2013-03-28 NOTE — Progress Notes (Signed)
[facility] PROGRESS NOTE  Chelsea Lynch ZOX:[id number] DOB: 1925-06-27 DOA: 03/25/2013 PCP: [name], MD  Brief narrative: Chelsea Lynch is an 79 y.o. female with a past medical history of hypothyroidism, depression, anxiety, and chronic pain who was admitted on 03/26/2013 with altered mental status, hypotension, and septic shock from a urinary source.  Assessment/Plan: Principal Problem:   Septic shock(785.52) / urosepsis / bacteremia do to gram-negative bacteria -Source most likely from UTI given gram-negative rod bacteremia and Escherichia coli positive urine culture. -14 day course of antibiotics recommended, will choose oral agent once final cultures back. -Continue empiric cefepime, discontinue vancomycin. -Escherichia coli in urine is Cipro sensitive. Blood cultures grow the same organism, can switch to oral Cipro and discharge home on oral therapy. Active Problems:   Hypokalemia -Supplement.   Altered mental status -Status appears to be back to baseline.   Hypothyroidism -TSH within normal limits. Continue Synthroid.   Normocytic anemia -Hemoglobin stable with no indication for transfusion.   Gastroesophageal reflux disease -Continue Protonix.   BACK PAIN, CHRONIC -Supportive care.   Acute renal failure -Likely prerenal, creatinine improved from 1.81. Good urine output. -Resolved.   Left lower lobe infiltrate rule out aspiration pneumonia -Atelectasis versus pneumonia. On broad-spectrum antibiotics for now. -Sputum culture pending.   Elevated troponin / SVT -Likely from demand ischemia in the setting of sepsis. -Likely from sepsis. Cortisol 18.1, TSH 0.532, proBNP 7198. -Cardiologist is following with plans to increase beta blocker dose to home dose. -Outpatient myocardial perfusion study recommended.   Code Status: Full. Family Communication: Husband and daughter updated at bedside. Disposition Plan: Home soon.   Medical Consultants:  Dr.  Thurmon Fair, Cardiology  Dr. Shan Levans, PCCM  Other Consultants:  Occupational therapy: Home OT.   physical therapy: Home health PT with 24-hour supervision recommended.  Anti-infectives:  Cefepime 03/26/2013 --->  Vancomycin 03/26/2013 --->  HPI/Subjective: Chelsea Lynch is without complaints of dyspnea or cough. She has some chronic back pain issues. Feels weak and deconditioned, but otherwise no specific complaints. She is chronically incontinent of urine and sees a urologist for ongoing care for this.  Objective: Filed Vitals:   03/27/13 2054 03/28/13 0500 03/28/13 0513 03/28/13 0900  BP: 157/78  141/87   Pulse: 76  85 85  Temp: 98 F (36.7 C)  98.6 F (37 C)   TempSrc: Oral  Oral   Resp: 18  19   Height:      Weight:  62.4 kg (137 lb 9.1 oz)    SpO2: 97%  97% 100%    Intake/Output Summary (Last 24 hours) at 03/28/13 1315 Last data filed at 03/27/13 1700  Gross per 24 hour  Intake    240 ml  Output      0 ml  Net    240 ml    Exam Gen:  NAD Cardiovascular:  RRR, No M/R/G Respiratory:  Lungs CTAB Gastrointestinal:  Abdomen soft, NT/ND, + BS Extremities:  No C/E/C  Data Reviewed: Basic Metabolic Panel:  Recent Labs Lab 03/25/13 2034 03/25/13 2055 03/26/13 0240 03/26/13 0430 03/27/13 0400 03/28/13 0500  NA 132* 136  --  136 133* 136  K 3.5 3.5  --  4.3 3.7 3.4*  CL 98 103  --  105 104 105  CO2 23  --   --  22 22 23   GLUCOSE 101* 103*  --  136* 109* 110*  BUN 30* 30*  --  29* 24* 15  CREATININE 1.81* 1.80* 1.73* 1.70*  1.30* 1.09  CALCIUM 8.7  --   --  7.9* 8.6 8.7  MG  --   --   --  1.5  --   --   PHOS  --   --   --  3.7  --   --    GFR Estimated Creatinine Clearance: 32 ml/min (by C-G formula based on Cr of 1.09). Liver Function Tests:  Recent Labs Lab 03/25/13 2034 03/27/13 0400  AST 16 32  ALT 10 38*  ALKPHOS 87 71  BILITOT 0.7 0.2*  PROT 6.6 5.5*  ALBUMIN 3.1* 2.4*   Coagulation profile  Recent Labs Lab 03/26/13 0240   INR 1.21    CBC:  Recent Labs Lab 03/25/13 2034 03/25/13 2055 03/26/13 0240 03/26/13 0430 03/27/13 0400 03/28/13 0500  WBC 10.9*  --  18.6* 16.3* 11.1* 9.0  NEUTROABS 9.6*  --   --   --  8.9*  --   HGB 11.9* 12.6 9.7* 10.6* 9.2* 9.8*  HCT 33.8* 37.0 28.4* 30.6* 26.0* 27.9*  MCV 87.1  --  88.2 88.7 86.4 85.3  PLT 151  --  151 164 128* 152   Cardiac Enzymes:  Recent Labs Lab 03/26/13 0430 03/26/13 1036 03/26/13 1636  TROPONINI 0.67* 0.65* 0.41*   BNP (last 3 results)  Recent Labs  03/25/13 2035  PROBNP 7189.0*   CBG:  Recent Labs Lab 03/26/13 0315  GLUCAP 129*   D-Dimer  Recent Labs  03/25/13 2034  DDIMER 3.80*   Thyroid function studies  Recent Labs  03/26/13 1030  TSH 0.532   Microbiology Recent Results (from the past 240 hour(s))  CULTURE, BLOOD (ROUTINE X 2)     Status: None   Collection Time    03/25/13  8:30 PM      Result Value Range Status   Specimen Description BLOOD RIGHT ARM   Final   Special Requests BOTTLES DRAWN AEROBIC ONLY 10CC   Final   Culture  Setup Time 03/26/2013 03:10   Final   Culture     Final   Value: GRAM NEGATIVE RODS     Note: Gram Stain Report Called to,Read Back By and Verified With: TERESA CRITE 03/26/13 @ 6PM BY RUSCA.   Report Status PENDING   Incomplete  CULTURE, BLOOD (ROUTINE X 2)     Status: None   Collection Time    03/25/13  8:40 PM      Result Value Range Status   Specimen Description BLOOD RIGHT HAND   Final   Special Requests BOTTLES DRAWN AEROBIC AND ANAEROBIC 10CC EA   Final   Culture  Setup Time 03/26/2013 03:10   Final   Culture     Final   Value:        BLOOD CULTURE RECEIVED NO GROWTH TO DATE CULTURE WILL BE HELD FOR 5 DAYS BEFORE ISSUING A FINAL NEGATIVE REPORT   Report Status PENDING   Incomplete  URINE CULTURE     Status: None   Collection Time    03/25/13 10:54 PM      Result Value Range Status   Specimen Description URINE, CATHETERIZED   Final   Special Requests ADDED 2313   Final    Culture  Setup Time 03/26/2013 13:21   Final   Colony Count 50,000 COLONIES/ML   Final   Culture ESCHERICHIA COLI   Final   Report Status 03/28/2013 FINAL   Final   Organism ID, Bacteria ESCHERICHIA COLI   Final  MRSA PCR SCREENING  Status: None   Collection Time    03/26/13  3:17 AM      Result Value Range Status   MRSA by PCR NEGATIVE  NEGATIVE Final   Comment:            The GeneXpert MRSA Assay (FDA     approved for NASAL specimens     only), is one component of a     comprehensive MRSA colonization     surveillance program. It is not     intended to diagnose MRSA     infection nor to guide or     monitor treatment for     MRSA infections.     Procedures and Diagnostic Studies: Dg Chest Port 1 View  03/27/2013  *RADIOLOGY REPORT*  Clinical Data: Shortness of breath.  PORTABLE CHEST - 1 VIEW  Comparison: Chest 03/25/2013 and 03/26/2013.  Findings: There has been increase in bibasilar atelectasis. Cardiomegaly and mild vascular congestion noted.  Dilated aortic arch is unchanged.  No pneumothorax or pleural effusion. Spinal stimulator device in right IJ catheter again noted.  IMPRESSION: Increased bibasilar atelectasis.   Original Report Authenticated By: Holley Dexter, M.D.    Dg Chest Portable 1 View  03/26/2013  *RADIOLOGY REPORT*  Clinical Data: Central line placement.  PORTABLE CHEST - 1 VIEW  Comparison: Chest radiograph performed 03/25/2013, and CT myelogram performed 12/31/2010  Findings: The patient's right IJ line is seen ending about the mid to distal SVC.  Lung expansion is slightly improved.  Vascular congestion is noted. Mild bibasilar opacities likely reflect atelectasis; left basilar airspace opacity has improved.  No definite pleural effusion or pneumothorax is seen.  The cardiomediastinal silhouette is normal in size; calcification is noted within the aortic arch.  Given the pattern of calcification, there is suspicion for mild dilatation of the aortic arch.   No acute osseous abnormalities are seen.  The patient is status post right-sided rotator cuff repair.  Clips are noted within the right upper quadrant, reflecting prior cholecystectomy.  IMPRESSION:  1.  Right IJ line ends about the mid to distal SVC. 2.  Lung expansion has slightly improved; vascular congestion noted.  Mild bibasilar opacities likely reflect atelectasis; left basilar airspace opacity has improved. 3.  Question of mild dilatation of the aortic arch; this appears slightly more prominent than in 2012, when the aortic arch measured 4.0 cm in diameter on CT.   Original Report Authenticated By: Tonia Ghent, M.D.    Dg Chest Port 1 View  03/25/2013  *RADIOLOGY REPORT*  Clinical Data: 79 year old female altered mental status shortness of breath.  PORTABLE CHEST - 1 VIEW  Comparison: Thoracic spine CT 12/31/2010.  CT abdomen 01/17/2010.  Findings: Portable semi upright AP view at 2039 hours.  Stable to mildly lower lung volumes.  Mild left basilar opacity.  Stable cardiac size and mediastinal contours.  Chronic thoracic spinal stimulator lead appears stable.  Stable right upper quadrant surgical clips. Visualized tracheal air column is within normal limits.  Chronic deficiency of the distal right clavicle. No pneumothorax.  No pleural effusion identified.  IMPRESSION: Mildly lower lung volumes.  Patchy left lung base opacity, favor atelectasis but left lung base pneumonia not excluded.   Original Report Authenticated By: Erskine Speed, M.D.     Scheduled Meds: . aspirin  81 mg Oral Daily  . ceFEPime (MAXIPIME) IV  1 g Intravenous Q24H  . heparin  5,000 Units Subcutaneous Q8H  . levothyroxine  75 mcg Oral QAC breakfast  .  metoprolol succinate  12.5 mg Oral Daily  . pantoprazole  40 mg Oral Q1200  . senna-docusate  1 tablet Oral BID  . traZODone  100 mg Oral QHS  . vancomycin  750 mg Intravenous Q24H   Continuous Infusions:   Time spent: 35 MINUTES    LOS: 3 days    Chelsea Lynch  [facility] Pager [phone].  If 8PM-8AM, please contact night-coverage at [email or web address], password [facility] 03/28/2013, 1:15 PM

## 2013-03-28 NOTE — Evaluation (Signed)
Occupational Therapy Evaluation Patient Details Name: Chelsea Lynch MRN: [id number] DOB: 1925-05-28 Today's Date: 03/28/2013 Time: 4098-1191 OT Time Calculation (min): 44 min  OT Assessment / Plan / Recommendation Clinical Impression  Pt is an 79 yo female admitted for septic shock/UTI with overall weakness and deconditioning affecting I with all adls.  Pt would benefit from cont OT to increase I with all adls so pt can d/c home safely with her husband.    OT Assessment  Patient needs continued OT Services    Follow Up Recommendations  Home health OT    Barriers to Discharge None    Equipment Recommendations  None recommended by OT    Recommendations for Other Services    Frequency  Min 2X/week    Precautions / Restrictions Precautions Precautions: Fall Restrictions Weight Bearing Restrictions: No   Pertinent Vitals/Pain Pt with no pain.  O2 sat 100% with no O2.  HR in 80s.    ADL  Eating/Feeding: Performed;Set up Where Assessed - Eating/Feeding: Chair Grooming: Performed;Wash/dry hands;Wash/dry face;Teeth care;Supervision/safety Where Assessed - Grooming: Supported standing Upper Body Bathing: Simulated;Set up Where Assessed - Upper Body Bathing: Supported sitting Lower Body Bathing: Simulated;Minimal assistance Where Assessed - Lower Body Bathing: Supported sit to stand Upper Body Dressing: Performed;Set up Where Assessed - Upper Body Dressing: Unsupported sitting Lower Body Dressing: Performed;Moderate assistance Where Assessed - Lower Body Dressing: Supported sit to Pharmacist, hospital: Performed;Minimal Dentist Method: Surveyor, minerals: Materials engineer and Hygiene: Performed;Supervision/safety Where Assessed - Engineer, mining and Hygiene: Sit on 3-in-1 or toilet Equipment Used: Rolling walker Transfers/Ambulation Related to ADLs: Pt walked in room to sink and then around  bed to chair with walker and S.  S to min assist for transfer to commode b/c pt in a hurry. ADL Comments: Pt needed assist with socks and shoes.  Sometimes she can do this at home, sometimes she cannot.  If she cannot, the caregiver does this for her.    OT Diagnosis: Generalized weakness  OT Problem List: Decreased strength;Impaired balance (sitting and/or standing);Decreased cognition;Decreased knowledge of use of DME or AE OT Treatment Interventions: Self-care/ADL training;Therapeutic activities   OT Goals Acute Rehab OT Goals OT Goal Formulation: With patient/family Time For Goal Achievement: 04/04/13 Potential to Achieve Goals: Good ADL Goals Pt Will Perform Grooming: with modified independence;Standing at sink ADL Goal: Grooming - Progress: Goal set today Pt Will Perform Lower Body Bathing: with supervision;Sitting, chair;Sitting in shower ADL Goal: Lower Body Bathing - Progress: Goal set today Pt Will Perform Lower Body Dressing: with supervision;Sit to stand from chair;Other (comment) (occasional assist with socks and shoes PRN) ADL Goal: Lower Body Dressing - Progress: Goal set today Pt Will Perform Tub/Shower Transfer: with supervision;Shower transfer;Shower seat with back ADL Goal: Web designer - Progress: Goal set today Additional ADL Goal #1: Pt will complete all toileting with S on high commode. ADL Goal: Additional Goal #1 - Progress: Goal set today  Visit Information  Last OT Received On: 03/28/13 Assistance Needed: +1    Subjective Data  Subjective: "I just want to go back home. Patient Stated Goal: to go home today.   Prior Functioning     Home Living Lives With: Spouse Available Help at Discharge: Family;Personal care attendant;Available 24 hours/day Type of Home: House Home Access: Stairs to enter [employer] of Steps: 5 Entrance Stairs-Rails: Right;Left Home Layout: One level Bathroom Shower/Tub: Health visitor:  Handicapped height Bathroom Accessibility: Yes  How Accessible: Accessible via walker Home Adaptive Equipment: Bedside commode/3-in-1;Quad cane;Shower chair with back;Walker - rolling;Other (comment) Additional Comments: caregivers nearly 24/7 (sometimes husband only from 8:30 a.m to 10:30 am) Prior Function Level of Independence: Independent with assistive device(s) Able to Take Stairs?: Yes Driving: No Vocation: Retired Musician: No difficulties Dominant Hand: Right         Vision/Perception Vision - History Baseline Vision: Wears glasses all the time Patient Visual Report: No change from baseline Vision - Assessment Vision Assessment: Vision not tested   [employer] Arousal/Alertness: Awake/alert Behavior During Therapy: WFL for tasks assessed/performed Overall Cognitive Status: History of cognitive impairments - at baseline Area of Impairment: Memory Memory: Decreased recall of precautions General Comments: Pt required increased processing time, cues to figure out what season it was even when provided with the month. Daughter states this is close to baseline.      Extremity/Trunk Assessment Right Upper Extremity Assessment RUE ROM/Strength/Tone: Within functional levels RUE Sensation: WFL - Light Touch RUE Coordination: WFL - gross/fine motor Left Upper Extremity Assessment LUE ROM/Strength/Tone: Within functional levels LUE Sensation: WFL - Light Touch LUE Coordination: WFL - gross/fine motor Trunk Assessment Trunk Assessment: Kyphotic     Mobility Bed Mobility Bed Mobility: Supine to Sit;Sitting - Scoot to Edge of Bed Supine to Sit: 4: Min assist;With rails Sitting - Scoot to [employer] of Bed: 5: Supervision Details for Bed Mobility Assistance: Pt just needed in assist to get totally into sitting position. Transfers Transfers: Sit to Stand;Stand to Sit Sit to Stand: 4: Min assist;With upper extremity assist Stand to Sit: 4: Min  assist;With upper extremity assist Details for Transfer Assistance: cues for pushing off surface she is leaving and reaching back for surface she is going to.  Pt great about backing up all the way to the chair before sitting.     Exercise     Balance Balance Balance Assessed: Yes Static Standing Balance Static Standing - Balance Support: No upper extremity supported;During functional activity Static Standing - Level of Assistance: 5: Stand by assistance Static Standing - Comment/# of Minutes: 8 min brushing teeth and hair.   End of Session OT - End of Session Activity Tolerance: Patient tolerated treatment well Patient left: in chair;with call bell/phone within reach;with family/visitor present Nurse Communication: Mobility status;Other (comment) (O2 sats.  100% w/o O2)  GO     Hope Budds 03/28/2013, 9:53 AM [phone]

## 2013-03-28 NOTE — Care Management (Signed)
Received referral for home health services. I will follow this patient and set her up for RN, PT, OT services upon discharge. Thank you for the referral. Ayesha Rumpf RN [facility]

## 2013-03-28 NOTE — Progress Notes (Signed)
[facility]  DAILY PROGRESS NOTE   Subjective:  No palpitations, dyspnea or angina. Walked to bathroom. BP higher, tolerating low dose beta blocker. Frequent PACs and couplets on monitor, no sustained arrhythmia.  Objective:  Temp:  [98 F (36.7 C)-98.7 F (37.1 C)] 98.6 F (37 C) (04/22 0513) Pulse Rate:  [76-89] 85 (04/22 0900) Resp:  [16-19] 19 (04/22 0513) BP: (132-157)/(76-87) 141/87 mmHg (04/22 0513) SpO2:  [97 %-100 %] 100 % (04/22 0900) Weight:  [62.4 kg (137 lb 9.1 oz)] 62.4 kg (137 lb 9.1 oz) (04/22 0500) Weight change:   Intake/Output from previous day: 04/21 0701 - 04/22 0700 In: 280 [P.O.:240; I.V.:40] Out: 125 [Urine:125]  Intake/Output from this shift:    Medications: Current Facility-Administered Medications  Medication Dose Route Frequency Provider Last Rate Last Dose  . 0.9 %  sodium chloride infusion  250 mL Intravenous PRN Curlene Dolphin, MD 10 mL/hr at 03/26/13 2000 250 mL at 03/26/13 2000  . acetaminophen (TYLENOL) tablet 650 mg  650 mg Oral Q6H PRN Carolan Clines, MD   650 mg at 03/27/13 1845  . aspirin chewable tablet 81 mg  81 mg Oral Daily Curlene Dolphin, MD   81 mg at 03/27/13 1032  . ceFEPIme (MAXIPIME) 1 g in dextrose 5 % 50 mL IVPB  1 g Intravenous Q24H Curlene Dolphin, MD   1 g at 03/27/13 2155  . docusate sodium (ENEMEEZ) enema 283 mg  1 enema Rectal Daily PRN Nelda Bucks, MD   283 mg at 03/26/13 1349  . heparin injection 5,000 Units  5,000 Units Subcutaneous Q8H Curlene Dolphin, MD   5,000 Units at 03/28/13 [phone]  . levothyroxine (SYNTHROID, LEVOTHROID) tablet 75 mcg  75 mcg Oral QAC breakfast Sunday Spillers, MD   75 mcg at 03/28/13 0804  . metoprolol succinate (TOPROL-XL) 24 hr tablet 12.5 mg  12.5 mg Oral Daily Dow Adolph, MD   12.5 mg at 03/27/13 1113  . oxyCODONE-acetaminophen (PERCOCET/ROXICET) 5-325 MG per tablet 2 tablet  2 tablet Oral Q4H PRN Nelda Bucks, MD   2  tablet at 03/28/13 0315  . pantoprazole (PROTONIX) EC tablet 40 mg  40 mg Oral Q1200 Sunday Spillers, MD   40 mg at 03/27/13 1032  . senna-docusate (Senokot-S) tablet 1 tablet  1 tablet Oral BID Kalman Shan, MD   1 tablet at 03/27/13 2140  . traZODone (DESYREL) tablet 100 mg  100 mg Oral QHS Ky Barban, MD   100 mg at 03/27/13 2140  . vancomycin (VANCOCIN) IVPB 750 mg/150 ml premix  750 mg Intravenous Q24H Curlene Dolphin, MD   750 mg at 03/27/13 1518    Physical Exam: General appearance: alert, cooperative and no distress Neck: no adenopathy, no carotid bruit, no JVD, supple, symmetrical, trachea midline and thyroid not enlarged, symmetric, no tenderness/mass/nodules Lungs: clear to auscultation bilaterally Heart: regular rate and rhythm, S1, S2 normal, no murmur, click, rub or gallop and occasional ectopy Abdomen: soft, non-tender; bowel sounds normal; no masses,  no organomegaly Pulses: 2+ and symmetric Skin: Skin color, texture, turgor normal. No rashes or lesions Neurologic: Grossly normal  Lab Results: Results for orders placed during the hospital encounter of 03/25/13 (from the past 48 hour(s))  TSH     Status: None   Collection Time    03/26/13 10:30 AM      Result Value Range   TSH 0.532  0.350 - 4.500 uIU/mL  CORTISOL  Status: None   Collection Time    03/26/13 10:30 AM      Result Value Range   Cortisol, Plasma 18.1     Comment: (NOTE)     AM:  4.3 - 22.4 ug/dL     PM:  3.1 - 16.1 ug/dL  TROPONIN I     Status: Abnormal   Collection Time    03/26/13 10:36 AM      Result Value Range   Troponin I 0.65 (*) <0.30 ng/mL   Comment:            Due to the release kinetics of cTnI,     a negative result within the first hours     of the onset of symptoms does not rule out     myocardial infarction with certainty.     If myocardial infarction is still suspected,     repeat the test at appropriate intervals.     CRITICAL VALUE NOTED.  VALUE IS CONSISTENT  WITH PREVIOUSLY REPORTED AND CALLED VALUE.  POCT I-STAT 3, BLOOD GAS (G3+)     Status: Abnormal   Collection Time    03/26/13 10:39 AM      Result Value Range   pH, Arterial 7.340 (*) 7.350 - 7.450   pCO2 arterial 34.7 (*) 35.0 - 45.0 mmHg   pO2, Arterial 105.0 (*) 80.0 - 100.0 mmHg   Bicarbonate 18.6 (*) 20.0 - 24.0 mEq/L   TCO2 20  0 - 100 mmol/L   O2 Saturation 98.0     Acid-base deficit 6.0 (*) 0.0 - 2.0 mmol/L   Patient temperature 99.6 F     Collection site RADIAL, ALLEN'S TEST ACCEPTABLE     Sample type ARTERIAL    TROPONIN I     Status: Abnormal   Collection Time    03/26/13  4:36 PM      Result Value Range   Troponin I 0.41 (*) <0.30 ng/mL   Comment:            Due to the release kinetics of cTnI,     a negative result within the first hours     of the onset of symptoms does not rule out     myocardial infarction with certainty.     If myocardial infarction is still suspected,     repeat the test at appropriate intervals.     CRITICAL VALUE NOTED.  VALUE IS CONSISTENT WITH PREVIOUSLY REPORTED AND CALLED VALUE.  COMPREHENSIVE METABOLIC PANEL     Status: Abnormal   Collection Time    03/27/13  4:00 AM      Result Value Range   Sodium 133 (*) 135 - 145 mEq/L   Potassium 3.7  3.5 - 5.1 mEq/L   Chloride 104  96 - 112 mEq/L   CO2 22  19 - 32 mEq/L   Glucose, Bld 109 (*) 70 - 99 mg/dL   BUN 24 (*) 6 - 23 mg/dL   Creatinine, Ser 0.96 (*) 0.50 - 1.10 mg/dL   Calcium 8.6  8.4 - 04.5 mg/dL   Total Protein 5.5 (*) 6.0 - 8.3 g/dL   Albumin 2.4 (*) 3.5 - 5.2 g/dL   AST 32  0 - 37 U/L   ALT 38 (*) 0 - 35 U/L   Alkaline Phosphatase 71  39 - 117 U/L   Total Bilirubin 0.2 (*) 0.3 - 1.2 mg/dL   GFR calc non Af Amer 36 (*) >90 mL/min   GFR calc Af Denyse Dago  42 (*) >90 mL/min   Comment:            The eGFR has been calculated     using the CKD EPI equation.     This calculation has not been     validated in all clinical     situations.     eGFR's persistently     <90 mL/min  signify     possible Chronic Kidney Disease.  CBC WITH DIFFERENTIAL     Status: Abnormal   Collection Time    03/27/13  4:00 AM      Result Value Range   WBC 11.1 (*) 4.0 - 10.5 K/uL   RBC 3.01 (*) 3.87 - 5.11 MIL/uL   Hemoglobin 9.2 (*) 12.0 - 15.0 g/dL   HCT 16.1 (*) 09.6 - 04.5 %   MCV 86.4  78.0 - 100.0 fL   MCH 30.6  26.0 - 34.0 pg   MCHC 35.4  30.0 - 36.0 g/dL   RDW 40.9  81.1 - 91.4 %   Platelets 128 (*) 150 - 400 K/uL   Neutrophils Relative 80 (*) 43 - 77 %   Neutro Abs 8.9 (*) 1.7 - 7.7 K/uL   Lymphocytes Relative 8 (*) 12 - 46 %   Lymphs Abs 0.9  0.7 - 4.0 K/uL   Monocytes Relative 10  3 - 12 %   Monocytes Absolute 1.1 (*) 0.1 - 1.0 K/uL   Eosinophils Relative 2  0 - 5 %   Eosinophils Absolute 0.2  0.0 - 0.7 K/uL   Basophils Relative 0  0 - 1 %   Basophils Absolute 0.0  0.0 - 0.1 K/uL  BASIC METABOLIC PANEL     Status: Abnormal   Collection Time    03/28/13  5:00 AM      Result Value Range   Sodium 136  135 - 145 mEq/L   Potassium 3.4 (*) 3.5 - 5.1 mEq/L   Chloride 105  96 - 112 mEq/L   CO2 23  19 - 32 mEq/L   Glucose, Bld 110 (*) 70 - 99 mg/dL   BUN 15  6 - 23 mg/dL   Creatinine, Ser 7.82  0.50 - 1.10 mg/dL   Calcium 8.7  8.4 - 95.6 mg/dL   GFR calc non Af Amer 45 (*) >90 mL/min   GFR calc Af Amer 52 (*) >90 mL/min   Comment:            The eGFR has been calculated     using the CKD EPI equation.     This calculation has not been     validated in all clinical     situations.     eGFR's persistently     <90 mL/min signify     possible Chronic Kidney Disease.  CBC     Status: Abnormal   Collection Time    03/28/13  5:00 AM      Result Value Range   WBC 9.0  4.0 - 10.5 K/uL   RBC 3.27 (*) 3.87 - 5.11 MIL/uL   Hemoglobin 9.8 (*) 12.0 - 15.0 g/dL   HCT 21.3 (*) 08.6 - 57.8 %   MCV 85.3  78.0 - 100.0 fL   MCH 30.0  26.0 - 34.0 pg   MCHC 35.1  30.0 - 36.0 g/dL   RDW 46.9  62.9 - 52.8 %   Platelets 152  150 - 400 K/uL    Imaging: Dg Chest [facility] 1  [address]  03/27/2013  *RADIOLOGY REPORT*  Clinical Data: Shortness of breath.  PORTABLE CHEST - 1 VIEW  Comparison: Chest 03/25/2013 and 03/26/2013.  Findings: There has been increase in bibasilar atelectasis. Cardiomegaly and mild vascular congestion noted.  Dilated aortic arch is unchanged.  No pneumothorax or pleural effusion. Spinal stimulator device in right IJ catheter again noted.  IMPRESSION: Increased bibasilar atelectasis.   Original Report Authenticated By: Holley Dexter, M.D.     Assessment:  1. Principal Problem: 2.   Septic shock(785.52) 3. Active Problems: 4.   BACK PAIN, CHRONIC 5.   Urosepsis 6.   Bacteremia due to Gram-negative bacteria 7.   Aspiration pneumonia 8.   Acute renal failure 9.   Plan:  1. Increase beta blocker dose to home dose tomorrow. 2. Outpatient myocardial perfusion study  Time Spent Directly with Patient:  20 minutes  Length of Stay:  LOS: 3 days    Chelsea Lynch 03/28/2013, 10:08 AM

## 2013-03-28 NOTE — Progress Notes (Signed)
Physical Therapy Treatment Patient Details Name: Chelsea Lynch MRN: [id number] DOB: 1925-02-01 Today's Date: 03/28/2013 Time: 0210-0249 PT Time Calculation (min): 39 min  PT Assessment / Plan / Recommendation Comments on Treatment Session  Pt very pleasant & motivated to progress with mobility.  Moves fairly well.  Increased ambulation distance today.  Pt still requesting for 4-wheel RW at d/c    Follow Up Recommendations  Home health PT;Supervision/Assistance - 24 hour     Does the patient have the potential to tolerate intense rehabilitation     Barriers to Discharge        Equipment Recommendations  Other (comment) (4 wheeled RW)    Recommendations for Other Services OT consult  Frequency Min 3X/week   Plan Discharge plan remains appropriate    Precautions / Restrictions Precautions Precautions: Fall Restrictions Weight Bearing Restrictions: No   Pertinent Vitals/Pain No pain reported    Mobility  Bed Mobility Bed Mobility: Not assessed Transfers Transfers: Sit to Stand;Stand to Sit Sit to Stand: 4: Min assist;With upper extremity assist;With armrests;From chair/3-in-1 Stand to Sit: 4: Min guard;With upper extremity assist;With armrests;To chair/3-in-1 Details for Transfer Assistance: Cues for safest hand placement.  Performed 6x's throughout session.   Ambulation/Gait Ambulation/Gait Assistance: 4: Min guard;4: Min assist Ambulation Distance (Feet): 140 Feet (120' in hallway + 20' in room) Assistive device: 4-wheeled walker;1 person hand held assist Ambulation/Gait Assistance Details: Rt HHA to ambulate around room & utilized 4-wheel RW to ambulate in hallway.   Cues for safe use of AD & to stay closer to AD.   Gait Pattern: Step-through pattern;Decreased stride length (decreased floor clearance) Stairs: No Wheelchair Mobility Wheelchair Mobility: No     PT Goals Acute Rehab PT Goals Time For Goal Achievement: 04/03/13 Potential to Achieve Goals: Good Pt  will go Supine/Side to Sit: with supervision;with HOB 0 degrees Pt will go Sit to Supine/Side: with supervision;with HOB 0 degrees Pt will go Sit to Stand: with supervision;with cues (comment type and amount) PT Goal: Sit to Stand - Progress: Progressing toward goal Pt will go Stand to Sit: with supervision;with cues (comment type and amount) PT Goal: Stand to Sit - Progress: Progressing toward goal Pt will Ambulate: 16 - 50 feet;with supervision;with least restrictive assistive device PT Goal: Ambulate - Progress: Progressing toward goal  Visit Information  Last PT Received On: 03/28/13 Assistance Needed: +1    Subjective Data      Cognition  Cognition Arousal/Alertness: Awake/alert Behavior During Therapy: WFL for tasks assessed/performed Overall Cognitive Status: History of cognitive impairments - at baseline    Balance  Balance Balance Assessed: Yes Static Standing Balance Static Standing - Balance Support: During functional activity;No upper extremity supported Static Standing - Level of Assistance: 5: Stand by assistance Static Standing - Comment/# of Minutes: stood ~10 minutes at sink brushing hair & washing hands.    End of Session PT - End of Session Equipment Utilized During Treatment: Gait belt Activity Tolerance: Patient tolerated treatment well Patient left: in chair;with call bell/phone within reach;with family/visitor present Nurse Communication: Mobility status     Chelsea Lynch, [place] [phone] 03/28/2013

## 2013-03-29 DIAGNOSIS — G9341 Metabolic encephalopathy: Secondary | ICD-10-CM | POA: Diagnosis present

## 2013-03-29 DIAGNOSIS — F1193 Opioid use, unspecified with withdrawal: Secondary | ICD-10-CM | POA: Diagnosis present

## 2013-03-29 DIAGNOSIS — F1123 Opioid dependence with withdrawal: Secondary | ICD-10-CM | POA: Diagnosis present

## 2013-03-29 LAB — BASIC METABOLIC PANEL
CO2: 26 mEq/L (ref 19–32)
Calcium: 9.3 mg/dL (ref 8.4–10.5)
GFR calc Af Amer: 67 mL/min — ABNORMAL LOW (ref >90)
GFR calc non Af Amer: 58 mL/min — ABNORMAL LOW (ref >90)
Sodium: 139 mEq/L (ref 135–145)

## 2013-03-29 LAB — CBC
MCV: 83.9 fL (ref 78.0–100.0)
Platelets: 204 10*3/uL (ref 150–400)
RBC: 3.78 MIL/uL — ABNORMAL LOW (ref 3.87–5.11)
RDW: 13.6 % (ref 11.5–15.5)
WBC: 10.7 10*3/uL — ABNORMAL HIGH (ref 4.0–10.5)

## 2013-03-29 LAB — PROCALCITONIN: Procalcitonin: 1.96 ng/mL

## 2013-03-29 LAB — CULTURE, BLOOD (ROUTINE X 2)

## 2013-03-29 MED ORDER — METOPROLOL SUCCINATE ER 25 MG PO TB24
25.0000 mg | ORAL_TABLET | Freq: Every day | ORAL | Status: DC
  Administered 2013-03-30 – 2013-04-03 (×4): 25 mg via ORAL
  Filled 2013-03-29 (×5): qty 1

## 2013-03-29 MED ORDER — OXYCODONE HCL ER 10 MG PO T12A
10.0000 mg | EXTENDED_RELEASE_TABLET | Freq: Two times a day (BID) | ORAL | Status: DC
  Administered 2013-03-29 – 2013-04-06 (×16): 10 mg via ORAL
  Filled 2013-03-29 (×16): qty 1

## 2013-03-29 MED ORDER — POTASSIUM CHLORIDE IN NACL 40-0.9 MEQ/L-% IV SOLN
INTRAVENOUS | Status: DC
  Administered 2013-03-29 – 2013-03-30 (×2): 100 mL/h via INTRAVENOUS
  Administered 2013-03-31 – 2013-04-01 (×3): via INTRAVENOUS
  Filled 2013-03-29 (×10): qty 1000

## 2013-03-29 MED ORDER — LORAZEPAM 1 MG PO TABS
1.0000 mg | ORAL_TABLET | Freq: Three times a day (TID) | ORAL | Status: DC | PRN
  Administered 2013-03-29 – 2013-04-01 (×9): 1 mg via ORAL
  Filled 2013-03-29 (×9): qty 1

## 2013-03-29 MED ORDER — POTASSIUM CHLORIDE 10 MEQ/50ML IV SOLN
10.0000 meq | INTRAVENOUS | Status: AC
  Administered 2013-03-29 (×5): 10 meq via INTRAVENOUS
  Filled 2013-03-29 (×6): qty 50

## 2013-03-29 MED ORDER — SODIUM CHLORIDE 0.9 % IJ SOLN
10.0000 mL | INTRAMUSCULAR | Status: DC | PRN
  Administered 2013-03-29: 20 mL
  Administered 2013-03-29: 40 mL
  Administered 2013-03-30: 10 mL
  Administered 2013-03-30 – 2013-04-01 (×3): 20 mL
  Administered 2013-04-03 – 2013-04-04 (×8): 10 mL
  Administered 2013-04-05 (×2): 30 mL

## 2013-03-29 MED ORDER — PANTOPRAZOLE SODIUM 40 MG IV SOLR
40.0000 mg | Freq: Every day | INTRAVENOUS | Status: DC
  Administered 2013-03-29 – 2013-04-03 (×6): 40 mg via INTRAVENOUS
  Filled 2013-03-29 (×7): qty 40

## 2013-03-29 MED ORDER — POTASSIUM CHLORIDE CRYS ER 20 MEQ PO TBCR
40.0000 meq | EXTENDED_RELEASE_TABLET | Freq: Once | ORAL | Status: DC
Start: 2013-03-29 — End: 2013-03-29

## 2013-03-29 NOTE — Care Management Note (Signed)
    Page 1 of 2   04/06/2013     4:09:45 PM   CARE MANAGEMENT NOTE 04/06/2013  Patient:  Chelsea Lynch, Chelsea Lynch   Account Number:  [id number]  Date Initiated:  03/27/2013  Documentation initiated by:  Avie Arenas  Subjective/Objective Assessment:   Admitted with AMS - urosepsis     Action/Plan:   Anticipated DC Date:  03/31/2013   Anticipated DC Plan:  HOME W HOME HEALTH SERVICES  In-house referral  Clinical Social Worker      DC Associate Professor  CM consult      [facility] Choice  HOME HEALTH   Choice offered to / List presented to:  C-4 Adult Children   DME arranged  OTHER - SEE COMMENT      DME agency  [facility]     HH arranged  HH-1 RN  HH-2 PT  HH-3 OT      [facility] agency  [facility]   Status of service:  In process, will continue to follow Medicare Important Message given?   (If response is "NO", the following Medicare IM given date fields will be blank) Date Medicare IM given:   Date Additional Medicare IM given:    Discharge Disposition:  HOME W HOME HEALTH SERVICES  Per UR Regulation:  Reviewed for med. necessity/level of care/duration of stay  If discussed at Long Length of Stay Meetings, dates discussed:   03/23/2013  03/28/2013  03/30/2013  04/04/2013    Comments:  ContactLydie, Stammen Spouse [phone]                 Moore,Lisa Daughter [phone]                 White,Suzanne Daughter [phone]  04/05/13 Esha Fincher,RN,BSN [phone] PT FOR DC HOME ON 5/1 PER MD.  PER DAUGHTERS, DECISION IS FOR PT TO DC HOME WITH HH AND 24HR CAREGIVERS.  ONLY DME NEEDED IS ROLLATOR WALKER.  THEY HAVE BSC, TUB SEAT, WHEELCHAIR.  THEY DO NOT WANT HOSPITAL BED.  DAUGHTER GIVEN LIST OF PRIVATE DUTY SITTERS/AIDES, AS THEY STILL HAVE ABOUT 4HRS OF CARE TO FILL.  04/04/13 Nabiha Planck,RN,BSN [phone] DISCUSSED DC PLANS WITH PT'S DAUGHTER SUZANNE([phone]). PTA, PT LIVES WITH ELDERLY HUSBAND, AND HAS CAREGIVERS TO ASSIST.  DTR  IS AFRAID PT MAY NEED SNF FOR REHAB, BUT IS UNDECIDED.  SHE IS AGREEABLE TO HAVING PT FAXED OUT FOR SNF OFFERS AS BACKUP PLAN.  WILL CONSULT CSW.   03/28/13 0935 Henrietta Mayo RN BSN MSN CCM PT/OT recommends home therapy, pt will also need [facility] RN. Discussed home health services with pt and dtr, Rosalita Chessman, provided list of [facility] agencies, referral made per choice.  Per PT, pt needs rollator but wants information on whether insurance will pay first - spouse thinks she was supplied rolling walker through [employer] [facility] within past 5 yrs.  TC to [employer] [facility] - record check reveals previous equipment was purchased > 5 yrs ago, info to family.  03-27-13 1:55pm Jakaylee Sasaki, RNBSN [phone] Lives with elderly husband.  Have caregiver at home. Daughter supportive.  tx out of ICU today - PT/OT ordered. CM will continue to follow.

## 2013-03-29 NOTE — Progress Notes (Addendum)
[facility] PROGRESS NOTE  Chelsea Lynch WGN:[id number] DOB: 23-Dec-1924 DOA: 03/25/2013 PCP: [name], MD  Brief narrative: Chelsea Lynch is an 79 y.o. female with a past medical history of hypothyroidism, depression, anxiety, and chronic pain who was admitted on 03/26/2013 with altered mental status, hypotension, and septic shock from a urinary source.  Assessment/Plan: Principal Problem:   Septic shock(785.52) / urosepsis / bacteremia due to gram-negative bacteria -Source most likely from UTI given gram-negative rod bacteremia and Escherichia coli positive urine culture. -14 day course of antibiotics recommended, will choose oral agent once final cultures back. -Continue empiric cefepime, now off vancomycin. -Clinical deterioration noted overnight. Check pro calcitonin levels. May need to broaden back antibiotics if elevated. Active Problems:   Hypokalemia -Supplement.   Septic encephalopathy, delirium, altered mental status, possible opiate withdrawal -Deterioration of mental status noted overnight. Question narcotics withdrawal. -Underlying encephalopathy from sepsis and delirium.  -Add back OxyContin 10 mg Q 12 hours, and provide supportive care.  Treat underlying conditions.   Hypothyroidism -TSH within normal limits. Continue Synthroid.   Normocytic anemia -Hemoglobin stable with no indication for transfusion.   Gastroesophageal reflux disease -Continue Protonix.   BACK PAIN, CHRONIC -Supportive care.   Acute renal failure -Likely prerenal, creatinine improved from 1.81. Good urine output. -Resolved.   Left lower lobe infiltrate rule out aspiration pneumonia -Atelectasis versus pneumonia. On broad-spectrum antibiotics for now. -Sputum culture pending.   Elevated troponin / SVT -Likely from demand ischemia in the setting of sepsis. -Likely from sepsis. Cortisol 18.1, TSH 0.532, proBNP 7198. -Cardiologist is following with plans to increase beta blocker  dose to home dose. -Outpatient myocardial perfusion study recommended.   Code Status: Full. Family Communication: Daughter updated at bedside. Disposition Plan: Home soon.   Medical Consultants:  Dr. Thurmon Fair, Cardiology  Dr. Shan Levans, PCCM  Other Consultants:  Occupational therapy: Home OT.   physical therapy: Home health PT with 24-hour supervision recommended.  Anti-infectives:  Cefepime 03/26/2013 --->  Vancomycin 03/26/2013 --->  HPI/Subjective: Chelsea Lynch is confused, restless, and disoriented. She is diaphoretic. He deteriorated sometime last evening prompting a rapid response call. No fevers. Mrs. Speights says she feels awful but cannot specify what specifically is wrong.  Objective: Filed Vitals:   03/28/13 0900 03/28/13 1351 03/28/13 2015 03/29/13 0536  BP:  154/81 155/93 143/83  Pulse: 85 84 116 86  Temp:  98.5 F (36.9 C) 98.5 F (36.9 C) 97.6 F (36.4 C)  TempSrc:  Oral Oral Oral  Resp:  18 20 20   Height:      Weight:    65.2 kg (143 lb 11.8 oz)  SpO2: 100% 100% 100% 99%    Intake/Output Summary (Last 24 hours) at 03/29/13 1052 Last data filed at 03/29/13 0800  Gross per 24 hour  Intake    360 ml  Output      0 ml  Net    360 ml    Exam Gen:  Restless Cardiovascular:  RRR, No M/R/G Respiratory:  Lungs CTAB Gastrointestinal:  Abdomen soft, NT/ND, + BS Extremities:  No C/E/C  Data Reviewed: Basic Metabolic Panel:  Recent Labs Lab 03/25/13 2034 03/25/13 2055 03/26/13 0240 03/26/13 0430 03/27/13 0400 03/28/13 0500 03/29/13 0515  NA 132* 136  --  136 133* 136 139  K 3.5 3.5  --  4.3 3.7 3.4* 3.0*  CL 98 103  --  105 104 105 103  CO2 23  --   --  22 22 23  26  GLUCOSE 101* 103*  --  136* 109* 110* 111*  BUN 30* 30*  --  29* 24* 15 12  CREATININE 1.81* 1.80* 1.73* 1.70* 1.30* 1.09 0.88  CALCIUM 8.7  --   --  7.9* 8.6 8.7 9.3  MG  --   --   --  1.5  --   --   --   PHOS  --   --   --  3.7  --   --   --    GFR Estimated  Creatinine Clearance: 39.6 ml/min (by C-G formula based on Cr of 0.88). Liver Function Tests:  Recent Labs Lab 03/25/13 2034 03/27/13 0400  AST 16 32  ALT 10 38*  ALKPHOS 87 71  BILITOT 0.7 0.2*  PROT 6.6 5.5*  ALBUMIN 3.1* 2.4*   Coagulation profile  Recent Labs Lab 03/26/13 0240  INR 1.21    CBC:  Recent Labs Lab 03/25/13 2034  03/26/13 0240 03/26/13 0430 03/27/13 0400 03/28/13 0500 03/29/13 0515  WBC 10.9*  --  18.6* 16.3* 11.1* 9.0 10.7*  NEUTROABS 9.6*  --   --   --  8.9*  --   --   HGB 11.9*  < > 9.7* 10.6* 9.2* 9.8* 11.3*  HCT 33.8*  < > 28.4* 30.6* 26.0* 27.9* 31.7*  MCV 87.1  --  88.2 88.7 86.4 85.3 83.9  PLT 151  --  151 164 128* 152 204  < > = values in this interval not displayed. Cardiac Enzymes:  Recent Labs Lab 03/26/13 0430 03/26/13 1036 03/26/13 1636  TROPONINI 0.67* 0.65* 0.41*   BNP (last 3 results)  Recent Labs  03/25/13 2035  PROBNP 7189.0*   CBG:  Recent Labs Lab 03/26/13 0315 03/28/13 2018  GLUCAP 129* 111*   Microbiology Recent Results (from the past 240 hour(s))  CULTURE, BLOOD (ROUTINE X 2)     Status: None   Collection Time    03/25/13  8:30 PM      Result Value Range Status   Specimen Description BLOOD RIGHT ARM   Final   Special Requests BOTTLES DRAWN AEROBIC ONLY 10CC   Final   Culture  Setup Time     Final   Value: 03/26/2013 03:10     Two isolates with different morphologies were identified as the same organism.The most resistant organism was reported.   Culture     Final   Value: ESCHERICHIA COLI     Note: Gram Stain Report Called to,Read Back By and Verified With: TERESA CRITE 03/26/13 @ 6PM BY RUSCA.   Report Status 03/29/2013 FINAL   Final   Organism ID, Bacteria ESCHERICHIA COLI   Final  CULTURE, BLOOD (ROUTINE X 2)     Status: None   Collection Time    03/25/13  8:40 PM      Result Value Range Status   Specimen Description BLOOD RIGHT HAND   Final   Special Requests BOTTLES DRAWN AEROBIC AND  ANAEROBIC 10CC EA   Final   Culture  Setup Time 03/26/2013 03:10   Final   Culture     Final   Value:        BLOOD CULTURE RECEIVED NO GROWTH TO DATE CULTURE WILL BE HELD FOR 5 DAYS BEFORE ISSUING A FINAL NEGATIVE REPORT   Report Status PENDING   Incomplete  URINE CULTURE     Status: None   Collection Time    03/25/13 10:54 PM      Result Value Range Status  Specimen Description URINE, CATHETERIZED   Final   Special Requests ADDED 2313   Final   Culture  Setup Time 03/26/2013 13:21   Final   Colony Count 50,000 COLONIES/ML   Final   Culture ESCHERICHIA COLI   Final   Report Status 03/28/2013 FINAL   Final   Organism ID, Bacteria ESCHERICHIA COLI   Final  MRSA PCR SCREENING     Status: None   Collection Time    03/26/13  3:17 AM      Result Value Range Status   MRSA by PCR NEGATIVE  NEGATIVE Final   Comment:            The GeneXpert MRSA Assay (FDA     approved for NASAL specimens     only), is one component of a     comprehensive MRSA colonization     surveillance program. It is not     intended to diagnose MRSA     infection nor to guide or     monitor treatment for     MRSA infections.     Procedures and Diagnostic Studies: Dg Chest Port 1 View  03/27/2013  *RADIOLOGY REPORT*  Clinical Data: Shortness of breath.  PORTABLE CHEST - 1 VIEW  Comparison: Chest 03/25/2013 and 03/26/2013.  Findings: There has been increase in bibasilar atelectasis. Cardiomegaly and mild vascular congestion noted.  Dilated aortic arch is unchanged.  No pneumothorax or pleural effusion. Spinal stimulator device in right IJ catheter again noted.  IMPRESSION: Increased bibasilar atelectasis.   Original Report Authenticated By: Holley Dexter, M.D.    Dg Chest Portable 1 View  03/26/2013  *RADIOLOGY REPORT*  Clinical Data: Central line placement.  PORTABLE CHEST - 1 VIEW  Comparison: Chest radiograph performed 03/25/2013, and CT myelogram performed 12/31/2010  Findings: The patient's right IJ line is  seen ending about the mid to distal SVC.  Lung expansion is slightly improved.  Vascular congestion is noted. Mild bibasilar opacities likely reflect atelectasis; left basilar airspace opacity has improved.  No definite pleural effusion or pneumothorax is seen.  The cardiomediastinal silhouette is normal in size; calcification is noted within the aortic arch.  Given the pattern of calcification, there is suspicion for mild dilatation of the aortic arch.  No acute osseous abnormalities are seen.  The patient is status post right-sided rotator cuff repair.  Clips are noted within the right upper quadrant, reflecting prior cholecystectomy.  IMPRESSION:  1.  Right IJ line ends about the mid to distal SVC. 2.  Lung expansion has slightly improved; vascular congestion noted.  Mild bibasilar opacities likely reflect atelectasis; left basilar airspace opacity has improved. 3.  Question of mild dilatation of the aortic arch; this appears slightly more prominent than in 2012, when the aortic arch measured 4.0 cm in diameter on CT.   Original Report Authenticated By: Tonia Ghent, M.D.    Dg Chest Port 1 View  03/25/2013  *RADIOLOGY REPORT*  Clinical Data: 79 year old female altered mental status shortness of breath.  PORTABLE CHEST - 1 VIEW  Comparison: Thoracic spine CT 12/31/2010.  CT abdomen 01/17/2010.  Findings: Portable semi upright AP view at 2039 hours.  Stable to mildly lower lung volumes.  Mild left basilar opacity.  Stable cardiac size and mediastinal contours.  Chronic thoracic spinal stimulator lead appears stable.  Stable right upper quadrant surgical clips. Visualized tracheal air column is within normal limits.  Chronic deficiency of the distal right clavicle. No pneumothorax.  No pleural effusion identified.  IMPRESSION: Mildly lower lung volumes.  Patchy left lung base opacity, favor atelectasis but left lung base pneumonia not excluded.   Original Report Authenticated By: Erskine Speed, M.D.      Scheduled Meds: . aspirin  81 mg Oral Daily  . ceFEPime (MAXIPIME) IV  1 g Intravenous Q24H  . heparin  5,000 Units Subcutaneous Q8H  . levothyroxine  75 mcg Oral QAC breakfast  . metoprolol succinate  12.5 mg Oral Daily  . OxyCODONE  10 mg Oral Q12H  . pantoprazole  40 mg Oral Q1200  . potassium chloride  20 mEq Oral BID  . potassium chloride  40 mEq Oral Once  . senna-docusate  1 tablet Oral BID  . traZODone  100 mg Oral QHS   Continuous Infusions:   Time spent: 35 MINUTES    LOS: 4 days   RAMA,CHRISTINA  [facility] Pager [phone].  If 8PM-8AM, please contact night-coverage at [email or web address], password [facility] 03/29/2013, 10:52 AM

## 2013-03-29 NOTE — Progress Notes (Signed)
Pt. Seen and examined. Agree with the NP/PA-C note as written.  Increase b-blocker today for hypertension and tachycardia. Replete potassium. No further PSVT.  Will sign-off. Will see her in follow-up and consider outpatient myoview. Call with questions.    Chrystie Nose, MD, [facility] Attending Cardiologist [facility]

## 2013-03-29 NOTE — Progress Notes (Signed)
OT Cancellation Note  Patient Details Name: Chelsea Lynch MRN: [id number] DOB: March 16, 1925   Cancelled Treatment:    Reason Eval/Treat Not Completed: Fatigue/lethargy limiting ability to participate;Patient's level of consciousness;Other (comment) (per family pt having severe withdrawal symptoms. just fell asleep)  [facility], OTR/L  [phone] 03/29/2013 03/29/2013, 3:17 PM

## 2013-03-29 NOTE — Progress Notes (Addendum)
ANTIBIOTIC CONSULT NOTE - Follow-up  Pharmacy Consult for Cefepime Indication: R/o UTI, severe sepsis  Allergies  Allergen Reactions  . Clarithromycin Other (See Comments)    Unknown   . Codeine Other (See Comments)    Unknown   . Penicillins Other (See Comments)    Unknown   . Tequin (Gatifloxacin) Other (See Comments)    Unknown     Patient Measurements: Height: 5\' 4"  (162.6 cm) Weight: 143 lb 11.8 oz (65.2 kg) IBW/kg (Calculated) : 54.7  Vital Signs: Temp: 97.6 F (36.4 C) (04/23 0536) Temp src: Oral (04/23 0536) BP: 143/83 mmHg (04/23 0536) Pulse Rate: 86 (04/23 0536) Intake/Output from previous day: 04/22 0701 - 04/23 0700 In: 240 [P.O.:240] Out: -  Intake/Output from this shift:    Labs:  Recent Labs  03/27/13 0400 03/28/13 0500 03/29/13 0515  WBC 11.1* 9.0 10.7*  HGB 9.2* 9.8* 11.3*  PLT 128* 152 204  CREATININE 1.30* 1.09 0.88   Estimated Creatinine Clearance: 39.6 ml/min (by C-G formula based on Cr of 0.88). No results found for this basename: VANCOTROUGH, VANCOPEAK, VANCORANDOM, GENTTROUGH, GENTPEAK, GENTRANDOM, TOBRATROUGH, TOBRAPEAK, TOBRARND, AMIKACINPEAK, AMIKACINTROU, AMIKACIN,  in the last 72 hours   Microbiology: Recent Results (from the past 720 hour(s))  CULTURE, BLOOD (ROUTINE X 2)     Status: None   Collection Time    03/25/13  8:30 PM      Result Value Range Status   Specimen Description BLOOD RIGHT ARM   Final   Special Requests BOTTLES DRAWN AEROBIC ONLY 10CC   Final   Culture  Setup Time     Final   Value: 03/26/2013 03:10     Two isolates with different morphologies were identified as the same organism.The most resistant organism was reported.   Culture     Final   Value: ESCHERICHIA COLI     Note: Gram Stain Report Called to,Read Back By and Verified With: TERESA CRITE 03/26/13 @ 6PM BY RUSCA.   Report Status 03/29/2013 FINAL   Final   Organism ID, Bacteria ESCHERICHIA COLI   Final  CULTURE, BLOOD (ROUTINE X 2)      Status: None   Collection Time    03/25/13  8:40 PM      Result Value Range Status   Specimen Description BLOOD RIGHT HAND   Final   Special Requests BOTTLES DRAWN AEROBIC AND ANAEROBIC 10CC EA   Final   Culture  Setup Time 03/26/2013 03:10   Final   Culture     Final   Value:        BLOOD CULTURE RECEIVED NO GROWTH TO DATE CULTURE WILL BE HELD FOR 5 DAYS BEFORE ISSUING A FINAL NEGATIVE REPORT   Report Status PENDING   Incomplete  URINE CULTURE     Status: None   Collection Time    03/25/13 10:54 PM      Result Value Range Status   Specimen Description URINE, CATHETERIZED   Final   Special Requests ADDED 2313   Final   Culture  Setup Time 03/26/2013 13:21   Final   Colony Count 50,000 COLONIES/ML   Final   Culture ESCHERICHIA COLI   Final   Report Status 03/28/2013 FINAL   Final   Organism ID, Bacteria ESCHERICHIA COLI   Final  MRSA PCR SCREENING     Status: None   Collection Time    03/26/13  3:17 AM      Result Value Range Status   MRSA by PCR NEGATIVE  NEGATIVE Final   Comment:            The GeneXpert MRSA Assay (FDA     approved for NASAL specimens     only), is one component of a     comprehensive MRSA colonization     surveillance program. It is not     intended to diagnose MRSA     infection nor to guide or     monitor treatment for     MRSA infections.   Assessment: Chelsea Lynch presented with altered mental status and was started on vancomycin + cefepime. Vancomycin has been discontinued based on cultures. Renal function has been improving as well.   Vanc 4/20>>4/22 Cefepime 4/20>>  4/19 blood x2 - GNR 1/2 4/19 urine - Ecoli 4/20 MRSA - NEG  Plan:  1. Continue Cefepime 1gm IV Q24H 2. F/u renal fxn, C&S, clinical status   Chelsea Lynch, Chelsea Lynch 03/29/2013,8:18 AM   Addendum: Blood culture returned growing 50,000 of Ecoli with the same sensitivity pattern.   Plan: 1. MD, consider de-escalating antibiotic therapy based on cultures  Lysle Pearl,  PharmD, BCPS Pager # [phone] 03/29/2013 9:31 AM

## 2013-03-29 NOTE — Progress Notes (Signed)
Subjective:  Awake, alert, confused last night. RN and primary believe she may be in narcotic withdrawal.  Objective:  Vital Signs in the last 24 hours: Temp:  [97.6 F (36.4 C)-98.5 F (36.9 C)] 97.6 F (36.4 C) (04/23 0536) Pulse Rate:  [84-116] 86 (04/23 0536) Resp:  [18-20] 20 (04/23 0536) BP: (143-155)/(81-93) 143/83 mmHg (04/23 0536) SpO2:  [99 %-100 %] 99 % (04/23 0536) Weight:  [65.2 kg (143 lb 11.8 oz)] 65.2 kg (143 lb 11.8 oz) (04/23 0536)  Intake/Output from previous day:  Intake/Output Summary (Last 24 hours) at 03/29/13 1045 Last data filed at 03/29/13 0800  Gross per 24 hour  Intake    360 ml  Output      0 ml  Net    360 ml    Physical Exam: General appearance: alert, cooperative, mild distress and chronically ill, in fetal position when I saw her this am. Lungs: decreased breath sounds Heart: regular rate and rhythm   Rate: 86  Rhythm: normal sinus rhythm and !st degree AVB, PVCs, coupllets  Lab Results:  Recent Labs  03/28/13 0500 03/29/13 0515  WBC 9.0 10.7*  HGB 9.8* 11.3*  PLT 152 204    Recent Labs  03/28/13 0500 03/29/13 0515  NA 136 139  K 3.4* 3.0*  CL 105 103  CO2 23 26  GLUCOSE 110* 111*  BUN 15 12  CREATININE 1.09 0.88    Recent Labs  03/26/13 1636  TROPONINI 0.41*   Hepatic Function Panel  Recent Labs  03/27/13 0400  PROT 5.5*  ALBUMIN 2.4*  AST 32  ALT 38*  ALKPHOS 71  BILITOT 0.2*   No results found for this basename: CHOL,  in the last 72 hours No results found for this basename: INR,  in the last 72 hours  Imaging: Imaging results have been reviewed  Cardiac Studies:  2D - Left ventricle: The cavity size was at the lower limits of normal. There was focal basal hypertrophy of the septum. Systolic function was normal. The estimated ejection fraction was in the range of 60% to 65%. Although no diagnostic regional wall motion abnormality was identified, this possibility cannot be completely excluded on  the basis of this study. Left ventricular diastolic function parameters were normal for the patient's age. - Aortic valve: Mildly calcified annulus. Trileaflet; mildly thickened, mildly calcified leaflets. Mild to moderate regurgitation. - Mitral valve: Mildly calcified annulus. - Right ventricle: The cavity size was mildly to moderately dilated. Wall thickness was normal. - Right atrium: The atrium was moderately dilated. - Tricuspid valve: Visually Mild-moderate regurgitation. - Pulmonary arteries: PA peak pressure: 47mm Hg (S).    Assessment/Plan:   Principal Problem:   Septic shock(785.52)  Active Problems:   Aspiration pneumonia   Altered mental status- suspected to be secondary to sepsis and narcotic withdrawl   PSVT (supraventricular tachycardia)   BACK PAIN, CHRONIC- on chronic narcotics at home   Urosepsis   Bacteremia due to Gram-negative bacteria   Acute on chronic renal insufficiency- resolved   Unspecified hypothyroidism   Normocytic anemia   GERD (gastroesophageal reflux disease)   Infiltrate noted on imaging study   Elevated troponin- felt to be secondary to SVT  Plan- Toprol resumed at low dose. Oxycontin resumed. Keep K+ close to 4.0    [employer] PA-C 03/29/2013, 10:45 AM

## 2013-03-29 NOTE — Clinical Documentation Improvement (Signed)
CHANGE MENTAL STATUS DOCUMENTATION CLARIFICATION   THIS DOCUMENT IS NOT A PERMANENT PART OF THE MEDICAL RECORD  TO RESPOND TO THE THIS QUERY, FOLLOW THE INSTRUCTIONS BELOW:  1. If needed, update documentation for the patient's encounter via the notes activity.  2. Access this query again and click edit on the In [employer].  3. After updating, or not, click F2 to complete all highlighted (required) fields concerning your review. Select "additional documentation in the medical record" OR "no additional documentation provided".  4. Click Sign note button.  5. The deficiency will fall out of your In Basket *Please let us know if you are not able to complete this workflow by phone or e-mail (listed below).         03/29/13  Dear Dr.  Darnelle Catalan,   In an effort to better capture your patient's severity of illness, reflect appropriate length of stay and utilization of resources, a review of the patient medical record has revealed the following indicators.    Possible Clinical Conditions?  - Encephalopathy (describe type if known)                       Septic                       Metabolic                       Toxic - Acute delirium - Hypoxemia / Hypoxia - Other Condition  Supporting Information: - Risk Factors: GNR Severe Sepsis, UTI, Septic Shock, NSTEMI 2/2 demand ischemia - Signs & Symptoms: AMS, confusion - Treatment: IV Maxipime, NS IV 10-20 ml/hr, Ativan IV x1, Trazodone, O2 continuous  Reviewed: additional documentation in the medical record  Thank You,  Beverley Fiedler RN BSN Clinical Documentation Specialist: Tele Contact:  [phone]  Health Information Management [place]

## 2013-03-30 ENCOUNTER — Inpatient Hospital Stay (HOSPITAL_COMMUNITY): Payer: Medicare Other

## 2013-03-30 LAB — CBC
HCT: 30.4 % — ABNORMAL LOW (ref 36.0–46.0)
Hemoglobin: 10.9 g/dL — ABNORMAL LOW (ref 12.0–15.0)
MCH: 30 pg (ref 26.0–34.0)
MCHC: 35.9 g/dL (ref 30.0–36.0)
RBC: 3.63 MIL/uL — ABNORMAL LOW (ref 3.87–5.11)

## 2013-03-30 LAB — BASIC METABOLIC PANEL
BUN: 13 mg/dL (ref 6–23)
CO2: 24 mEq/L (ref 19–32)
Calcium: 9.2 mg/dL (ref 8.4–10.5)
GFR calc non Af Amer: 48 mL/min — ABNORMAL LOW (ref >90)
Glucose, Bld: 94 mg/dL (ref 70–99)
Potassium: 4.1 mEq/L (ref 3.5–5.1)

## 2013-03-30 NOTE — Progress Notes (Signed)
Occupational Therapy Treatment Patient Details Name: Chelsea Lynch MRN: [id number] DOB: Jul 02, 1925 Today's Date: 03/30/2013 Time: 7829-5621 OT Time Calculation (min): 38 min  OT Assessment / Plan / Recommendation Comments on Treatment Session Pt requiring more assistance today with mobility and ADL. Pt is not oriented to place, time or situation and demonstrates mod to sever cognitive deficits at this time. Pt is not safe to D/C home unless she has 24/7 assistance. discussed with pt's husband,who states he could not handle pt at this level. Family states pt is not at he baseline. If pt is to D/C home, she will need 24/7 physical assistance. If family is not able to provide this level of care and pt does not begin to clear cognitively, pt may benefit from SNF.    Follow Up Recommendations  SNF;Supervision/Assistance - 24 hour;Home health OT    Barriers to Discharge       Equipment Recommendations  None recommended by OT    Recommendations for Other Services    Frequency Min 2X/week   Plan Discharge plan needs to be updated    Precautions / Restrictions Precautions Precautions: Fall Precaution Comments: confused Restrictions Weight Bearing Restrictions: No   Pertinent Vitals/Pain No c/o pain    ADL  Grooming: Moderate assistance (due to confusion) Toilet Transfer: Moderate assistance Toilet Transfer Method: Stand pivot;Other (comment) (ambulating) Toilet Transfer Equipment: Bedside commode Equipment Used: Gait belt Transfers/Ambulation Related to ADLs: mod A at times with ambulation. Poor sense of midline. Poor balance. High fall risk. confused    OT Diagnosis:    OT Problem List:   OT Treatment Interventions:     OT Goals Acute Rehab OT Goals OT Goal Formulation: With patient/family Time For Goal Achievement: 04/04/13 Potential to Achieve Goals: Good ADL Goals Pt Will Perform Grooming: with supervision;Supported;Standing at sink ADL Goal: Grooming - Progress: Revised  due to lack of progress Pt Will Perform Lower Body Bathing: with supervision;Sitting, chair;Sitting in shower ADL Goal: Lower Body Bathing - Progress: Progressing toward goals Pt Will Perform Lower Body Dressing: with supervision;Sit to stand from chair;Other (comment) Pt Will Perform Tub/Shower Transfer: with supervision;Shower transfer;Shower seat with back Additional ADL Goal #1: Pt will complete all toileting with S on high commode. ADL Goal: Additional Goal #1 - Progress: Not progressing  Visit Information  Last OT Received On: 03/30/13 Assistance Needed: +1    Subjective Data      Prior Functioning    S   Cognition  Cognition Arousal/Alertness: Lethargic Behavior During Therapy: Flat affect Overall Cognitive Status: Impaired/Different from baseline Area of Impairment: Orientation;Attention;Memory;Following commands;Safety/judgement;Awareness;Problem solving Orientation Level: Disoriented to;Place;Time;Situation Current Attention Level: Focused Memory: Decreased recall of precautions Following Commands: Follows one step commands inconsistently Safety/Judgement: Decreased awareness of safety;Decreased awareness of deficits Awareness: Intellectual Problem Solving: Slow processing;Difficulty sequencing;Requires verbal cues General Comments: PT confused this am. unsafe with ambulation. high fall risk    Mobility  Bed Mobility Bed Mobility: Not assessed Transfers Transfers: Sit to Stand;Stand to Sit Sit to Stand: 4: Min assist;With upper extremity assist;From chair/3-in-1 Stand to Sit: 4: Min assist;With upper extremity assist;To chair/3-in-1 Details for Transfer Assistance: tactile cues to place hands on arms of toilet/chair    Exercises      Balance Balance Balance Assessed: Yes Static Standing Balance Static Standing - Balance Support: Bilateral upper extremity supported Static Standing - Level of Assistance: 4: Min assist Static Standing - Comment/# of Minutes:  10 mins at sink Dynamic Standing Balance Dynamic Standing - Balance Support: Bilateral upper  extremity supported Dynamic Standing - Level of Assistance: 3: Mod assist High Level Balance High Level Balance Activites: Direction changes;Head turns   End of Session OT - End of Session Equipment Utilized During Treatment: Gait belt Activity Tolerance: Patient limited by fatigue;Other (comment) (confusion) Patient left: in chair;with call bell/phone within reach;with family/visitor present Nurse Communication: Mobility status;Other (comment)  GO     Tyrail Grandfield,HILLARY 03/30/2013, 3:45 PM [facility], OTR/L  [phone] 03/30/2013

## 2013-03-30 NOTE — Progress Notes (Signed)
Physical Therapy Treatment Patient Details Name: Chelsea Lynch MRN: [id number] DOB: 28-Mar-1925 Today's Date: 03/30/2013 Time: 2952-8413 PT Time Calculation (min): 36 min  PT Assessment / Plan / Recommendation Comments on Treatment Session   Pt's daughter reports pt is experiencing pain medication withdrawals that began Tuesday night.  Pt with increased confusion & limited mobility today.       Follow Up Recommendations  Home health PT;Supervision/Assistance - 24 hour     Does the patient have the potential to tolerate intense rehabilitation     Barriers to Discharge        Equipment Recommendations  Other (comment) (4 wheeled RW)    Recommendations for Other Services    Frequency Min 3X/week   Plan Discharge plan remains appropriate    Precautions / Restrictions Precautions Precautions: Fall Restrictions Weight Bearing Restrictions: No       Mobility  Bed Mobility Bed Mobility: Not assessed Transfers Transfers: Sit to Stand;Stand to Sit Sit to Stand: 4: Min assist;With upper extremity assist;With armrests;From chair/3-in-1 Stand to Sit: 4: Min assist;With upper extremity assist;With armrests;To chair/3-in-1 Details for Transfer Assistance: (A) for balance & safety.  Cues for initiation & technique.   Ambulation/Gait Ambulation/Gait Assistance: 4: Min assist Ambulation Distance (Feet): 10 Feet Assistive device: 1 person hand held assist Ambulation/Gait Assistance Details: (A) provided via Rt [facility].   Gait Pattern: Shuffle Stairs: No Wheelchair Mobility Wheelchair Mobility: No      PT Goals Acute Rehab PT Goals Time For Goal Achievement: 04/03/13 Potential to Achieve Goals: Good Pt will go Supine/Side to Sit: with supervision;with HOB 0 degrees Pt will go Sit to Supine/Side: with supervision;with HOB 0 degrees Pt will go Sit to Stand: with supervision;with cues (comment type and amount) PT Goal: Sit to Stand - Progress: Not met Pt will go Stand to Sit: with  supervision;with cues (comment type and amount) PT Goal: Stand to Sit - Progress: Not met Pt will Ambulate: 16 - 50 feet;with supervision;with least restrictive assistive device PT Goal: Ambulate - Progress: Not met  Visit Information  Last PT Received On: 03/30/13 Assistance Needed: +1    Subjective Data      Cognition  Cognition Arousal/Alertness: Awake/alert Behavior During Therapy: Flat affect Overall Cognitive Status: History of cognitive impairments - at baseline General Comments: Pt presented much differently today than she did last PT session.  Pt's daughter reports pt is going through pain medication withdrawals.   Increased confusion, flat affect, disoriented noted today.  Daughter reports change began to happen Tuesday night.     Balance  Static Standing Balance Static Standing - Balance Support: Right upper extremity supported;Left upper extremity supported Static Standing - Level of Assistance: 5: Stand by assistance Static Standing - Comment/# of Minutes: 10 mins at sink  End of Session PT - End of Session Equipment Utilized During Treatment: Gait belt Activity Tolerance: Patient limited by fatigue Patient left: in chair;with call bell/phone within reach;with family/visitor present Nurse Communication: Mobility status     Chelsea Lynch, [place] [phone] 03/30/2013

## 2013-03-30 NOTE — Progress Notes (Addendum)
[facility] PROGRESS NOTE  Chelsea Lynch ZOX:[id number] DOB: 1925/09/04 DOA: 03/25/2013 PCP: [name], MD  Brief narrative: Chelsea Lynch is an 79 y.o. female with a past medical history of hypothyroidism, depression, anxiety, and chronic pain who was admitted on 03/26/2013 with altered mental status, hypotension, and septic shock from a urinary source.  Assessment/Plan: Principal Problem:   Septic shock(785.52) / urosepsis / bacteremia due to gram-negative bacteria -Source most likely from UTI given gram-negative rod bacteremia and Escherichia coli positive urine culture. -14 day course of antibiotics recommended, will choose oral agent once final cultures back. -Continue empiric cefepime, now off vancomycin. -Pro calcitonin level equivocal at 1.96. -Repeat blood cultures to document clearance of Escherichia coli. Active Problems:   Hypokalemia -Supplement.   Septic encephalopathy, delirium, altered mental status, possible opiate withdrawal -Deterioration of mental status noted overnight. Question narcotics withdrawal. -Underlying encephalopathy from sepsis and delirium.  -Continue OxyContin 10 mg Q 12 hours, and provide supportive care.  Treat underlying conditions.   Hypothyroidism -TSH within normal limits. Continue Synthroid.   Normocytic anemia -Hemoglobin stable with no indication for transfusion.   Gastroesophageal reflux disease -Continue Protonix.   BACK PAIN, CHRONIC -Supportive care.   Acute renal failure -Likely prerenal, creatinine improved from 1.81. Good urine output. -Resolved.   Left lower lobe infiltrate rule out aspiration pneumonia -Atelectasis versus pneumonia. On cefepime. Repeat chest x-ray. May need to broaden antibiotics back given clinical deterioration. -Sputum culture pending.   Elevated troponin / SVT -Likely from demand ischemia in the setting of sepsis. -Likely from sepsis. Cortisol 18.1, TSH 0.532, proBNP 7198. -Cardiologist  is following with plans to increase beta blocker dose to home dose. -Outpatient myocardial perfusion study recommended.   Code Status: Full. Family Communication: Daughter updated at bedside. Disposition Plan: Home soon.   Medical Consultants:  Dr. Thurmon Fair, Cardiology  Dr. Shan Levans, PCCM  Other Consultants:  Occupational therapy: Home OT.   physical therapy: Home health PT with 24-hour supervision recommended.  Anti-infectives:  Cefepime 03/26/2013 --->  Vancomycin 03/26/2013 --->  HPI/Subjective: Chelsea Lynch is less confused, restless, and disoriented today, but still not back to baseline. No fevers. Poor concentration. Sitting up in a chair.  Objective: Filed Vitals:   03/29/13 1413 03/29/13 1424 03/29/13 2108 03/30/13 0451  BP:  135/80 138/104 150/94  Pulse: 95 97 87 95  Temp:  98.8 F (37.1 C) 97.6 F (36.4 C) 97.8 F (36.6 C)  TempSrc: Oral Oral Oral Oral  Resp: 22 20 20 20   Height:      Weight:    59.2 kg (130 lb 8.2 oz)  SpO2: 99% 99% 100% 97%    Intake/Output Summary (Last 24 hours) at 03/30/13 1102 Last data filed at 03/29/13 1800  Gross per 24 hour  Intake    360 ml  Output      0 ml  Net    360 ml    Exam Gen:  Restless Cardiovascular:  RRR, No M/R/G Respiratory:  Lungs diminished but relatively clear Gastrointestinal:  Abdomen soft, NT/ND, + BS Extremities:  No C/E/C  Data Reviewed: Basic Metabolic Panel:  Recent Labs Lab 03/25/13 2055  03/26/13 0430 03/27/13 0400 03/28/13 0500 03/29/13 0515 03/30/13 0336  NA 136  --  136 133* 136 139 138  K 3.5  --  4.3 3.7 3.4* 3.0* 4.1  CL 103  --  105 104 105 103 104  CO2  --   --  22 22 23 26 24   GLUCOSE  103*  --  136* 109* 110* 111* 94  BUN 30*  --  29* 24* 15 12 13   CREATININE 1.80*  < > 1.70* 1.30* 1.09 0.88 1.02  CALCIUM  --   --  7.9* 8.6 8.7 9.3 9.2  MG  --   --  1.5  --   --   --   --   PHOS  --   --  3.7  --   --   --   --   < > = values in this interval not  displayed. GFR Estimated Creatinine Clearance: 34.2 ml/min (by C-G formula based on Cr of 1.02). Liver Function Tests:  Recent Labs Lab 03/25/13 2034 03/27/13 0400  AST 16 32  ALT 10 38*  ALKPHOS 87 71  BILITOT 0.7 0.2*  PROT 6.6 5.5*  ALBUMIN 3.1* 2.4*   Coagulation profile  Recent Labs Lab 03/26/13 0240  INR 1.21    CBC:  Recent Labs Lab 03/25/13 2034  03/26/13 0430 03/27/13 0400 03/28/13 0500 03/29/13 0515 03/30/13 0336  WBC 10.9*  < > 16.3* 11.1* 9.0 10.7* 11.3*  NEUTROABS 9.6*  --   --  8.9*  --   --   --   HGB 11.9*  < > 10.6* 9.2* 9.8* 11.3* 10.9*  HCT 33.8*  < > 30.6* 26.0* 27.9* 31.7* 30.4*  MCV 87.1  < > 88.7 86.4 85.3 83.9 83.7  PLT 151  < > 164 128* 152 204 215  < > = values in this interval not displayed. Cardiac Enzymes:  Recent Labs Lab 03/26/13 0430 03/26/13 1036 03/26/13 1636  TROPONINI 0.67* 0.65* 0.41*   BNP (last 3 results)  Recent Labs  03/25/13 2035  PROBNP 7189.0*   CBG:  Recent Labs Lab 03/26/13 0315 03/28/13 2018  GLUCAP 129* 111*   Microbiology Recent Results (from the past 240 hour(s))  CULTURE, BLOOD (ROUTINE X 2)     Status: None   Collection Time    03/25/13  8:30 PM      Result Value Range Status   Specimen Description BLOOD RIGHT ARM   Final   Special Requests BOTTLES DRAWN AEROBIC ONLY 10CC   Final   Culture  Setup Time     Final   Value: 03/26/2013 03:10     Two isolates with different morphologies were identified as the same organism.The most resistant organism was reported.   Culture     Final   Value: ESCHERICHIA COLI     Note: Gram Stain Report Called to,Read Back By and Verified With: TERESA CRITE 03/26/13 @ 6PM BY RUSCA.   Report Status 03/29/2013 FINAL   Final   Organism ID, Bacteria ESCHERICHIA COLI   Final  CULTURE, BLOOD (ROUTINE X 2)     Status: None   Collection Time    03/25/13  8:40 PM      Result Value Range Status   Specimen Description BLOOD RIGHT HAND   Final   Special Requests  BOTTLES DRAWN AEROBIC AND ANAEROBIC 10CC EA   Final   Culture  Setup Time 03/26/2013 03:10   Final   Culture     Final   Value:        BLOOD CULTURE RECEIVED NO GROWTH TO DATE CULTURE WILL BE HELD FOR 5 DAYS BEFORE ISSUING A FINAL NEGATIVE REPORT   Report Status PENDING   Incomplete  URINE CULTURE     Status: None   Collection Time    03/25/13 10:54 PM  Result Value Range Status   Specimen Description URINE, CATHETERIZED   Final   Special Requests ADDED 2313   Final   Culture  Setup Time 03/26/2013 13:21   Final   Colony Count 50,000 COLONIES/ML   Final   Culture ESCHERICHIA COLI   Final   Report Status 03/28/2013 FINAL   Final   Organism ID, Bacteria ESCHERICHIA COLI   Final  MRSA PCR SCREENING     Status: None   Collection Time    03/26/13  3:17 AM      Result Value Range Status   MRSA by PCR NEGATIVE  NEGATIVE Final   Comment:            The GeneXpert MRSA Assay (FDA     approved for NASAL specimens     only), is one component of a     comprehensive MRSA colonization     surveillance program. It is not     intended to diagnose MRSA     infection nor to guide or     monitor treatment for     MRSA infections.     Procedures and Diagnostic Studies: Dg Chest Port 1 View  03/27/2013  *RADIOLOGY REPORT*  Clinical Data: Shortness of breath.  PORTABLE CHEST - 1 VIEW  Comparison: Chest 03/25/2013 and 03/26/2013.  Findings: There has been increase in bibasilar atelectasis. Cardiomegaly and mild vascular congestion noted.  Dilated aortic arch is unchanged.  No pneumothorax or pleural effusion. Spinal stimulator device in right IJ catheter again noted.  IMPRESSION: Increased bibasilar atelectasis.   Original Report Authenticated By: Holley Dexter, M.D.    Dg Chest Portable 1 View  03/26/2013  *RADIOLOGY REPORT*  Clinical Data: Central line placement.  PORTABLE CHEST - 1 VIEW  Comparison: Chest radiograph performed 03/25/2013, and CT myelogram performed 12/31/2010  Findings: The  patient's right IJ line is seen ending about the mid to distal SVC.  Lung expansion is slightly improved.  Vascular congestion is noted. Mild bibasilar opacities likely reflect atelectasis; left basilar airspace opacity has improved.  No definite pleural effusion or pneumothorax is seen.  The cardiomediastinal silhouette is normal in size; calcification is noted within the aortic arch.  Given the pattern of calcification, there is suspicion for mild dilatation of the aortic arch.  No acute osseous abnormalities are seen.  The patient is status post right-sided rotator cuff repair.  Clips are noted within the right upper quadrant, reflecting prior cholecystectomy.  IMPRESSION:  1.  Right IJ line ends about the mid to distal SVC. 2.  Lung expansion has slightly improved; vascular congestion noted.  Mild bibasilar opacities likely reflect atelectasis; left basilar airspace opacity has improved. 3.  Question of mild dilatation of the aortic arch; this appears slightly more prominent than in 2012, when the aortic arch measured 4.0 cm in diameter on CT.   Original Report Authenticated By: Tonia Ghent, M.D.    Dg Chest Port 1 View  03/25/2013  *RADIOLOGY REPORT*  Clinical Data: 79 year old female altered mental status shortness of breath.  PORTABLE CHEST - 1 VIEW  Comparison: Thoracic spine CT 12/31/2010.  CT abdomen 01/17/2010.  Findings: Portable semi upright AP view at 2039 hours.  Stable to mildly lower lung volumes.  Mild left basilar opacity.  Stable cardiac size and mediastinal contours.  Chronic thoracic spinal stimulator lead appears stable.  Stable right upper quadrant surgical clips. Visualized tracheal air column is within normal limits.  Chronic deficiency of the distal right clavicle. No pneumothorax.  No pleural effusion identified.  IMPRESSION: Mildly lower lung volumes.  Patchy left lung base opacity, favor atelectasis but left lung base pneumonia not excluded.   Original Report Authenticated By: Erskine Speed, M.D.     Scheduled Meds: . aspirin  81 mg Oral Daily  . ceFEPime (MAXIPIME) IV  1 g Intravenous Q24H  . heparin  5,000 Units Subcutaneous Q8H  . levothyroxine  75 mcg Oral QAC breakfast  . metoprolol succinate  25 mg Oral Daily  . OxyCODONE  10 mg Oral Q12H  . pantoprazole (PROTONIX) IV  40 mg Intravenous Q2200  . senna-docusate  1 tablet Oral BID  . traZODone  100 mg Oral QHS   Continuous Infusions: . 0.9 % NaCl with KCl 40 mEq / L 100 mL/hr (03/29/13 1621)    Time spent: 35 MINUTES    LOS: 5 days   Evart Mcdonnell  [facility] Pager [phone].  If 8PM-8AM, please contact night-coverage at [email or web address], password [facility] 03/30/2013, 11:02 AM

## 2013-03-31 LAB — BASIC METABOLIC PANEL
BUN: 10 mg/dL (ref 6–23)
Calcium: 8.8 mg/dL (ref 8.4–10.5)
Creatinine, Ser: 0.96 mg/dL (ref 0.50–1.10)
GFR calc Af Amer: 60 mL/min — ABNORMAL LOW (ref >90)
GFR calc non Af Amer: 52 mL/min — ABNORMAL LOW (ref >90)
Glucose, Bld: 88 mg/dL (ref 70–99)
Potassium: 4.4 mEq/L (ref 3.5–5.1)

## 2013-03-31 LAB — CBC
HCT: 28.1 % — ABNORMAL LOW (ref 36.0–46.0)
Hemoglobin: 9.9 g/dL — ABNORMAL LOW (ref 12.0–15.0)
MCH: 29.8 pg (ref 26.0–34.0)
MCHC: 35.2 g/dL (ref 30.0–36.0)
RDW: 13.9 % (ref 11.5–15.5)

## 2013-03-31 MED ORDER — ONDANSETRON HCL 4 MG/2ML IJ SOLN: 4.0000 mg | Freq: Four times a day (QID) | INTRAMUSCULAR | Status: DC | PRN

## 2013-03-31 NOTE — Progress Notes (Signed)
[facility]  PROGRESS NOTE   Brief narrative:  Chelsea Lynch is an 79 y.o. female with a past medical history of hypothyroidism, depression, anxiety, and chronic pain who was admitted on 03/26/2013 with altered mental status, hypotension, and septic shock from a urinary source.  Assessment/Plan:  Principal Problem:  Septic shock(785.52) / urosepsis / bacteremia due to gram-negative bacteria  - Source most likely from UTI given gram-negative rod bacteremia and Escherichia coli positive urine culture.  - UC 4/19 - + e. Coli, sensitive to macrobid and cipro; BC 1/3 + for e. Coli on 4/19, otherwise BC are negative.  - 14 day course of antibiotics recommended, consider macrobid tomorrow if patient continues to improve.  - Cefepime, Vancomycin d/c'd - Patient improving, HD stable today - PT/OT if able tomorrow.   Active Problems:  Hypokalemia  -Supplement, improved  Septic encephalopathy, delirium, altered mental status, possible opiate withdrawal  - Likely 2/2 known infection and narcotics withdrawal, this is my first time seeing the patient, but she does seem improved.  - Underlying encephalopathy from sepsis and delirium.  - Continue OxyContin 10 mg Q 12 hours, and provide supportive care. Treat underlying conditions.  - If patient not continuing to improve tomorrow, consider non con CT head.   Hypothyroidism  - Continue Synthroid.   Normocytic anemia  -Hemoglobin stable    Gastroesophageal reflux disease  -Continue Protonix.   Acute renal failure  -Resolved.   Left lower lobe infiltrate rule out aspiration pneumonia  - Atelectasis versus pneumonia. On cefepime.  - F/U CT showed no acute infiltrate.  - Sputum culture not collected.   Elevated troponin / SVT  - Likely from demand ischemia in the setting of sepsis.  - Cardiologist is following with plans to increase beta blocker dose to home dose.  - Outpatient myocardial perfusion study recommended. They have signed  off.    Code Status: Full.  Family Communication: Updated daughter by phone.  She is very concerned re: possible stroke.  I discussed with her my thoughts re: her continued confusion/weakness.  Will consider CT head tomorrow if not improving.  Disposition Plan: Home soon.   Medical Consultants:  Dr. Thurmon Fair, Cardiology  Dr. Shan Levans, PCCM  Other Consultants:  Occupational therapy: Home OT.  physical therapy: Home health PT with 24-hour supervision recommended.  Anti-infectives:  Cefepime 03/26/2013 --->  Vancomycin 03/26/2013 ---> 4/24   Subjective: Chelsea Lynch is doing better this AM.  She has a sitter with her helping her eat lunch.  She is alert, recognized people appropriately.  She is tearful as she misses her husband.  She is moving all her extremities, responds to questions and follows directions  Objective: Vital signs in last 24 hours: Temp:  [97.9 F (36.6 C)-98.4 F (36.9 C)] 97.9 F (36.6 C) (04/25 1330) Pulse Rate:  [75-86] 75 (04/25 1330) Resp:  [17-20] 17 (04/25 1330) BP: (113-166)/(79-100) 166/100 mmHg (04/25 1330) SpO2:  [99 %-100 %] 99 % (04/25 1330) Weight:  [132 lb 7.9 oz (60.1 kg)] 132 lb 7.9 oz (60.1 kg) (04/25 0500) Weight change: 1 lb 15.7 oz (0.9 kg) Last BM Date: 03/30/13  Intake/Output from previous day: 04/24 0701 - 04/25 0700 In: 120 [P.O.:120] Out: -  Intake/Output this shift: Total I/O In: 240 [P.O.:240] Out: -   General appearance: alert, cooperative, appears stated age and no distress Head: Normocephalic, without obvious abnormality, atraumatic Eyes: EOMI, anicteric sclerae Back: symmetric, no curvature. ROM normal. No CVA tenderness., no wounds Resp: clear  to auscultation bilaterally and no wheezing Cardio: RR, NR, no murmur note GI: soft, NT, +BS Extremities: thin, + overlapping toes on the right foot Neuro: Alert, moves all extremities, follows commands  Lab Results:  Recent Labs  03/30/13 0336 03/31/13 0455   WBC 11.3* 10.5  HGB 10.9* 9.9*  HCT 30.4* 28.1*  PLT 215 188   BMET  Recent Labs  03/30/13 0336 03/31/13 0455  NA 138 136  K 4.1 4.4  CL 104 106  CO2 24 22  GLUCOSE 94 88  BUN 13 10  CREATININE 1.02 0.96  CALCIUM 9.2 8.8    Studies/Results: Dg Chest Port 1 View  03/30/2013  *RADIOLOGY REPORT*  Clinical Data: Recent blood infection question pneumonia  PORTABLE CHEST - 1 VIEW  Comparison: Portable exam 1135 hours compared to 03/27/2013  Findings: 5 joules of central venous catheter stable with tip projecting over SVC. Intraspinal stimulator leads unchanged. Normal heart size and pulmonary vascularity. Calcified aortic arch with question mild aneurysmal dilatation. Pulmonary vascularity normal. Minimal peribronchial thickening without infiltrate, pleural effusion or pneumothorax. Bones demineralized.  IMPRESSION: Mild bronchitic changes without acute infiltrate. Suspected aneurysmal dilatation of the aortic arch; follow-up CT chest recommended to assess aortic dimensions.   Original Report Authenticated By: Ulyses Southward, M.D.     Medications: I have reviewed the patient's current medications.   LOS: 6 days   Chelsea Lynch 03/31/2013, 4:53 PM

## 2013-04-01 ENCOUNTER — Inpatient Hospital Stay (HOSPITAL_COMMUNITY): Payer: Medicare Other

## 2013-04-01 LAB — CBC WITH DIFFERENTIAL/PLATELET
Basophils Absolute: 0.1 10*3/uL (ref 0.0–0.1)
Basophils Relative: 1 % (ref 0–1)
Eosinophils Absolute: 0.2 10*3/uL (ref 0.0–0.7)
Hemoglobin: 11.2 g/dL — ABNORMAL LOW (ref 12.0–15.0)
Lymphocytes Relative: 19 % (ref 12–46)
MCH: 29.8 pg (ref 26.0–34.0)
MCHC: 35.4 g/dL (ref 30.0–36.0)
Monocytes Absolute: 1 10*3/uL (ref 0.1–1.0)
Neutrophils Relative %: 69 % (ref 43–77)
Platelets: 244 10*3/uL (ref 150–400)
RDW: 14 % (ref 11.5–15.5)

## 2013-04-01 LAB — CBC
MCH: 29.5 pg (ref 26.0–34.0)
MCV: 83.2 fL (ref 78.0–100.0)
Platelets: 287 10*3/uL (ref 150–400)
RBC: 3.86 MIL/uL — ABNORMAL LOW (ref 3.87–5.11)
RDW: 14.3 % (ref 11.5–15.5)
WBC: 11.1 10*3/uL — ABNORMAL HIGH (ref 4.0–10.5)

## 2013-04-01 LAB — BASIC METABOLIC PANEL
BUN: 9 mg/dL (ref 6–23)
Calcium: 9.4 mg/dL (ref 8.4–10.5)
Calcium: 9.4 mg/dL (ref 8.4–10.5)
Chloride: 105 mEq/L (ref 96–112)
Creatinine, Ser: 1 mg/dL (ref 0.50–1.10)
Creatinine, Ser: 1.13 mg/dL — ABNORMAL HIGH (ref 0.50–1.10)
GFR calc Af Amer: 57 mL/min — ABNORMAL LOW (ref >90)
GFR calc Af Amer: 50 mL/min — ABNORMAL LOW (ref >90)
GFR calc non Af Amer: 50 mL/min — ABNORMAL LOW (ref >90)
Glucose, Bld: 91 mg/dL (ref 70–99)
Potassium: 4.9 mEq/L (ref 3.5–5.1)
Sodium: 135 mEq/L (ref 135–145)

## 2013-04-01 LAB — CULTURE, BLOOD (ROUTINE X 2)

## 2013-04-01 LAB — MAGNESIUM: Magnesium: 1.7 mg/dL (ref 1.5–2.5)

## 2013-04-01 LAB — TROPONIN I: Troponin I: <0.3 ng/mL (ref <0.30)

## 2013-04-01 MED ORDER — VENLAFAXINE HCL ER 75 MG PO CP24
75.0000 mg | ORAL_CAPSULE | Freq: Two times a day (BID) | ORAL | Status: DC
  Administered 2013-04-01 – 2013-04-06 (×10): 75 mg via ORAL
  Filled 2013-04-01 (×13): qty 1

## 2013-04-01 MED ORDER — SODIUM CHLORIDE 0.9 % IV SOLN
INTRAVENOUS | Status: DC
  Administered 2013-04-01: 18:00:00 via INTRAVENOUS
  Administered 2013-04-02 – 2013-04-03 (×2): 1000 mL via INTRAVENOUS
  Administered 2013-04-04: via INTRAVENOUS
  Administered 2013-04-04: 100 mL/h via INTRAVENOUS

## 2013-04-01 MED ORDER — HALOPERIDOL LACTATE 5 MG/ML IJ SOLN
0.5000 mg | Freq: Four times a day (QID) | INTRAMUSCULAR | Status: DC | PRN
  Administered 2013-04-02 – 2013-04-05 (×4): 0.5 mg via INTRAVENOUS
  Filled 2013-04-01 (×4): qty 0.1

## 2013-04-01 NOTE — Progress Notes (Signed)
ANTIBIOTIC CONSULT NOTE - Follow-up  Pharmacy Consult for Cefepime Indication: R/o UTI, severe sepsis  Allergies  Allergen Reactions  . Clarithromycin Other (See Comments)    Unknown   . Codeine Other (See Comments)    Unknown   . Penicillins Other (See Comments)    Unknown   . Tequin (Gatifloxacin) Other (See Comments)    Unknown     Patient Measurements: Height: 5\' 4"  (162.6 cm) Weight: 132 lb 7.9 oz (60.1 kg) IBW/kg (Calculated) : 54.7  Vital Signs: Temp: 97.6 F (36.4 C) (04/26 0426) Temp src: Oral (04/26 0426) BP: 148/96 mmHg (04/26 0700) Pulse Rate: 99 (04/26 0700) Intake/Output from previous day: 04/25 0701 - 04/26 0700 In: 240 [P.O.:240] Out: -  Intake/Output from this shift: Total I/O In: 240 [P.O.:240] Out: -   Labs:  Recent Labs  03/30/13 0336 03/31/13 0455 04/01/13 0511  WBC 11.3* 10.5 11.2*  HGB 10.9* 9.9* 11.2*  PLT 215 188 244  CREATININE 1.02 0.96 1.00   Estimated Creatinine Clearance: 34.9 ml/min (by C-G formula based on Cr of 1). No results found for this basename: VANCOTROUGH, VANCOPEAK, VANCORANDOM, GENTTROUGH, GENTPEAK, GENTRANDOM, TOBRATROUGH, TOBRAPEAK, TOBRARND, AMIKACINPEAK, AMIKACINTROU, AMIKACIN,  in the last 72 hours   Microbiology: Recent Results (from the past 720 hour(s))  CULTURE, BLOOD (ROUTINE X 2)     Status: None   Collection Time    03/25/13  8:30 PM      Result Value Range Status   Specimen Description BLOOD RIGHT ARM   Final   Special Requests BOTTLES DRAWN AEROBIC ONLY 10CC   Final   Culture  Setup Time     Final   Value: 03/26/2013 03:10     Two isolates with different morphologies were identified as the same organism.The most resistant organism was reported.   Culture     Final   Value: ESCHERICHIA COLI     Note: Gram Stain Report Called to,Read Back By and Verified With: TERESA CRITE 03/26/13 @ 6PM BY RUSCA.   Report Status 03/29/2013 FINAL   Final   Organism ID, Bacteria ESCHERICHIA COLI   Final   CULTURE, BLOOD (ROUTINE X 2)     Status: None   Collection Time    03/25/13  8:40 PM      Result Value Range Status   Specimen Description BLOOD RIGHT HAND   Final   Special Requests BOTTLES DRAWN AEROBIC AND ANAEROBIC 10CC EA   Final   Culture  Setup Time 03/26/2013 03:10   Final   Culture     Final   Value:        BLOOD CULTURE RECEIVED NO GROWTH TO DATE CULTURE WILL BE HELD FOR 5 DAYS BEFORE ISSUING A FINAL NEGATIVE REPORT   Report Status PENDING   Incomplete  URINE CULTURE     Status: None   Collection Time    03/25/13 10:54 PM      Result Value Range Status   Specimen Description URINE, CATHETERIZED   Final   Special Requests ADDED 2313   Final   Culture  Setup Time 03/26/2013 13:21   Final   Colony Count 50,000 COLONIES/ML   Final   Culture ESCHERICHIA COLI   Final   Report Status 03/28/2013 FINAL   Final   Organism ID, Bacteria ESCHERICHIA COLI   Final  MRSA PCR SCREENING     Status: None   Collection Time    03/26/13  3:17 AM      Result Value Range Status  MRSA by PCR NEGATIVE  NEGATIVE Final   Comment:            The GeneXpert MRSA Assay (FDA     approved for NASAL specimens     only), is one component of a     comprehensive MRSA colonization     surveillance program. It is not     intended to diagnose MRSA     infection nor to guide or     monitor treatment for     MRSA infections.  CULTURE, BLOOD (ROUTINE X 2)     Status: None   Collection Time    03/30/13 11:30 AM      Result Value Range Status   Specimen Description BLOOD LEFT ARM   Final   Special Requests BOTTLES DRAWN AEROBIC AND ANAEROBIC 10CC   Final   Culture  Setup Time 03/30/2013 22:08   Final   Culture     Final   Value:        BLOOD CULTURE RECEIVED NO GROWTH TO DATE CULTURE WILL BE HELD FOR 5 DAYS BEFORE ISSUING A FINAL NEGATIVE REPORT   Report Status PENDING   Incomplete  CULTURE, BLOOD (ROUTINE X 2)     Status: None   Collection Time    03/30/13 11:35 AM      Result Value Range Status    Specimen Description BLOOD LEFT HAND   Final   Special Requests BOTTLES DRAWN AEROBIC AND ANAEROBIC 10CC   Final   Culture  Setup Time 03/30/2013 22:08   Final   Culture     Final   Value:        BLOOD CULTURE RECEIVED NO GROWTH TO DATE CULTURE WILL BE HELD FOR 5 DAYS BEFORE ISSUING A FINAL NEGATIVE REPORT   Report Status PENDING   Incomplete   Assessment: 79 yo female presented with altered mental status and was started on vancomycin + cefepime. Vancomycin has been discontinued based on cultures. Renal function has improved and is now stable.   Vanc 4/20>>4/22 Cefepime 4/20>>  4/19 blood x2 - GNR 1/2 4/19 urine - Ecoli 4/20 MRSA - NEG 4/24 Blood - NGTD  Plan:  1. MD - consider de-escalating therapy to cipro or ceftin. Macrobid is contraindicated based on her renal function and age.  2. F/u renal fxn, C&S, clinical status and abx de-escalation  Raynetta Osterloh, Drake Leach 04/01/2013,11:15 AM

## 2013-04-01 NOTE — Progress Notes (Addendum)
Called to speak with Ms. Eagleton's daughter at bedside.  While going to CT today, she began to develop acute shortness of breath and drooling and inability to take her pills.  She did have a CT scan of the head which is negative for any acute abnormality.  Ms. Blasdel is awake when seen, states she is short of breath.  She is confused, but answers questions when asked.   Of note, Ms. Deyo has been receiving ativan about 3 times a day since being here.  Per daughter and caregiver, she was not able to tolerate BZDs in the past due to confusion and sedation.  I think this current issue may be related to polypharmacy.    I have checked a CXR, will check a cbc and bmet, stop ativan and other bzds, haldol IV for agitation (previous EKGs with ok QTC, 425 on 4/19; 488 on 4/22).  If patient tachy (not on my exam) or with decreasing BP, will get BC X 2 and EKG/Trop for further evaluation.   UPDATE: CXR okay, labs so far are okay with mild elevation in K, stopped IV fluids with K and started normal saline.    I think this is related to Effexor withdrawal (causing anxiety/agitation) and BZD use which is known to cause somnolence and agitation in Ms. Twyman (from family report today).  I have stopped ativan.  No flumazenil at this time 2/2 risk for withdrawal seizure.  Monitor overnight.  No BZD and allow them to wash out of the system.  Would only give haldol for severe agitation overnight.

## 2013-04-01 NOTE — Progress Notes (Signed)
Pt's family concerned about her appearance. Pt's daughter stated she turned gray and then color came back. Vital signs taken. BP 160/95. BP 100 ST. 99% on room air. 98.7 axillary. Patient not following commands which is new per caregiver. MD paged about families concerns. Will continue to monitor closely. Dion Saucier

## 2013-04-01 NOTE — Progress Notes (Signed)
STAT labs ordered. Swallow study evaluation ordered. Family updated. Dion Saucier

## 2013-04-01 NOTE — Progress Notes (Signed)
[facility]  PROGRESS NOTE   Brief narrative:  Chelsea Lynch is an 79 y.o. female with a past medical history of hypothyroidism, depression, anxiety, and chronic pain who was admitted on 03/26/2013 with altered mental status, hypotension, and septic shock from a urinary source.  Assessment/Plan:  Principal Problem:  Septic shock(785.52) / urosepsis / bacteremia due to gram-negative bacteria  - Source from UTI given gram-negative rod bacteremia and Escherichia coli positive urine culture.  - UC 4/19 - + e. Coli, sensitive to macrobid and cipro; BC 1/3 + for e. Coli on 4/19, follow up BC NGTD - 14 day course of antibiotics recommended, consider macrobid today - Cefepime, E.coli is sensitive - HD stable today - PO Abx tomorrow if can tolerate PO consistently.   Active Problems:  Hypokalemia  - Resolved with supplementation  Septic encephalopathy, delirium, altered mental status, possible opiate withdrawal  - Likely 2/2 known infection and narcotics withdrawal, she is about the same today, family at bedside notes that she is much altered and having problem with orientation questions - Underlying encephalopathy from sepsis and delirium.  Improvement in this area may lag behind improvement in infection  - As patient not improved today, will get non contrast CT head, but do not anticipate any acute findings. Daughter Chelsea Lynch reports that her symptoms were abrupt in onset on Tuesday and that before this time her mother was her normal self, but improving.  - Abrupt withdrawal of effexor can cause some side effects, but these are usually related to dizziness and anxiety.  Not clear that withdrawal from this medication could be contributing.  Will restart.  - Chelsea Lynch does have underlying MDI/Dementia.  ? If this could be day/night reversal or related to being in a new environment as well.  - Continue OxyContin 10 mg Q 12 hours, and provide supportive care. Treat underlying conditions.    Hypothyroidism  - Continue Synthroid.   Normocytic anemia  -Hemoglobin stable    Gastroesophageal reflux disease  -Continue Protonix.   Acute renal failure  -Resolved.   Left lower lobe infiltrate rule out aspiration pneumonia  - Atelectasis versus pneumonia. On cefepime.  - F/U CT showed no acute infiltrate.  - Sputum culture not collected.   Elevated troponin / SVT  - Likely from demand ischemia in the setting of sepsis.  - Cardiologist is following with plans to increase beta blocker dose to home dose.  - Outpatient myocardial perfusion study recommended. They have signed off.    Code Status: Full.  Family Communication: Updated daughter in room.  She is very concerned re: continued altered sensorium.  She is agreeable to non contrast CT of the head.   Disposition Plan: Home soon.   Medical Consultants:  Dr. Thurmon Fair, Cardiology  Dr. Shan Levans, PCCM  Other Consultants:  Occupational therapy: Home OT.  physical therapy: Home health PT with 24-hour supervision recommended.  Anti-infectives:  Cefepime 03/26/2013 --->  Vancomycin 03/26/2013 ---> 4/24   Subjective: Chelsea Lynch was sitting in the chair today.  She remembered me by face, but could not answer orientation questions, calling her daughter her mother.    Objective: Vital signs in last 24 hours: Temp:  [97.5 F (36.4 C)-97.9 F (36.6 C)] 97.6 F (36.4 C) (04/26 0426) Pulse Rate:  [75-99] 99 (04/26 0700) Resp:  [17-18] 18 (04/26 0426) BP: (148-166)/(96-120) 148/96 mmHg (04/26 0700) SpO2:  [95 %-100 %] 95 % (04/26 0426) Weight:  [132 lb 7.9 oz (60.1 kg)] 132 lb 7.9  oz (60.1 kg) (04/26 0426) Weight change: 0 lb (0 kg) Last BM Date: 03/30/13  Intake/Output from previous day: 04/25 0701 - 04/26 0700 In: 240 [P.O.:240] Out: -  Intake/Output this shift:    General appearance: alert, cooperative, oriented only to person Head: NCAT Eyes: EOMI, anicteric sclerae Resp: clear to auscultation  bilaterally without wheezing Cardio: RR, NR, no murmur noted GI: soft,  +BS Extremities: thin, + overlapping toes on the right foot Neuro: Alert, moves all extremities, follows commands, somewhat somnolent  Lab Results:  Recent Labs  03/31/13 0455 04/01/13 0511  WBC 10.5 11.2*  HGB 9.9* 11.2*  HCT 28.1* 31.6*  PLT 188 244   BMET  Recent Labs  03/31/13 0455 04/01/13 0511  NA 136 135  K 4.4 4.9  CL 106 105  CO2 22 20  GLUCOSE 88 91  BUN 10 9  CREATININE 0.96 1.00  CALCIUM 8.8 9.4    Studies/Results: Dg Chest Port 1 View  03/30/2013  *RADIOLOGY REPORT*  Clinical Data: Recent blood infection question pneumonia  PORTABLE CHEST - 1 VIEW  Comparison: Portable exam 1135 hours compared to 03/27/2013  Findings: 5 joules of central venous catheter stable with tip projecting over SVC. Intraspinal stimulator leads unchanged. Normal heart size and pulmonary vascularity. Calcified aortic arch with question mild aneurysmal dilatation. Pulmonary vascularity normal. Minimal peribronchial thickening without infiltrate, pleural effusion or pneumothorax. Bones demineralized.  IMPRESSION: Mild bronchitic changes without acute infiltrate. Suspected aneurysmal dilatation of the aortic arch; follow-up CT chest recommended to assess aortic dimensions.   Original Report Authenticated By: Ulyses Southward, M.D.     Medications: I have reviewed the patient's current medications.   LOS: 7 days   Chelsea Lynch 04/01/2013, 8:08 AM

## 2013-04-02 DIAGNOSIS — R1310 Dysphagia, unspecified: Secondary | ICD-10-CM | POA: Diagnosis present

## 2013-04-02 DIAGNOSIS — R404 Transient alteration of awareness: Secondary | ICD-10-CM

## 2013-04-02 MED ORDER — NITROGLYCERIN 0.4 MG SL SUBL
0.4000 mg | SUBLINGUAL_TABLET | SUBLINGUAL | Status: DC | PRN
Start: 2013-04-02 — End: 2013-04-06
  Administered 2013-04-02 (×2): 0.4 mg via SUBLINGUAL

## 2013-04-02 MED ORDER — NITROGLYCERIN 0.4 MG SL SUBL
SUBLINGUAL_TABLET | SUBLINGUAL | Status: AC
  Administered 2013-04-02: 0.4 mg
  Filled 2013-04-02: qty 25

## 2013-04-02 MED ORDER — MORPHINE SULFATE 2 MG/ML IJ SOLN
1.0000 mg | INTRAMUSCULAR | Status: DC | PRN
  Administered 2013-04-02 – 2013-04-03 (×2): 1 mg via INTRAVENOUS
  Filled 2013-04-02 (×2): qty 1

## 2013-04-02 MED ORDER — HYDRALAZINE HCL 20 MG/ML IJ SOLN
10.0000 mg | Freq: Four times a day (QID) | INTRAMUSCULAR | Status: DC | PRN
  Administered 2013-04-02: 10 mg via INTRAVENOUS
  Filled 2013-04-02 (×2): qty 1

## 2013-04-02 MED ORDER — NITROGLYCERIN 0.4 MG SL SUBL
SUBLINGUAL_TABLET | SUBLINGUAL | Status: AC
  Filled 2013-04-02: qty 25

## 2013-04-02 MED ORDER — ACETAMINOPHEN 650 MG RE SUPP
650.0000 mg | RECTAL | Status: DC | PRN
Start: 2013-04-02 — End: 2013-04-06
  Administered 2013-04-03: 650 mg via RECTAL
  Filled 2013-04-02: qty 1

## 2013-04-02 MED ORDER — NITROGLYCERIN 2 % TD OINT
0.5000 [in_us] | TOPICAL_OINTMENT | Freq: Three times a day (TID) | TRANSDERMAL | Status: DC
  Administered 2013-04-02 – 2013-04-04 (×5): 0.5 [in_us] via TOPICAL
  Filled 2013-04-02: qty 30

## 2013-04-02 NOTE — Progress Notes (Signed)
VS obtained and revealed BP 185/102. MD notified and received order to give 10mg  Hydralazine IV. RX given. Recheck BP 169/94. Will continue to monitor pt.

## 2013-04-02 NOTE — Progress Notes (Signed)
Pt with continued restlessness and agitation despite repositioning and diversion techniques. 0.5 mg Haldol IV given to pt. Will continue to monitor pt closely.

## 2013-04-02 NOTE — Progress Notes (Signed)
[facility] PROGRESS NOTE  Chelsea Lynch ZOX:[id number] DOB: May 30, 1925 DOA: 03/25/2013 PCP: [name], MD  Assessment/Plan: Principal Problem:   Septic shock(785.52) Active Problems:   BACK PAIN, CHRONIC- on chronic narcotics at home   Urosepsis   Bacteremia due to Gram-negative bacteria   Aspiration pneumonia   Acute on chronic renal insufficiency- resolved   Altered mental status- suspected to be secondary to sepsis and narcotic withdrawl   Unspecified hypothyroidism   Normocytic anemia   GERD (gastroesophageal reflux disease)   Infiltrate noted on imaging study   Elevated troponin- felt to be secondary to SVT   SVT (supraventricular tachycardia)   Septic encephalopathy   Acute delirium   Opiate withdrawal    1. Septic shock(785.52)/urosepsis/bacteremia due to gram-negative bacteria:  Patient presented with altered mental status, hypotension, and clinical findings, consistent with septic shock. Septic work up revealed UTI, gram-negative rod bacteremia and pansensitive Escherichia coli on urine culture. Patient was managed with appropriate antibiotics, if Cephalosporin, with satisfactory clinical response. She is afebrile, and wcc has trended down nicely. Planning a 14-day course of antibiotics to be concluded on 04/07/13.  2. Hypokalemia: Repleted as indicated. Resolved.  3. Altered mental status/Encephalopathy: Patient has had altered mental status during her hospitalization, which is multi-factorial, secondary to known infection, narcotics withdrawal, and possibly benzodiazepines, as well as abrupt withdrawal of Effexor can cause some side effects, but these are usually related to dizziness and anxiety. She is currently disoriented, forgetful and has a mild tremor. Unfortunately, she is unable to take oral medication, due to dysphagia, noted on 04/01/13. Managing with prn Haldol, for agitation. If needed, a single dose of Ativan, will be considered, as patient has had  insomnia fro the past 3 days, per family. Head CT scan of 04/01/13, showed atrophy and chronic microvascular ischemia. No acute abnormality. 4. Hypothyroidism: On thyroxine replacement therapy.  5. Normocytic anemia: HB is stable/reasonable.  6. Gastroesophageal reflux disease; Not problematic on PPI. Marland Kitchen  7. Acute renal failure: Creatinine was 1.81 at presentation, BUN 30. Responded to iv fluids, and AKI has resolved. As of 04/02/13, creatinine is 1.13.  Resolved.  7. Left lower lobe infiltrate rule out aspiration pneumonia:  Initial CXR revealed atelectasis versus pneumonia, which was felt adequately covered with Cefepime. Follow up chest CT scan showed no consolidation.   CXR of 04/01/13 showed curvilinear left lower lobe atelectasis only.  7. Elevated troponin/SVT/Chest pain: Patient had elevated troponin and transient SVT, in the initial days of hospitalization. Cardiology consultation was provided by Dr Nanetta Batty, who has opined that these findings were likely from demand ischemia in the setting of sepsis. Patient does have a known history of PAF/Atrial tachycardia. Currently on beta-blocker. Outpatient myocardial perfusion study recommended. On 04/02/13, patient as an episode of chest pressure, relieved with SL NTG x 2. No SOB. Will cycle cardiac enzymes again, and pace on Nitropaste.   8. HTN: BP significantly elevated today. Have placed patient on prn Hydralzaine.  9. Dysphagia: Patient reportedly, developed dysphagia on 04/01/13. Evaluated by SLP today. Anticipating formal study on 04/03/13. Meanwhile, holding POs.    Code Status: Full Code.  Family Communication: Discussed in detail, with family at bedside.  Disposition Plan: To be determined.    Brief narrative: Chelsea Lynch is an 79 y.o. female with a past medical history of hypothyroidism, depression, anxiety, and chronic pain who was admitted on 03/26/2013 with altered mental status, hypotension, and septic shock from a urinary  source.   Consultants:  Dr  Nanetta Batty, cardiologist.   Procedures:  CXRs, Head CT scan, Chest CT scan.   Antibiotics:  Cefepime.   HPI/Subjective: Disoriented, forgetful. Mildly tremulous.   Objective: Vital signs in last 24 hours: Temp:  [97.2 F (36.2 C)-98.7 F (37.1 C)] 97.2 F (36.2 C) (04/27 1230) Pulse Rate:  [93-130] 130 (04/27 1530) Resp:  [18-22] 18 (04/27 1230) BP: (144-185)/(77-113) 144/77 mmHg (04/27 1530) SpO2:  [97 %-99 %] 99 % (04/27 1514) Weight:  [59.9 kg (132 lb 0.9 oz)] 59.9 kg (132 lb 0.9 oz) (04/27 0413) Weight change: -0.2 kg (-7.1 oz) Last BM Date: 03/30/13  Intake/Output from previous day: 04/26 0701 - 04/27 0700 In: 360 [P.O.:360] Out: -      Physical Exam: General: Comfortable, alert, communicative, fully oriented, not short of breath at rest.  HEENT:  Mild clinical pallor, no jaundice, no conjunctival injection or discharge. NECK:  Supple, JVP not seen, no carotid bruits, no palpable lymphadenopathy, no palpable goiter. CHEST:  Clinically clear to auscultation, no wheezes, no crackles. HEART:  Sounds 1 and 2 heard, normal, regular, no murmurs. ABDOMEN:  Full, soft, non-tender, no palpable organomegaly, no palpable masses. GENITALIA:  Not examined. LOWER EXTREMITIES:  No pitting edema, palpable peripheral pulses. MUSCULOSKELETAL SYSTEM:  Generalized osteoarthritic changes, otherwise, normal. CENTRAL NERVOUS SYSTEM:  Tremulous, otherwise, no focal neurologic deficit on gross examination.  Lab Results:  Recent Labs  04/01/13 0511 04/01/13 1639  WBC 11.2* 11.1*  HGB 11.2* 11.4*  HCT 31.6* 32.1*  PLT 244 287    Recent Labs  04/01/13 0511 04/01/13 1639  NA 135 135  K 4.9 5.3*  CL 105 105  CO2 20 19  GLUCOSE 91 91  BUN 9 12  CREATININE 1.00 1.13*  CALCIUM 9.4 9.4   Recent Results (from the past 240 hour(s))  CULTURE, BLOOD (ROUTINE X 2)     Status: None   Collection Time    03/25/13  8:30 PM      Result Value  Range Status   Specimen Description BLOOD RIGHT ARM   Final   Special Requests BOTTLES DRAWN AEROBIC ONLY 10CC   Final   Culture  Setup Time     Final   Value: 03/26/2013 03:10     Two isolates with different morphologies were identified as the same organism.The most resistant organism was reported.   Culture     Final   Value: ESCHERICHIA COLI     Note: Gram Stain Report Called to,Read Back By and Verified With: TERESA CRITE 03/26/13 @ 6PM BY RUSCA.   Report Status 03/29/2013 FINAL   Final   Organism ID, Bacteria ESCHERICHIA COLI   Final  CULTURE, BLOOD (ROUTINE X 2)     Status: None   Collection Time    03/25/13  8:40 PM      Result Value Range Status   Specimen Description BLOOD RIGHT HAND   Final   Special Requests BOTTLES DRAWN AEROBIC AND ANAEROBIC 10CC EA   Final   Culture  Setup Time 03/26/2013 03:10   Final   Culture NO GROWTH 5 DAYS   Final   Report Status 04/01/2013 FINAL   Final  URINE CULTURE     Status: None   Collection Time    03/25/13 10:54 PM      Result Value Range Status   Specimen Description URINE, CATHETERIZED   Final   Special Requests ADDED 2313   Final   Culture  Setup Time 03/26/2013 13:21   Final  Colony Count 50,000 COLONIES/ML   Final   Culture ESCHERICHIA COLI   Final   Report Status 03/28/2013 FINAL   Final   Organism ID, Bacteria ESCHERICHIA COLI   Final  MRSA PCR SCREENING     Status: None   Collection Time    03/26/13  3:17 AM      Result Value Range Status   MRSA by PCR NEGATIVE  NEGATIVE Final   Comment:            The GeneXpert MRSA Assay (FDA     approved for NASAL specimens     only), is one component of a     comprehensive MRSA colonization     surveillance program. It is not     intended to diagnose MRSA     infection nor to guide or     monitor treatment for     MRSA infections.  CULTURE, BLOOD (ROUTINE X 2)     Status: None   Collection Time    03/30/13 11:30 AM      Result Value Range Status   Specimen Description BLOOD  LEFT ARM   Final   Special Requests BOTTLES DRAWN AEROBIC AND ANAEROBIC 10CC   Final   Culture  Setup Time 03/30/2013 22:08   Final   Culture     Final   Value:        BLOOD CULTURE RECEIVED NO GROWTH TO DATE CULTURE WILL BE HELD FOR 5 DAYS BEFORE ISSUING A FINAL NEGATIVE REPORT   Report Status PENDING   Incomplete  CULTURE, BLOOD (ROUTINE X 2)     Status: None   Collection Time    03/30/13 11:35 AM      Result Value Range Status   Specimen Description BLOOD LEFT HAND   Final   Special Requests BOTTLES DRAWN AEROBIC AND ANAEROBIC 10CC   Final   Culture  Setup Time 03/30/2013 22:08   Final   Culture     Final   Value:        BLOOD CULTURE RECEIVED NO GROWTH TO DATE CULTURE WILL BE HELD FOR 5 DAYS BEFORE ISSUING A FINAL NEGATIVE REPORT   Report Status PENDING   Incomplete  CULTURE, BLOOD (ROUTINE X 2)     Status: None   Collection Time    04/01/13  5:11 PM      Result Value Range Status   Specimen Description BLOOD LEFT WRIST   Final   Special Requests BOTTLES DRAWN AEROBIC AND ANAEROBIC 10CC   Final   Culture  Setup Time 04/01/2013 21:28   Final   Culture     Final   Value:        BLOOD CULTURE RECEIVED NO GROWTH TO DATE CULTURE WILL BE HELD FOR 5 DAYS BEFORE ISSUING A FINAL NEGATIVE REPORT   Report Status PENDING   Incomplete  CULTURE, BLOOD (ROUTINE X 2)     Status: None   Collection Time    04/01/13  5:20 PM      Result Value Range Status   Specimen Description BLOOD RIGHT WRIST   Final   Special Requests BOTTLES DRAWN AEROBIC AND ANAEROBIC 10CC   Final   Culture  Setup Time 04/01/2013 21:29   Final   Culture     Final   Value:        BLOOD CULTURE RECEIVED NO GROWTH TO DATE CULTURE WILL BE HELD FOR 5 DAYS BEFORE ISSUING A FINAL NEGATIVE REPORT   Report Status PENDING  Incomplete     Studies/Results: Ct Head Wo Contrast  04/01/2013  *RADIOLOGY REPORT*  Clinical Data: Confusion, encephalopathy.  Word finding difficulty  CT HEAD WITHOUT CONTRAST  Technique:  Contiguous axial  images were obtained from the base of the skull through the vertex without contrast.  Comparison: None  Findings: Image quality degraded by motion.  Moderate to advanced atrophy.  Moderate chronic microvascular ischemia throughout the white matter bilaterally.  Negative for acute infarct.  Negative for hemorrhage or mass lesion.  Calvarium is negative.  IMPRESSION: Atrophy and chronic microvascular ischemia.  No acute abnormality.   Original Report Authenticated By: Janeece Riggers, M.D.    Dg Chest Port 1 View  04/01/2013  *RADIOLOGY REPORT*  Clinical Data: Acute shortness of breath  PORTABLE CHEST - 1 VIEW  Comparison: 03/30/2013  Findings: Neural stimulator again noted.  Right IJ central line tip over mid to distal SVC.  Heart size normal.  Aortic ectasia again noted.  Curvilinear left lower lobe probable atelectasis noted. Right costophrenic angle is omitted from the field of view of the right lung is grossly clear. Evidence of resection or osteolysis of the distal right clavicle again noted.  IMPRESSION: Curvilinear left lower lobe atelectasis.   Original Report Authenticated By: Christiana Pellant, M.D.     Medications: Scheduled Meds: . aspirin  81 mg Oral Daily  . ceFEPime (MAXIPIME) IV  1 g Intravenous Q24H  . heparin  5,000 Units Subcutaneous Q8H  . levothyroxine  75 mcg Oral QAC breakfast  . metoprolol succinate  25 mg Oral Daily  . OxyCODONE  10 mg Oral Q12H  . pantoprazole (PROTONIX) IV  40 mg Intravenous Q2200  . senna-docusate  1 tablet Oral BID  . traZODone  100 mg Oral QHS  . venlafaxine XR  75 mg Oral BID   Continuous Infusions: . sodium chloride 1,000 mL (04/02/13 0430)   PRN Meds:.acetaminophen, acetaminophen, docusate sodium, haloperidol lactate, hydrALAZINE, morphine injection, nitroGLYCERIN, ondansetron (ZOFRAN) IV, oxyCODONE-acetaminophen, sodium chloride    LOS: 8 days   Destry Dauber,CHRISTOPHER  [facility] Pager [phone]. If 8PM-8AM, please contact night-coverage at  [email or web address], password [facility] 04/02/2013, 4:00 PM  LOS: 8 days

## 2013-04-02 NOTE — Evaluation (Signed)
Clinical/Bedside Swallow Evaluation Patient Details  Name: Chelsea Lynch MRN: [id number] Date of Birth: 02-02-1925  Today's Date: 04/02/2013 Time: 1545-1600 SLP Time Calculation (min): 15 min  Past Medical History:  Past Medical History  Diagnosis Date  . Hypothyroid   . GERD (gastroesophageal reflux disease)   . Depression   . Neuropathy   . Constipation, chronic   . Insomnia   . Anxiety   . Chronic pain    Past Surgical History: History reviewed. No pertinent past surgical history. HPI:  79 years old female presents with AMS, urinary symptoms and hypotension.  Patient referred for BSE to assess risk for aspiration due reports of difficulty swallowing.  Nursing reports patient refusing PO medication.    Assessment / Plan / Recommendation Clinical Impression  Evaluation limited due to patient declining trials of soft and regular solids.  Patient observed directly with thin water by cup x2 with immediate expectoration on second swallow.  Oral holding with facial grimacing prior to swallow of trial puree.   Patient with gagging and expectorating bolus prior to swallow.  Family present reports patient swallowed juice and ice cream and soup w/o difficulty in am .  Question behavior vs.esophageal involvement.  Patient with AMS but stated solids were "difficult to get down".  May benefit from clear liquid diet.  ST to follow on 4/28 to assess swallow with solids if appropriate.  Completion of objective evaluation to be determined.      Aspiration Risk  Moderate    Diet Recommendation Thin liquid   Liquid Administration via: Cup;Straw Medication Administration: Crushed with puree Supervision: Patient able to self feed;Full supervision/cueing for compensatory strategies Compensations: Slow rate;Small sips/bites Postural Changes and/or Swallow Maneuvers: Out of bed for meals;Seated upright 90 degrees;Upright 30-60 min after meal    Other  Recommendations Oral Care Recommendations: Oral  care QID   Follow Up Recommendations   (TBD)    Frequency and Duration min 2x/week  2 weeks       SLP Swallow Goals Patient will utilize recommended strategies during swallow to increase swallowing safety with: Minimal cueing   Swallow Study Prior Functional Status   Lives at home with spouse with no prior history of dysphagia     General Date of Onset: 04/01/13 HPI: 79 years old female presents with AMS, urinary symptoms and hypotension. SVT Type of Study: Bedside swallow evaluation Diet Prior to this Study: Regular;Thin liquids Temperature Spikes Noted: No Respiratory Status: Room air History of Recent Intubation: No Behavior/Cognition: Alert;Pleasant mood;Confused;Requires cueing Oral Cavity - Dentition: Adequate natural dentition Self-Feeding Abilities: Able to feed self;Needs assist Patient Positioning: Upright in chair Baseline Vocal Quality: Clear Volitional Cough: Strong Volitional Swallow: Able to elicit    Oral/Motor/Sensory Function Overall Oral Motor/Sensory Function: Appears within functional limits for tasks assessed   Ice Chips Ice chips: Not tested   Thin Liquid Thin Liquid: Impaired Presentation: Cup Oral Phase Functional Implications: Oral holding Other Comments: expectorated bolus    Nectar Thick Nectar Thick Liquid: Not tested   Honey Thick Honey Thick Liquid: Not tested   Puree Puree: Impaired Oral Phase Functional Implications: Oral holding Other Comments: expectorated bolus   Solid   GO    Solid: Not tested      Moreen Fowler MS, CCC-SLP [phone] [facility] 04/02/2013,6:07 PM

## 2013-04-02 NOTE — Progress Notes (Signed)
Pt reports chest pressure. Pt unable to report rating on 0-10 scale. On FACES scale, 8 would be appropriate. 1 NTG SL given. After 5 minutes per pt, some relief. Pt still unable to report pain on 0-10 scale. Per FACES, 4 would be appropriate.  BP 146/93. 1 NT Sl given. Pt sitting in chair talking with family. Reports relief of pain. BP 144/77. VSS and documented in EPIC. Will cont to monitor pt closely.

## 2013-04-03 LAB — TROPONIN I
Troponin I: <0.3 ng/mL (ref <0.30)
Troponin I: <0.3 ng/mL (ref <0.30)

## 2013-04-03 LAB — CBC
HCT: 27.8 % — ABNORMAL LOW (ref 36.0–46.0)
Hemoglobin: 10.1 g/dL — ABNORMAL LOW (ref 12.0–15.0)
MCH: 30.2 pg (ref 26.0–34.0)
RBC: 3.34 MIL/uL — ABNORMAL LOW (ref 3.87–5.11)

## 2013-04-03 LAB — BASIC METABOLIC PANEL
BUN: 9 mg/dL (ref 6–23)
CO2: 20 mEq/L (ref 19–32)
Glucose, Bld: 96 mg/dL (ref 70–99)
Potassium: 3.5 mEq/L (ref 3.5–5.1)
Sodium: 135 mEq/L (ref 135–145)

## 2013-04-03 MED ORDER — METOPROLOL SUCCINATE ER 25 MG PO TB24
25.0000 mg | ORAL_TABLET | Freq: Once | ORAL | Status: AC
  Administered 2013-04-03: 25 mg via ORAL
  Filled 2013-04-03: qty 1

## 2013-04-03 MED ORDER — METOPROLOL SUCCINATE ER 50 MG PO TB24
50.0000 mg | ORAL_TABLET | Freq: Every day | ORAL | Status: DC
  Administered 2013-04-04 – 2013-04-06 (×3): 50 mg via ORAL
  Filled 2013-04-03 (×3): qty 1

## 2013-04-03 NOTE — Progress Notes (Addendum)
Speech Language Pathology Dysphagia Treatment Patient Details Name: Chelsea Lynch MRN: [id number] DOB: [birth year] Today's Date: 04/03/2013 Time: 9562-1308 SLP Time Calculation (min): 19 min  Assessment / Plan / Recommendation Clinical Impression  Pt. seen  this morning with po's.  Daughter received report that pt. produced some emesis this morning with breakfast.  Immediate cough after first bite jello and delayed cough after one out of 4-5 sips juice. Deficits appear more GI related, however pt.'s daughter reported she suspects her mom may be having TIA's.  Pt. may need an MBS if future observations indicate pharyngeal dysphagia.  Recommend Dys 3 diet texture (if ok with MD from GI standpoint) and thin. Is GI consult warranted? ST will continue to follow closely.     Diet Recommendation  Initiate / Change Diet: Dysphagia 3 (mechanical soft);Thin liquid (if ok with MD from GI standpoint)    SLP Plan Continue with current plan of care   Pertinent Vitals/Pain none   Swallowing Goals  SLP Swallowing Goals Patient will utilize recommended strategies during swallow to increase swallowing safety with: Minimal cueing Swallow Study Goal #2 - Progress: Progressing toward goal  General Temperature Spikes Noted: No Respiratory Status: Room air Behavior/Cognition: Confused;Requires cueing;Cooperative Oral Cavity - Dentition: Adequate natural dentition Patient Positioning: Upright in chair  Oral Cavity - Oral Hygiene Does patient have any of the following "at risk" factors?: Nutritional status - inadequate Brush patient's teeth BID with toothbrush (using toothpaste with fluoride): Yes Patient is AT RISK - Oral Care Protocol followed (see row info): Yes   Dysphagia Treatment Treatment focused on: Skilled observation of diet tolerance Treatment Methods/Modalities: Skilled observation Patient observed directly with PO's: Yes Type of PO's observed: Dysphagia 1 (puree);Thin liquids Feeding: Able  to feed self Liquids provided via: Cup;Straw Pharyngeal Phase Signs & Symptoms: Immediate cough Type of cueing: Verbal Amount of cueing: Minimal   GO     [name] M.Ed [employer] [phone]  04/03/2013

## 2013-04-03 NOTE — Progress Notes (Signed)
PT Cancellation Note  Patient Details Name: Chelsea Lynch MRN: [id number] DOB: September 27, 1925   Cancelled Treatment:    Reason Eval/Treat Not Completed: Fatigue/lethargy limiting ability to participate. Spoke with patient and family. Pt politely requesting to rest as she has not been able to and had a rough night. Will attempt to see tomorrow am.   Fabio Asa 04/03/2013, 3:24 PM

## 2013-04-03 NOTE — Progress Notes (Signed)
OT Cancellation Note  Patient Details Name: Chelsea Lynch MRN: [id number] DOB: 1925/06/13   Cancelled Treatment:    Reason Eval/Treat Not Completed: Fatigue/lethargy limiting ability to participate. Pt. Requested to rest; will attempt tx later date.  Connee Ikner A 04/03/2013, 3:45 PM

## 2013-04-03 NOTE — Progress Notes (Addendum)
[facility] PROGRESS NOTE  Chelsea Lynch:[id number] DOB: 1925/10/15 DOA: 03/25/2013 PCP: [name], MD  Assessment/Plan: Principal Problem:   Septic shock(785.52) Active Problems:   BACK PAIN, CHRONIC- on chronic narcotics at home   Urosepsis   Bacteremia due to Gram-negative bacteria   Aspiration pneumonia   Acute on chronic renal insufficiency- resolved   Altered mental status- suspected to be secondary to sepsis and narcotic withdrawl   Unspecified hypothyroidism   Normocytic anemia   GERD (gastroesophageal reflux disease)   Infiltrate noted on imaging study   Elevated troponin- felt to be secondary to SVT   SVT (supraventricular tachycardia)   Septic encephalopathy   Acute delirium   Opiate withdrawal   Dysphagia, unspecified    1. Septic shock(785.52)/urosepsis/bacteremia due to gram-negative bacteria:  Patient presented with altered mental status, hypotension, and clinical findings, consistent with septic shock. Septic work up revealed UTI, gram-negative rod bacteremia and pansensitive Escherichia coli on urine culture. Patient was managed with appropriate antibiotics, if Cephalosporin, with satisfactory clinical response. She is afebrile, and wcc has trended down nicely. Planning a 14-day course of antibiotics to be concluded on 04/07/13. Clinically, sepsis has resolved.  2. Hypokalemia: Repleted as indicated. Resolved.  3. Altered mental status/Encephalopathy: Patient has had altered mental status during her hospitalization, which is multi-factorial, secondary to known infection, narcotics withdrawal, and possibly benzodiazepines. She is currently disoriented, forgetful and has a mild tremor. Unfortunately, she was unable to take oral medication, due to dysphagia, noted on 04/01/13. Managing with prn Haldol, for agitation. Head CT scan of 04/01/13, showed atrophy and chronic microvascular ischemia. No acute abnormality. To day, patient is a lot more calm,  cooperative, and is actually able to participate in conversation.  4. Hypothyroidism: On thyroxine replacement therapy.  5. Normocytic anemia: HB is stable/reasonable.  6. Gastroesophageal reflux disease; Not problematic on PPI. Marland Kitchen  7. Acute renal failure: Creatinine was 1.81 at presentation, BUN 30. Responded to iv fluids, and AKI has resolved. As of 04/02/13, creatinine is 1.13.  Resolved.  7. Left lower lobe infiltrate rule out aspiration pneumonia:  Initial CXR revealed atelectasis versus pneumonia, which was felt adequately covered with Cefepime. Follow up chest CT scan showed no consolidation. CXR of 04/01/13 showed curvilinear left lower lobe atelectasis only.  7. Elevated troponin/SVT/Chest pain: Patient had elevated troponin and transient SVT, in the initial days of hospitalization. Cardiology consultation was provided by Dr Nanetta Batty, who has opined that these findings were likely from demand ischemia in the setting of sepsis. Patient does have a known history of PAF/Atrial tachycardia. Currently on beta-blocker. Outpatient myocardial perfusion study recommended. On 04/02/13, patient as an episode of chest pressure, relieved with SL NTG x 2. No SOB. Cardiac were cycled enzymes again, and remained unelevated. Commenced on Nitropaste on 04/02/13, without recurrence of symptoms. .   8. HTN: BP significantly elevated on 04/02/13, but has improved with Nitropaste. Will change Metoprolol to 50 mg daily.    9. Dysphagia: Patient reportedly, developed dysphagia on 04/01/13. Evaluated by SLP today, and recommended D3 Diet. Patient is tolerating this well. .    Code Status: Full Code.  Family Communication: Discussed in detail, with family at bedside.  Disposition Plan: To be determined.    Brief narrative: Chelsea Lynch is an 79 y.o. female with a past medical history of hypothyroidism, depression, anxiety, and chronic pain who was admitted on 03/26/2013 with altered mental status, hypotension, and  septic shock from a urinary source.   Consultants:  Dr Nanetta Batty, cardiologist.   Procedures:  CXRs, Head CT scan, Chest CT scan.   Antibiotics:  Cefepime.   HPI/Subjective: Calm, more interactive.   Objective: Vital signs in last 24 hours: Temp:  [97.3 F (36.3 C)-98.4 F (36.9 C)] 98.2 F (36.8 C) (04/28 1318) Pulse Rate:  [96-130] 96 (04/28 1318) Resp:  [19-20] 20 (04/28 1318) BP: (144-173)/(77-101) 147/97 mmHg (04/28 1318) SpO2:  [97 %-99 %] 99 % (04/28 1318) Weight:  [59.5 kg (131 lb 2.8 oz)] 59.5 kg (131 lb 2.8 oz) (04/28 0539) Weight change: -0.4 kg (-14.1 oz) Last BM Date: 03/31/13  Intake/Output from previous day: 04/27 0701 - 04/28 0700 In: 120 [P.O.:120] Out: -      Physical Exam: General: Comfortable, alert, communicative, still disoriented, but improved. Not short of breath at rest.  HEENT:  Mild clinical pallor, no jaundice, no conjunctival injection or discharge. NECK:  Supple, JVP not seen, no carotid bruits, no palpable lymphadenopathy, no palpable goiter. CHEST:  Clinically clear to auscultation, no wheezes, no crackles. HEART:  Sounds 1 and 2 heard, normal, regular, no murmurs. ABDOMEN:  Full, soft, non-tender, no palpable organomegaly, no palpable masses. GENITALIA:  Not examined. LOWER EXTREMITIES:  No pitting edema, palpable peripheral pulses. MUSCULOSKELETAL SYSTEM:  Generalized osteoarthritic changes, otherwise, normal. CENTRAL NERVOUS SYSTEM:  Tremulous, otherwise, no focal neurologic deficit on gross examination.  Lab Results:  Recent Labs  04/01/13 1639 04/03/13 0530  WBC 11.1* 11.1*  HGB 11.4* 10.1*  HCT 32.1* 27.8*  PLT 287 262    Recent Labs  04/01/13 1639 04/03/13 0530  NA 135 135  K 5.3* 3.5  CL 105 104  CO2 19 20  GLUCOSE 91 96  BUN 12 9  CREATININE 1.13* 0.94  CALCIUM 9.4 9.0   Recent Results (from the past 240 hour(s))  CULTURE, BLOOD (ROUTINE X 2)     Status: None   Collection Time    03/25/13   8:30 PM      Result Value Range Status   Specimen Description BLOOD RIGHT ARM   Final   Special Requests BOTTLES DRAWN AEROBIC ONLY 10CC   Final   Culture  Setup Time     Final   Value: 03/26/2013 03:10     Two isolates with different morphologies were identified as the same organism.The most resistant organism was reported.   Culture     Final   Value: ESCHERICHIA COLI     Note: Gram Stain Report Called to,Read Back By and Verified With: TERESA CRITE 03/26/13 @ 6PM BY RUSCA.   Report Status 03/29/2013 FINAL   Final   Organism ID, Bacteria ESCHERICHIA COLI   Final  CULTURE, BLOOD (ROUTINE X 2)     Status: None   Collection Time    03/25/13  8:40 PM      Result Value Range Status   Specimen Description BLOOD RIGHT HAND   Final   Special Requests BOTTLES DRAWN AEROBIC AND ANAEROBIC 10CC EA   Final   Culture  Setup Time 03/26/2013 03:10   Final   Culture NO GROWTH 5 DAYS   Final   Report Status 04/01/2013 FINAL   Final  URINE CULTURE     Status: None   Collection Time    03/25/13 10:54 PM      Result Value Range Status   Specimen Description URINE, CATHETERIZED   Final   Special Requests ADDED 2313   Final   Culture  Setup Time 03/26/2013 13:21   Final  Colony Count 50,000 COLONIES/ML   Final   Culture ESCHERICHIA COLI   Final   Report Status 03/28/2013 FINAL   Final   Organism ID, Bacteria ESCHERICHIA COLI   Final  MRSA PCR SCREENING     Status: None   Collection Time    03/26/13  3:17 AM      Result Value Range Status   MRSA by PCR NEGATIVE  NEGATIVE Final   Comment:            The GeneXpert MRSA Assay (FDA     approved for NASAL specimens     only), is one component of a     comprehensive MRSA colonization     surveillance program. It is not     intended to diagnose MRSA     infection nor to guide or     monitor treatment for     MRSA infections.  CULTURE, BLOOD (ROUTINE X 2)     Status: None   Collection Time    03/30/13 11:30 AM      Result Value Range Status    Specimen Description BLOOD LEFT ARM   Final   Special Requests BOTTLES DRAWN AEROBIC AND ANAEROBIC 10CC   Final   Culture  Setup Time 03/30/2013 22:08   Final   Culture     Final   Value:        BLOOD CULTURE RECEIVED NO GROWTH TO DATE CULTURE WILL BE HELD FOR 5 DAYS BEFORE ISSUING A FINAL NEGATIVE REPORT   Report Status PENDING   Incomplete  CULTURE, BLOOD (ROUTINE X 2)     Status: None   Collection Time    03/30/13 11:35 AM      Result Value Range Status   Specimen Description BLOOD LEFT HAND   Final   Special Requests BOTTLES DRAWN AEROBIC AND ANAEROBIC 10CC   Final   Culture  Setup Time 03/30/2013 22:08   Final   Culture     Final   Value:        BLOOD CULTURE RECEIVED NO GROWTH TO DATE CULTURE WILL BE HELD FOR 5 DAYS BEFORE ISSUING A FINAL NEGATIVE REPORT   Report Status PENDING   Incomplete  CULTURE, BLOOD (ROUTINE X 2)     Status: None   Collection Time    04/01/13  5:11 PM      Result Value Range Status   Specimen Description BLOOD LEFT WRIST   Final   Special Requests BOTTLES DRAWN AEROBIC AND ANAEROBIC 10CC   Final   Culture  Setup Time 04/01/2013 21:28   Final   Culture     Final   Value:        BLOOD CULTURE RECEIVED NO GROWTH TO DATE CULTURE WILL BE HELD FOR 5 DAYS BEFORE ISSUING A FINAL NEGATIVE REPORT   Report Status PENDING   Incomplete  CULTURE, BLOOD (ROUTINE X 2)     Status: None   Collection Time    04/01/13  5:20 PM      Result Value Range Status   Specimen Description BLOOD RIGHT WRIST   Final   Special Requests BOTTLES DRAWN AEROBIC AND ANAEROBIC 10CC   Final   Culture  Setup Time 04/01/2013 21:29   Final   Culture     Final   Value:        BLOOD CULTURE RECEIVED NO GROWTH TO DATE CULTURE WILL BE HELD FOR 5 DAYS BEFORE ISSUING A FINAL NEGATIVE REPORT   Report Status PENDING  Incomplete     Studies/Results: Dg Chest Port 1 View  04/01/2013  *RADIOLOGY REPORT*  Clinical Data: Acute shortness of breath  PORTABLE CHEST - 1 VIEW  Comparison: 03/30/2013   Findings: Neural stimulator again noted.  Right IJ central line tip over mid to distal SVC.  Heart size normal.  Aortic ectasia again noted.  Curvilinear left lower lobe probable atelectasis noted. Right costophrenic angle is omitted from the field of view of the right lung is grossly clear. Evidence of resection or osteolysis of the distal right clavicle again noted.  IMPRESSION: Curvilinear left lower lobe atelectasis.   Original Report Authenticated By: Christiana Pellant, M.D.     Medications: Scheduled Meds: . aspirin  81 mg Oral Daily  . ceFEPime (MAXIPIME) IV  1 g Intravenous Q24H  . heparin  5,000 Units Subcutaneous Q8H  . levothyroxine  75 mcg Oral QAC breakfast  . metoprolol succinate  25 mg Oral Daily  . nitroGLYCERIN  0.5 inch Topical Q8H  . OxyCODONE  10 mg Oral Q12H  . pantoprazole (PROTONIX) IV  40 mg Intravenous Q2200  . senna-docusate  1 tablet Oral BID  . traZODone  100 mg Oral QHS  . venlafaxine XR  75 mg Oral BID   Continuous Infusions: . sodium chloride 1,000 mL (04/03/13 0119)   PRN Meds:.acetaminophen, acetaminophen, docusate sodium, haloperidol lactate, hydrALAZINE, morphine injection, nitroGLYCERIN, ondansetron (ZOFRAN) IV, oxyCODONE-acetaminophen, sodium chloride    LOS: 9 days   Cadell Gabrielson,CHRISTOPHER  [facility] Pager [phone]. If 8PM-8AM, please contact night-coverage at [email or web address], password [facility] 04/03/2013, 2:18 PM  LOS: 9 days

## 2013-04-04 LAB — BASIC METABOLIC PANEL
BUN: 8 mg/dL (ref 6–23)
Chloride: 105 mEq/L (ref 96–112)
Glucose, Bld: 102 mg/dL — ABNORMAL HIGH (ref 70–99)
Potassium: 3.4 mEq/L — ABNORMAL LOW (ref 3.5–5.1)

## 2013-04-04 MED ORDER — PANTOPRAZOLE SODIUM 40 MG PO TBEC
40.0000 mg | DELAYED_RELEASE_TABLET | Freq: Every day | ORAL | Status: DC
  Administered 2013-04-05 – 2013-04-06 (×2): 40 mg via ORAL
  Filled 2013-04-04 (×3): qty 1

## 2013-04-04 MED ORDER — ISOSORBIDE MONONITRATE ER 30 MG PO TB24
30.0000 mg | ORAL_TABLET | Freq: Every day | ORAL | Status: DC
  Administered 2013-04-04 – 2013-04-06 (×3): 30 mg via ORAL
  Filled 2013-04-04 (×3): qty 1

## 2013-04-04 MED ORDER — CEPHALEXIN 500 MG PO CAPS
500.0000 mg | ORAL_CAPSULE | Freq: Two times a day (BID) | ORAL | Status: DC
  Administered 2013-04-04 – 2013-04-06 (×5): 500 mg via ORAL
  Filled 2013-04-04 (×6): qty 1

## 2013-04-04 MED ORDER — POTASSIUM CHLORIDE CRYS ER 20 MEQ PO TBCR
40.0000 meq | EXTENDED_RELEASE_TABLET | Freq: Once | ORAL | Status: AC
  Administered 2013-04-04: 40 meq via ORAL
  Filled 2013-04-04: qty 2

## 2013-04-04 MED ORDER — BISACODYL 10 MG RE SUPP
10.0000 mg | Freq: Once | RECTAL | Status: AC
  Administered 2013-04-04: 10 mg via RECTAL
  Filled 2013-04-04: qty 1

## 2013-04-04 NOTE — Progress Notes (Signed)
ANTIBIOTIC CONSULT NOTE - Follow-up  Pharmacy Consult for Cefepime Indication: R/o UTI, severe sepsis  Allergies  Allergen Reactions  . Clarithromycin Other (See Comments)    Unknown   . Codeine Other (See Comments)    Unknown   . Penicillins Other (See Comments)    Unknown   . Tequin (Gatifloxacin) Other (See Comments)    Unknown     Patient Measurements: Height: 5\' 4"  (162.6 cm) Weight: 124 lb 9 oz (56.5 kg) IBW/kg (Calculated) : 54.7  Vital Signs: Temp src: Oral (04/29 0557) BP: 134/80 mmHg (04/29 1058) Pulse Rate: 91 (04/29 1058) Intake/Output from previous day: 04/28 0701 - 04/29 0700 In: 373 [P.O.:360; I.V.:13] Out: -  Intake/Output from this shift: Total I/O In: 200 [P.O.:200] Out: -   Labs:  Recent Labs  04/01/13 1639 04/03/13 0530 04/04/13 0500  WBC 11.1* 11.1*  --   HGB 11.4* 10.1*  --   PLT 287 262  --   CREATININE 1.13* 0.94 0.94   Estimated Creatinine Clearance: 37.1 ml/min (by C-G formula based on Cr of 0.94). No results found for this basename: VANCOTROUGH, VANCOPEAK, VANCORANDOM, GENTTROUGH, GENTPEAK, GENTRANDOM, TOBRATROUGH, TOBRAPEAK, TOBRARND, AMIKACINPEAK, AMIKACINTROU, AMIKACIN,  in the last 72 hours   Microbiology: Recent Results (from the past 720 hour(s))  CULTURE, BLOOD (ROUTINE X 2)     Status: None   Collection Time    03/25/13  8:30 PM      Result Value Range Status   Specimen Description BLOOD RIGHT ARM   Final   Special Requests BOTTLES DRAWN AEROBIC ONLY 10CC   Final   Culture  Setup Time     Final   Value: 03/26/2013 03:10     Two isolates with different morphologies were identified as the same organism.The most resistant organism was reported.   Culture     Final   Value: ESCHERICHIA COLI     Note: Gram Stain Report Called to,Read Back By and Verified With: TERESA CRITE 03/26/13 @ 6PM BY RUSCA.   Report Status 03/29/2013 FINAL   Final   Organism ID, Bacteria ESCHERICHIA COLI   Final  CULTURE, BLOOD (ROUTINE X 2)      Status: None   Collection Time    03/25/13  8:40 PM      Result Value Range Status   Specimen Description BLOOD RIGHT HAND   Final   Special Requests BOTTLES DRAWN AEROBIC AND ANAEROBIC 10CC EA   Final   Culture  Setup Time 03/26/2013 03:10   Final   Culture NO GROWTH 5 DAYS   Final   Report Status 04/01/2013 FINAL   Final  URINE CULTURE     Status: None   Collection Time    03/25/13 10:54 PM      Result Value Range Status   Specimen Description URINE, CATHETERIZED   Final   Special Requests ADDED 2313   Final   Culture  Setup Time 03/26/2013 13:21   Final   Colony Count 50,000 COLONIES/ML   Final   Culture ESCHERICHIA COLI   Final   Report Status 03/28/2013 FINAL   Final   Organism ID, Bacteria ESCHERICHIA COLI   Final  MRSA PCR SCREENING     Status: None   Collection Time    03/26/13  3:17 AM      Result Value Range Status   MRSA by PCR NEGATIVE  NEGATIVE Final   Comment:            The GeneXpert MRSA Assay (  FDA     approved for NASAL specimens     only), is one component of a     comprehensive MRSA colonization     surveillance program. It is not     intended to diagnose MRSA     infection nor to guide or     monitor treatment for     MRSA infections.  CULTURE, BLOOD (ROUTINE X 2)     Status: None   Collection Time    03/30/13 11:30 AM      Result Value Range Status   Specimen Description BLOOD LEFT ARM   Final   Special Requests BOTTLES DRAWN AEROBIC AND ANAEROBIC 10CC   Final   Culture  Setup Time 03/30/2013 22:08   Final   Culture     Final   Value:        BLOOD CULTURE RECEIVED NO GROWTH TO DATE CULTURE WILL BE HELD FOR 5 DAYS BEFORE ISSUING A FINAL NEGATIVE REPORT   Report Status PENDING   Incomplete  CULTURE, BLOOD (ROUTINE X 2)     Status: None   Collection Time    03/30/13 11:35 AM      Result Value Range Status   Specimen Description BLOOD LEFT HAND   Final   Special Requests BOTTLES DRAWN AEROBIC AND ANAEROBIC 10CC   Final   Culture  Setup Time  03/30/2013 22:08   Final   Culture     Final   Value:        BLOOD CULTURE RECEIVED NO GROWTH TO DATE CULTURE WILL BE HELD FOR 5 DAYS BEFORE ISSUING A FINAL NEGATIVE REPORT   Report Status PENDING   Incomplete  CULTURE, BLOOD (ROUTINE X 2)     Status: None   Collection Time    04/01/13  5:11 PM      Result Value Range Status   Specimen Description BLOOD LEFT WRIST   Final   Special Requests BOTTLES DRAWN AEROBIC AND ANAEROBIC 10CC   Final   Culture  Setup Time 04/01/2013 21:28   Final   Culture     Final   Value:        BLOOD CULTURE RECEIVED NO GROWTH TO DATE CULTURE WILL BE HELD FOR 5 DAYS BEFORE ISSUING A FINAL NEGATIVE REPORT   Report Status PENDING   Incomplete  CULTURE, BLOOD (ROUTINE X 2)     Status: None   Collection Time    04/01/13  5:20 PM      Result Value Range Status   Specimen Description BLOOD RIGHT WRIST   Final   Special Requests BOTTLES DRAWN AEROBIC AND ANAEROBIC 10CC   Final   Culture  Setup Time 04/01/2013 21:29   Final   Culture     Final   Value:        BLOOD CULTURE RECEIVED NO GROWTH TO DATE CULTURE WILL BE HELD FOR 5 DAYS BEFORE ISSUING A FINAL NEGATIVE REPORT   Report Status PENDING   Incomplete   Assessment: Day #9 of Cefepime 1g IV q24h in this 79 yo female who presented with altered mental status and was started on vancomycin + cefepime on 03/26/13. Vancomycin was discontinued on 4/22 based on cultures. Renal function has improved and is now stable. SCr 0.94,  CrCl ~ 37 ml/min.  1 or 2 blood cultures on 4/19 grew E. Coli sensitive to cefepime.  Afebrile. WBC 11.1K on 04/03/13.    Vanc 4/20>>4/22 Cefepime 4/20>>  4/19 blood x2 - Ecoli 1 of  2 cultures ; sensitive to cefepime (cephalosporins), cipro;  resistant to Amp, Unasyn, Septra 4/19 urine - Ecoli- sensitive to cefazolin, CTX, LVQ, Zosyn;  Resistant to Amp, septra 4/20 MRSA - NEG 4/24 Blood x2- NGTD 4/26 Blood x2- NGTD  Plan:  1. No change in dose of Cefepime need.  MD - consider de-escalating  therapy to oral ceftin is antibiotic to continue. Macrobid is contraindicated based on her renal function and age.  2. F/u renal fxn, C&S, clinical status and abx de-escalation  Chelsea Lynch, RPh Clinical Pharmacist Pager: [phone] 04/04/2013,12:20 PM

## 2013-04-04 NOTE — Progress Notes (Signed)
Physical Therapy Treatment Patient Details Name: Chelsea Lynch MRN: [id number] DOB: 1925/08/17 Today's Date: 04/04/2013 Time: 0840-0906 PT Time Calculation (min): 26 min  PT Assessment / Plan / Recommendation Comments on Treatment Session  Pt pleasently confused this am. Able to increase ambulation distance but pt had difficulty with safety and impulsivity. Instructed and manually assisted patient with performing ther ex in room. Will continue to see and progress activity as tolerated.    Follow Up Recommendations  Home health PT;Supervision/Assistance - 24 hour     Does the patient have the potential to tolerate intense rehabilitation     Barriers to Discharge        Equipment Recommendations  Other (comment) (4 wheeled RW)    Recommendations for Other Services OT consult  Frequency Min 3X/week   Plan Discharge plan remains appropriate    Precautions / Restrictions Precautions Precautions: Fall Precaution Comments: confused Restrictions Weight Bearing Restrictions: No   Pertinent Vitals/Pain No pain at this time    Mobility  Bed Mobility Bed Mobility: Supine to Sit;Sitting - Scoot to Edge of Bed Supine to Sit: 4: Min assist Sitting - Scoot to [place] of Bed: 4: Min assist Details for Bed Mobility Assistance: VC for for calming movements as pt very impulsive Transfers Transfers: Sit to Stand;Stand to Sit Sit to Stand: 4: Min assist;With upper extremity assist;From chair/3-in-1 Stand to Sit: 4: Min assist;With upper extremity assist;To chair/3-in-1 Details for Transfer Assistance: Verbal and tactile cues for hand placement  Ambulation/Gait Ambulation/Gait Assistance: 4: Min assist Ambulation Distance (Feet): 120 Feet Assistive device: Rolling walker Ambulation/Gait Assistance Details: pt with extreme difficulty focusing to task and controlling assistive device. Max verbal and tactile cues for safety. Gait Pattern: Step-through pattern;Decreased stride  length;Shuffle;Trunk flexed;Narrow base of support Gait velocity: decreased General Gait Details: unsafe ambulation    Exercises General Exercises - Lower Extremity Ankle Circles/Pumps: AROM;Both;10 reps Long Arc Quad: AROM;Both;10 reps Hip Flexion/Marching: AROM;Both;10 reps     PT Goals Acute Rehab PT Goals PT Goal Formulation: With patient/family Time For Goal Achievement: 04/03/13 Potential to Achieve Goals: Good Pt will go Supine/Side to Sit: with supervision;with HOB 0 degrees PT Goal: Supine/Side to Sit - Progress: Progressing toward goal Pt will go Sit to Supine/Side: with supervision;with HOB 0 degrees PT Goal: Sit to Supine/Side - Progress: Progressing toward goal Pt will go Sit to Stand: with supervision;with cues (comment type and amount) PT Goal: Sit to Stand - Progress: Progressing toward goal Pt will go Stand to Sit: with supervision;with cues (comment type and amount) PT Goal: Stand to Sit - Progress: Progressing toward goal Pt will Ambulate: 16 - 50 feet;with supervision;with least restrictive assistive device PT Goal: Ambulate - Progress: Progressing toward goal  Visit Information  Last PT Received On: 04/04/13 Assistance Needed: +1    Subjective Data  Subjective: I didn't sleep much Patient Stated Goal: to go home   Cognition  Cognition Arousal/Alertness: Lethargic Behavior During Therapy: Flat affect Overall Cognitive Status: Impaired/Different from baseline Area of Impairment: Orientation;Attention;Memory;Following commands;Safety/judgement;Awareness;Problem solving Orientation Level: Disoriented to;Place;Time;Situation Current Attention Level: Focused Memory: Decreased recall of precautions Following Commands: Follows one step commands inconsistently Safety/Judgement: Decreased awareness of safety;Decreased awareness of deficits Awareness: Intellectual Problem Solving: Slow processing;Difficulty sequencing;Requires verbal cues General Comments: PT  once again very confused this am. unsafe with ambulation. high fall risk    Balance  Balance Balance Assessed: Yes Static Standing Balance Static Standing - Balance Support: Bilateral upper extremity supported Static Standing - Level of Assistance: 4:  Min assist Static Standing - Comment/# of Minutes: 3 mintues during rest breaks High Level Balance High Level Balance Activites: Direction changes;Head turns (patient very unsteady and unsafe; unable to control rw)  End of Session PT - End of Session Equipment Utilized During Treatment: Gait belt Activity Tolerance: Patient limited by fatigue Patient left: in chair;with call bell/phone within reach;with family/visitor present Nurse Communication: Mobility status   GP     Fabio Asa 04/04/2013, 10:53 AM Charlotte Crumb, PT DPT  [phone]

## 2013-04-04 NOTE — Progress Notes (Signed)
Occupational Therapy Treatment Patient Details Name: Chelsea Lynch MRN: [id number] DOB: 20-Sep-1925 Today's Date: 04/04/2013 Time: 4098-1191 OT Time Calculation (min): 28 min  OT Assessment / Plan / Recommendation Comments on Treatment Session On entrance to room, pt trying to get OOB independently. Caregiver had been in room and stepped out. Discussed D/C concerns with family. Stressed that pt needs close 24/7 direct S - that she can not be left alone at all because she is an extremely high fall risk.. Feel that pt will be better off if she D/C s home with 24/7 S as it is a familiar environment to her and her husband is also there. However, the husband can not provide level of care needed because he is elderly and uses a RW himself. If pt D/C home, she will need HHOT/PT.    Follow Up Recommendations  Home health OT;Supervision/Assistance - 24 hour (direct S)    Barriers to Discharge   need 24/7 direct assistance    Equipment Recommendations  None recommended by OT    Recommendations for Other Services    Frequency Min 2X/week   Plan Discharge plan needs to be updated    Precautions / Restrictions Precautions Precautions: Fall Precaution Comments: confused   Pertinent Vitals/Pain no apparent distress     ADL  ADL Comments: Pt requires mod redirction during functional ambulation. Pt running RW into walls and required manual assist to [facility] RW correctly as to not fall.     OT Diagnosis:    OT Problem List:   OT Treatment Interventions:     OT Goals Acute Rehab OT Goals OT Goal Formulation: With patient/family Time For Goal Achievement: 04/11/13 Potential to Achieve Goals: Good ADL Goals Pt Will Perform Grooming: with supervision;Supported;Standing at sink ADL Goal: Grooming - Progress: Progressing toward goals Pt Will Perform Lower Body Bathing: with supervision;Sitting, chair;Sitting in shower ADL Goal: Lower Body Bathing - Progress: Progressing toward goals Pt Will  Perform Lower Body Dressing: with supervision;Sit to stand from chair;Other (comment) ADL Goal: Lower Body Dressing - Progress: Progressing toward goals Pt Will Perform Tub/Shower Transfer: with supervision;Shower transfer;Shower seat with back ADL Goal: Web designer - Progress: Progressing toward goals Additional ADL Goal #1: Pt will complete all toileting with S on high commode. ADL Goal: Additional Goal #1 - Progress: Progressing toward goals  Visit Information  Last OT Received On: 04/04/13 Assistance Needed: +1    Subjective Data      Prior Functioning       Cognition  Cognition Arousal/Alertness: Lethargic Behavior During Therapy: Restless Overall Cognitive Status: Impaired/Different from baseline Area of Impairment: Orientation;Attention;Memory;Following commands;Safety/judgement;Awareness;Problem solving Orientation Level: Disoriented to;Time;Place Current Attention Level: Focused Memory: Decreased recall of precautions Following Commands: Follows one step commands inconsistently Safety/Judgement: Decreased awareness of safety;Decreased awareness of deficits Awareness: Intellectual Problem Solving: Slow processing;Decreased initiation;Difficulty sequencing;Requires verbal cues;Requires tactile cues    Mobility  Transfers Transfers: Sit to Stand;Stand to Sit Sit to Stand: 4: Min assist;With upper extremity assist;From chair/3-in-1 Stand to Sit: 4: Min assist;With upper extremity assist;To chair/3-in-1    Exercises      Balance  min A   End of Session OT - End of Session Activity Tolerance: Treatment limited secondary to agitation;Other (comment) (confused) Patient left: in chair;with call bell/phone within reach;with family/visitor present Nurse Communication: Mobility status  GO     Niv Darley,HILLARY 04/04/2013, 5:36 PM [facility] [place], OTR/L  [phone] 04/04/2013

## 2013-04-04 NOTE — Progress Notes (Signed)
[facility] PROGRESS NOTE  Chelsea Lynch:[id number] DOB: [birth year] DOA: 03/25/2013 PCP: [name], MD  Assessment/Plan: Principal Problem:   Septic shock(785.52) Active Problems:   BACK PAIN, CHRONIC- on chronic narcotics at home   Urosepsis   Bacteremia due to Gram-negative bacteria   Aspiration pneumonia   Acute on chronic renal insufficiency- resolved   Altered mental status- suspected to be secondary to sepsis and narcotic withdrawl   Unspecified hypothyroidism   Normocytic anemia   GERD (gastroesophageal reflux disease)   Infiltrate noted on imaging study   Elevated troponin- felt to be secondary to SVT   SVT (supraventricular tachycardia)   Septic encephalopathy   Acute delirium   Opiate withdrawal   Dysphagia, unspecified    1. Septic shock(785.52)/urosepsis/bacteremia due to gram-negative bacteria:  Patient presented with altered mental status, hypotension, and clinical findings, consistent with septic shock. Septic work up revealed UTI, gram-negative rod bacteremia and pansensitive Escherichia coli on urine culture. Patient was managed with appropriate antibiotics, if Cephalosporin, with satisfactory clinical response. She is afebrile, and wcc has trended down nicely. Planning a 14-day course of antibiotics to be concluded on 04/07/13. Clinically, sepsis has resolved. Switched to oral Keflex today.  2. Hypokalemia: Repleted as indicated. Resolved.  3. Altered mental status/Encephalopathy: Patient has had altered mental status during her hospitalization, which is multi-factorial, secondary to known infection, narcotics withdrawal, and possibly benzodiazepines, associated with disorientation, forgetfulness and a mild tremor. Managed with prn Haldol, for agitation. Head CT scan of 04/01/13, showed atrophy and chronic microvascular ischemia. No acute abnormality. As of 04/03/13, patient's mental status has improved dramatically, and she is now more  lucid/interactive. Further improvement is anticipated.  4. Hypothyroidism: On thyroxine replacement therapy.  5. Normocytic anemia: HB is stable/reasonable.  6. Gastroesophageal reflux disease; Not problematic on PPI.   7. Acute renal failure: Creatinine was 1.81 at presentation, BUN 30. Responded to iv fluids, and AKI has resolved. As of 04/02/13, creatinine is 1.13.  7. Left lower lobe infiltrate rule out aspiration pneumonia:  Initial CXR revealed atelectasis versus pneumonia, which was felt adequately covered with Cefepime. Follow up chest CT scan showed no consolidation. CXR of 04/01/13 showed curvilinear left lower lobe atelectasis only.  7. Elevated troponin/SVT/Chest pain: Patient had elevated troponin and transient SVT, in the initial days of hospitalization. Cardiology consultation was provided by Dr Nanetta Batty, who has opined that these findings were likely from demand ischemia in the setting of sepsis. Patient does have a known history of PAF/Atrial tachycardia. Currently on beta-blocker. Outpatient myocardial perfusion study recommended. On 04/02/13, patient as an episode of chest pressure, relieved with SL NTG x 2. No SOB. Cardiac were cycled enzymes again, and remained unelevated. Commenced on Nitropaste on 04/02/13, without recurrence of symptoms. Have switched to Imdur today. Patient will follow up with Dr Nanetta Batty, on discharge.  8. HTN: BP significantly elevated on 04/02/13, improved with NTG and a change in Metoprolol to 50 mg daily on 04/03/13. Now controlled.    9. Dysphagia: Patient reportedly, developed dysphagia on 04/01/13. Evaluated by SLP on 04/03/13, and recommended D3 Diet. Patient is tolerating this well. .    Code Status: Full Code.  Family Communication: Discussed in detail, with family at bedside.  Disposition Plan: To be determined. Patient has been evaluated by PT?OT, and recommended HHPT/24-hr supervision. Aiming discharge on 04/05/13 or 04/06/13.    Brief  narrative: Chelsea Lynch is an 79 y.o. female with a past medical history of hypothyroidism, depression, anxiety, and  chronic pain who was admitted on 03/26/2013 with altered mental status, hypotension, and septic shock from a urinary source.   Consultants:  Dr Nanetta Batty, cardiologist.   Procedures:  CXRs, Head CT scan, Chest CT scan.   Antibiotics:  Cefepime.   HPI/Subjective: Calm, interactive. Constipated.   Objective: Vital signs in last 24 hours: Temp:  [97.9 F (36.6 C)-98.2 F (36.8 C)] 97.9 F (36.6 C) (04/28 2112) Pulse Rate:  [85-98] 91 (04/29 1058) Resp:  [19-20] 19 (04/29 0557) BP: (94-182)/(72-97) 134/80 mmHg (04/29 1058) SpO2:  [99 %] 99 % (04/28 2112) Weight:  [56.5 kg (124 lb 9 oz)] 56.5 kg (124 lb 9 oz) (04/29 0557) Weight change: -3 kg (-6 lb 9.8 oz) Last BM Date: 03/31/13  Intake/Output from previous day: 04/28 0701 - 04/29 0700 In: 373 [P.O.:360; I.V.:13] Out: -  Total I/O In: 200 [P.O.:200] Out: -    Physical Exam: General: Comfortable, alert, communicative, still disoriented, but improved. Not short of breath at rest.  HEENT:  Mild clinical pallor, no jaundice, no conjunctival injection or discharge. NECK:  Supple, JVP not seen, no carotid bruits, no palpable lymphadenopathy, no palpable goiter. CHEST:  Clinically clear to auscultation, no wheezes, no crackles. HEART:  Sounds 1 and 2 heard, normal, regular, no murmurs. ABDOMEN:  Full, soft, non-tender, no palpable organomegaly, no palpable masses. GENITALIA:  Not examined. LOWER EXTREMITIES:  No pitting edema, palpable peripheral pulses. MUSCULOSKELETAL SYSTEM:  Generalized osteoarthritic changes, otherwise, normal. CENTRAL NERVOUS SYSTEM:  Tremulous, otherwise, no focal neurologic deficit on gross examination.  Lab Results:  Recent Labs  04/01/13 1639 04/03/13 0530  WBC 11.1* 11.1*  HGB 11.4* 10.1*  HCT 32.1* 27.8*  PLT 287 262    Recent Labs  04/03/13 0530 04/04/13 0500   NA 135 137  K 3.5 3.4*  CL 104 105  CO2 20 21  GLUCOSE 96 102*  BUN 9 8  CREATININE 0.94 0.94  CALCIUM 9.0 9.2   Recent Results (from the past 240 hour(s))  CULTURE, BLOOD (ROUTINE X 2)     Status: None   Collection Time    03/25/13  8:30 PM      Result Value Range Status   Specimen Description BLOOD RIGHT ARM   Final   Special Requests BOTTLES DRAWN AEROBIC ONLY 10CC   Final   Culture  Setup Time     Final   Value: 03/26/2013 03:10     Two isolates with different morphologies were identified as the same organism.The most resistant organism was reported.   Culture     Final   Value: ESCHERICHIA COLI     Note: Gram Stain Report Called to,Read Back By and Verified With: TERESA CRITE 03/26/13 @ 6PM BY RUSCA.   Report Status 03/29/2013 FINAL   Final   Organism ID, Bacteria ESCHERICHIA COLI   Final  CULTURE, BLOOD (ROUTINE X 2)     Status: None   Collection Time    03/25/13  8:40 PM      Result Value Range Status   Specimen Description BLOOD RIGHT HAND   Final   Special Requests BOTTLES DRAWN AEROBIC AND ANAEROBIC 10CC EA   Final   Culture  Setup Time 03/26/2013 03:10   Final   Culture NO GROWTH 5 DAYS   Final   Report Status 04/01/2013 FINAL   Final  URINE CULTURE     Status: None   Collection Time    03/25/13 10:54 PM      Result Value  Range Status   Specimen Description URINE, CATHETERIZED   Final   Special Requests ADDED 2313   Final   Culture  Setup Time 03/26/2013 13:21   Final   Colony Count 50,000 COLONIES/ML   Final   Culture ESCHERICHIA COLI   Final   Report Status 03/28/2013 FINAL   Final   Organism ID, Bacteria ESCHERICHIA COLI   Final  MRSA PCR SCREENING     Status: None   Collection Time    03/26/13  3:17 AM      Result Value Range Status   MRSA by PCR NEGATIVE  NEGATIVE Final   Comment:            The GeneXpert MRSA Assay (FDA     approved for NASAL specimens     only), is one component of a     comprehensive MRSA colonization     surveillance program.  It is not     intended to diagnose MRSA     infection nor to guide or     monitor treatment for     MRSA infections.  CULTURE, BLOOD (ROUTINE X 2)     Status: None   Collection Time    03/30/13 11:30 AM      Result Value Range Status   Specimen Description BLOOD LEFT ARM   Final   Special Requests BOTTLES DRAWN AEROBIC AND ANAEROBIC 10CC   Final   Culture  Setup Time 03/30/2013 22:08   Final   Culture     Final   Value:        BLOOD CULTURE RECEIVED NO GROWTH TO DATE CULTURE WILL BE HELD FOR 5 DAYS BEFORE ISSUING A FINAL NEGATIVE REPORT   Report Status PENDING   Incomplete  CULTURE, BLOOD (ROUTINE X 2)     Status: None   Collection Time    03/30/13 11:35 AM      Result Value Range Status   Specimen Description BLOOD LEFT HAND   Final   Special Requests BOTTLES DRAWN AEROBIC AND ANAEROBIC 10CC   Final   Culture  Setup Time 03/30/2013 22:08   Final   Culture     Final   Value:        BLOOD CULTURE RECEIVED NO GROWTH TO DATE CULTURE WILL BE HELD FOR 5 DAYS BEFORE ISSUING A FINAL NEGATIVE REPORT   Report Status PENDING   Incomplete  CULTURE, BLOOD (ROUTINE X 2)     Status: None   Collection Time    04/01/13  5:11 PM      Result Value Range Status   Specimen Description BLOOD LEFT WRIST   Final   Special Requests BOTTLES DRAWN AEROBIC AND ANAEROBIC 10CC   Final   Culture  Setup Time 04/01/2013 21:28   Final   Culture     Final   Value:        BLOOD CULTURE RECEIVED NO GROWTH TO DATE CULTURE WILL BE HELD FOR 5 DAYS BEFORE ISSUING A FINAL NEGATIVE REPORT   Report Status PENDING   Incomplete  CULTURE, BLOOD (ROUTINE X 2)     Status: None   Collection Time    04/01/13  5:20 PM      Result Value Range Status   Specimen Description BLOOD RIGHT WRIST   Final   Special Requests BOTTLES DRAWN AEROBIC AND ANAEROBIC 10CC   Final   Culture  Setup Time 04/01/2013 21:29   Final   Culture     Final  Value:        BLOOD CULTURE RECEIVED NO GROWTH TO DATE CULTURE WILL BE HELD FOR 5 DAYS BEFORE  ISSUING A FINAL NEGATIVE REPORT   Report Status PENDING   Incomplete     Studies/Results: No results found.  Medications: Scheduled Meds: . aspirin  81 mg Oral Daily  . ceFEPime (MAXIPIME) IV  1 g Intravenous Q24H  . heparin  5,000 Units Subcutaneous Q8H  . levothyroxine  75 mcg Oral QAC breakfast  . metoprolol succinate  50 mg Oral Daily  . nitroGLYCERIN  0.5 inch Topical Q8H  . OxyCODONE  10 mg Oral Q12H  . pantoprazole (PROTONIX) IV  40 mg Intravenous Q2200  . potassium chloride  40 mEq Oral Once  . senna-docusate  1 tablet Oral BID  . traZODone  100 mg Oral QHS  . venlafaxine XR  75 mg Oral BID   Continuous Infusions: . sodium chloride 100 mL/hr (04/04/13 1132)   PRN Meds:.acetaminophen, acetaminophen, docusate sodium, haloperidol lactate, hydrALAZINE, morphine injection, nitroGLYCERIN, ondansetron (ZOFRAN) IV, oxyCODONE-acetaminophen, sodium chloride    LOS: 10 days   Chrystie Hagwood,CHRISTOPHER  [facility] Pager [phone]. If 8PM-8AM, please contact night-coverage at [email or web address], password [facility] 04/04/2013, 12:37 PM  LOS: 10 days

## 2013-04-05 LAB — CULTURE, BLOOD (ROUTINE X 2)

## 2013-04-05 LAB — BASIC METABOLIC PANEL
BUN: 9 mg/dL (ref 6–23)
CO2: 22 mEq/L (ref 19–32)
Calcium: 9.4 mg/dL (ref 8.4–10.5)
Chloride: 104 mEq/L (ref 96–112)
Creatinine, Ser: 1.07 mg/dL (ref 0.50–1.10)
GFR calc Af Amer: 53 mL/min — ABNORMAL LOW (ref >90)
GFR calc non Af Amer: 46 mL/min — ABNORMAL LOW (ref >90)
Glucose, Bld: 93 mg/dL (ref 70–99)
Potassium: 3.8 mEq/L (ref 3.5–5.1)
Sodium: 136 mEq/L (ref 135–145)

## 2013-04-05 NOTE — Progress Notes (Signed)
Speech Language Pathology Dysphagia Treatment Patient Details Name: Chelsea Lynch MRN: [id number] DOB: 02-14-1925 Today's Date: 04/05/2013 Time: 4098-1191 SLP Time Calculation (min): 12 min  Assessment / Plan / Recommendation Clinical Impression  Dysphagia treatment involved education with pt. and daughter and clinical observation with po's.  Daughter reports pt.'s appetite has been poor in the hospital.  Pt. scheduled to discharge home tomorrow and daughter state they will prepare foods she likes in hopes to increase appetite.  Pt. consumed thin water via straw without s/s aspiration and min verbal cues for small sips.  Educated pt.  and daughter on potential future challenges related to swallow fucntion including drinking from cup versus straws and placing pill whole in applesauce/pudding.  Continue Dys 3 and thin liquid (she politely refused cracker to evaluate possible upgrade to regular texture).  No f/u needed.     Diet Recommendation  Continue with Current Diet: Dysphagia 3 (mechanical soft);Thin liquid    SLP Plan All goals met   Pertinent Vitals/Pain none   Swallowing Goals  SLP Swallowing Goals Swallow Study Goal #2 - Progress: Met  General Temperature Spikes Noted: No Respiratory Status: Room air Behavior/Cognition: Alert;Cooperative;Pleasant mood;Requires cueing Oral Cavity - Dentition: Adequate natural dentition Patient Positioning: Upright in bed  Oral Cavity - Oral Hygiene Does patient have any of the following "at risk" factors?: Nutritional status - inadequate Brush patient's teeth BID with toothbrush (using toothpaste with fluoride): Yes Patient is HIGH RISK - Oral Care Protocol followed (see row info): Yes Patient is AT RISK - Oral Care Protocol followed (see row info): Yes   Dysphagia Treatment Treatment focused on: Skilled observation of diet tolerance;Patient/family/caregiver education Family/Caregiver Educated: daughter Treatment Methods/Modalities: Skilled  observation Patient observed directly with PO's: Yes Type of PO's observed: Thin liquids Feeding: Able to feed self;Needs assist;Needs set up Liquids provided via: Straw Amount of cueing: Minimal   GO     Chelsea Lynch M.Ed [employer] [phone]  04/05/2013

## 2013-04-05 NOTE — Progress Notes (Signed)
Occupational Therapy Treatment Patient Details Name: Chelsea Lynch MRN: [id number] DOB: [birth year] Today's Date: 04/05/2013 Time: 2130-8657 OT Time Calculation (min): 23 min  OT Assessment / Plan / Recommendation Comments on Treatment Session Pt with improved performance today. Discussed importance of 24/7 S after D/C with goal oriented activities. Redirecting pt as needed. Encourage activity throughout the day. Family verbalized understanding.    Follow Up Recommendations  Home health OT    Barriers to Discharge       Equipment Recommendations  None recommended by OT    Recommendations for Other Services    Frequency Min 2X/week   Plan Discharge plan remains appropriate    Precautions / Restrictions Precautions Precautions: Fall Precaution Comments: confused   Pertinent Vitals/Pain no apparent distress     ADL  Upper Body Dressing: Minimal assistance Where Assessed - Upper Body Dressing: Unsupported sitting Lower Body Dressing: Minimal assistance Where Assessed - Lower Body Dressing: Supported sit to stand ADL Comments: Focus of session of functional mobility and family education in preparation for D/C.     OT Diagnosis:    OT Problem List:   OT Treatment Interventions:     OT Goals Acute Rehab OT Goals OT Goal Formulation: With patient/family Time For Goal Achievement: 04/11/13 Potential to Achieve Goals: Good ADL Goals Pt Will Perform Grooming: with supervision;Supported;Standing at sink ADL Goal: Grooming - Progress: Progressing toward goals Pt Will Perform Lower Body Bathing: with supervision;Sitting, chair;Sitting in shower ADL Goal: Lower Body Bathing - Progress: Progressing toward goals Pt Will Perform Lower Body Dressing: with supervision;Sit to stand from chair;Other (comment) ADL Goal: Lower Body Dressing - Progress: Progressing toward goals Pt Will Perform Tub/Shower Transfer: with supervision;Shower transfer;Shower seat with back ADL Goal: Glass blower/designer - Progress: Progressing toward goals Additional ADL Goal #1: Pt will complete all toileting with S on high commode. ADL Goal: Additional Goal #1 - Progress: Progressing toward goals  Visit Information  Last OT Received On: 04/05/13 Assistance Needed: +1    Subjective Data      Prior Functioning       Cognition  Cognition Arousal/Alertness: Awake/alert Behavior During Therapy: WFL for tasks assessed/performed Overall Cognitive Status: Impaired/Different from baseline Area of Impairment: Orientation;Attention Orientation Level: Disoriented to;Time;Place Current Attention Level: Sustained Memory: Decreased recall of precautions Following Commands: Follows one step commands consistently Safety/Judgement: Decreased awareness of safety;Decreased awareness of deficits Awareness: Emergent Problem Solving: Slow processing;Requires verbal cues General Comments: Improved cognition today. Family also notes improvement    Mobility  Bed Mobility Bed Mobility: Supine to Sit Supine to Sit: 4: Min assist Sitting - Scoot to Edge of Bed: 4: Min assist Sit to Supine: 4: Min assist;HOB flat Details for Bed Mobility Assistance: VCs for hand placement and encouragement to participate Transfers Transfers: Sit to Stand;Stand to Sit Sit to Stand: 4: Min assist;From bed Stand to Sit: 4: Min assist;To chair/3-in-1 Details for Transfer Assistance: vc for safety    Exercises      Balance Balance Balance Assessed:  (imporved awreness of midline) Static Standing Balance Static Standing - Balance Support: Bilateral upper extremity supported Static Standing - Level of Assistance: 4: Min assist Static Standing - Comment/# of Minutes: 2 minutes talking to husband in hall Dynamic Standing Balance Dynamic Standing - Balance Support: Bilateral upper extremity supported Dynamic Standing - Level of Assistance: 4: Min assist   End of Session OT - End of Session Activity Tolerance: Patient  tolerated treatment well Patient left: in chair;with call bell/phone within reach;with  family/visitor present Nurse Communication: Mobility status  GO     Chelsea Lynch,Chelsea Lynch 04/05/2013, 3:34 PM [facility], OTR/L  [phone] 04/05/2013

## 2013-04-05 NOTE — Progress Notes (Signed)
Physical Therapy Treatment Patient Details Name: Chelsea Lynch MRN: [id number] DOB: 07/04/1925 Today's Date: 04/05/2013 Time: 1308-6578 PT Time Calculation (min): 23 min  PT Assessment / Plan / Recommendation Comments on Treatment Session  Pt making some improvements but still requiring minimal assist during mobility. Patient cognition appears to be clearing modestly and patient is demonstrating more of her personality per family members.  Pt will benefit from HHPT and 24/7 supervision assist upon discharge.    Follow Up Recommendations  Home health PT;Supervision/Assistance - 24 hour     Does the patient have the potential to tolerate intense rehabilitation     Barriers to Discharge        Equipment Recommendations  Other (comment) (4 wheeled RW)    Recommendations for Other Services OT consult  Frequency Min 3X/week   Plan Discharge plan remains appropriate    Precautions / Restrictions Precautions Precautions: Fall Precaution Comments: confused (clearing)   Pertinent Vitals/Pain No pain at this time    Mobility  Bed Mobility Bed Mobility: Supine to Sit;Sitting - Scoot to Edge of Bed Supine to Sit: 4: Min assist Sitting - Scoot to [place] of Bed: 4: Min assist Sit to Supine: 4: Min assist;HOB flat Details for Bed Mobility Assistance: VCs for hand placement and encouragement to participate Transfers Transfers: Sit to Stand;Stand to Sit Sit to Stand: 4: Min assist;With upper extremity assist;From chair/3-in-1 Stand to Sit: 4: Min assist;With upper extremity assist;To chair/3-in-1 Details for Transfer Assistance: Verbal and tactile cues for hand placement  Ambulation/Gait Ambulation/Gait Assistance: 4: Min assist Ambulation Distance (Feet): 160 Feet Assistive device: Rolling walker Ambulation/Gait Assistance Details: assist to control device; VCs for positioning and use of rw as well as to direct to task; patient also ambulated with 2 person hand held assist Gait Pattern:  Step-through pattern;Decreased stride length;Shuffle;Trunk flexed;Narrow base of support Gait velocity: decreased General Gait Details: unsafe ambulation Stairs: No Wheelchair Mobility Wheelchair Mobility: No      PT Goals Acute Rehab PT Goals PT Goal Formulation: With patient/family Time For Goal Achievement: 04/03/13 Potential to Achieve Goals: Good Pt will go Supine/Side to Sit: with supervision;with HOB 0 degrees PT Goal: Supine/Side to Sit - Progress: Progressing toward goal Pt will go Sit to Supine/Side: with supervision;with HOB 0 degrees PT Goal: Sit to Supine/Side - Progress: Progressing toward goal Pt will go Sit to Stand: with supervision;with cues (comment type and amount) PT Goal: Sit to Stand - Progress: Progressing toward goal Pt will go Stand to Sit: with supervision;with cues (comment type and amount) PT Goal: Stand to Sit - Progress: Progressing toward goal Pt will Ambulate: 16 - 50 feet;with supervision;with least restrictive assistive device PT Goal: Ambulate - Progress: Progressing toward goal  Visit Information  Last PT Received On: 04/05/13 Assistance Needed: +1    Subjective Data  Subjective: My [name] is coming in a little bit (IO:[id number]) Patient Stated Goal: to go home   Cognition  Cognition Arousal/Alertness: Awake/alert Overall Cognitive Status: Impaired/Different from baseline Area of Impairment: Orientation;Attention;Memory;Following commands;Safety/judgement;Awareness;Problem solving Orientation Level: Disoriented to;Time;Place Current Attention Level: Sustained Memory: Decreased recall of precautions Following Commands: Follows one step commands inconsistently Safety/Judgement: Decreased awareness of safety;Decreased awareness of deficits Awareness: Intellectual Problem Solving: Slow processing;Decreased initiation;Difficulty sequencing;Requires verbal cues;Requires tactile cues General Comments: Pt cognition begining to clear     Balance  Balance Balance Assessed: Yes Static Standing Balance Static Standing - Balance Support: Bilateral upper extremity supported Static Standing - Level of Assistance: 4: Min assist Static Standing -  Comment/# of Minutes: 2 minutes talking to husband in hall Dynamic Standing Balance Dynamic Standing - Balance Support: Bilateral upper extremity supported Dynamic Standing - Level of Assistance: 4: Min assist  End of Session PT - End of Session Equipment Utilized During Treatment: Gait belt Activity Tolerance: Patient tolerated treatment well Patient left: in chair;with call bell/phone within reach;with family/visitor present Nurse Communication: Mobility status   GP     Fabio Asa 04/05/2013, 3:31 PM Charlotte Crumb, PT DPT  [phone]

## 2013-04-05 NOTE — Progress Notes (Signed)
[facility] PROGRESS NOTE  Chelsea Lynch RUE:[id number] DOB: [birth year] DOA: 03/25/2013 PCP: [name], MD  Assessment/Plan: 1. Septic shock(785.52)/urosepsis/bacteremia due to gram-negative bacteria: Patient presented with altered mental status, hypotension, and clinical findings, consistent with septic shock. Septic work up revealed UTI, gram-negative rod bacteremia and pansensitive Escherichia coli on urine culture. Patient was managed with appropriate antibiotics, Cephalosporin, with satisfactory clinical response. She is afebrile, and wbc has trended down nicely. Planning a 14-day course of antibiotics to be concluded on 04/07/13. Clinically, sepsis has resolved. Switched to oral Keflex   2. Hypokalemia: Repleted as indicated. Resolved.   3. Altered mental status/Encephalopathy: Patient has had altered mental status during her hospitalization, which is multi-factorial, secondary to known infection, narcotics withdrawal, and possibly benzodiazepines, associated with disorientation, forgetfulness and a mild tremor. Managed with prn Haldol, for agitation. Head CT scan of 04/01/13, showed atrophy and chronic microvascular ischemia. No acute abnormality. As of 04/03/13, patient's mental status has improved dramatically, and she is now more lucid/interactive. Further improvement is anticipated.   4. Hypothyroidism: On thyroxine replacement therapy.   5. Normocytic anemia: HGB is stable/reasonable.   6. Gastroesophageal reflux disease; Not problematic on PPI.   7. Acute renal failure: Creatinine was 1.81 at presentation, BUN 30. Responded to iv fluids, and AKI has resolved.  7. Left lower lobe infiltrate rule out aspiration pneumonia: Initial CXR revealed atelectasis versus pneumonia, which was felt adequately covered with Cefepime. Follow up chest CT scan showed no consolidation. CXR of 04/01/13 showed curvilinear left lower lobe atelectasis only.   7. Elevated troponin/SVT/Chest  pain: Patient had elevated troponin and transient SVT, in the initial days of hospitalization. Cardiology consultation was provided by Dr Nanetta Batty, who has opined that these findings were likely from demand ischemia in the setting of sepsis. Patient does have a known history of PAF/Atrial tachycardia. Currently on beta-blocker. Outpatient myocardial perfusion study recommended. On 04/02/13, patient as an episode of chest pressure, relieved with SL NTG x 2. No SOB. Cardiac were cycled enzymes again, and remained unelevated. Commenced on Nitropaste on 04/02/13, without recurrence of symptoms. Have switched to Imdur today. Patient will follow up with Dr Nanetta Batty, on discharge.   8. HTN: BP significantly elevated on 04/02/13, improved with NTG and a change in Metoprolol to 50 mg daily on 04/03/13. Now controlled.   9. Dysphagia: Patient reportedly, developed dysphagia on 04/01/13. Evaluated by SLP on 04/03/13, and recommended D3 Diet. Patient is tolerating this well.   Code Status: Full Code.  Family Communication:   Disposition Plan: To be determined. Patient has been evaluated by PT/OT, and recommended HHPT/24-hr supervision- family to arrange.  Aiming discharge on  04/06/13.    Consultants:  Allyson Sabal: cardiology  Procedures:    Antibiotics:  kflex  HPI/Subjective: Not hungry this AM  no CP, no SOB  Objective: Filed Vitals:   04/04/13 1058 04/04/13 1300 04/04/13 1948 04/05/13 0526  BP: 134/80 141/89 169/85 155/69  Pulse: 91 91 82 83  Temp:  98.6 F (37 C) 98.3 F (36.8 C) 98.3 F (36.8 C)  TempSrc:  Oral Oral Oral  Resp:  18 19 19   Height:      Weight:    56.3 kg (124 lb 1.9 oz)  SpO2:  98% 99% 99%    Intake/Output Summary (Last 24 hours) at 04/05/13 0803 Last data filed at 04/05/13 0730  Gross per 24 hour  Intake    340 ml  Output      1 ml  Net  339 ml   Filed Weights   04/03/13 0539 04/04/13 0557 04/05/13 0526  Weight: 59.5 kg (131 lb 2.8 oz) 56.5 kg (124 lb 9  oz) 56.3 kg (124 lb 1.9 oz)    Exam:  General: Comfortable, alert, communicative,  Not short of breath at rest.  CHEST: Clinically clear to auscultation, no wheezes, no crackles.  HEART: Sounds 1 and 2 heard, normal, regular, no murmurs.  ABDOMEN: Full, soft, non-tender, no palpable organomegaly, no palpable masses.  LOWER EXTREMITIES: No pitting edema, palpable peripheral pulses.  MUSCULOSKELETAL SYSTEM: Generalized osteoarthritic changes, otherwise, normal.  CENTRAL NERVOUS SYSTEM: Tremulous, otherwise, no focal neurologic deficit on gross examination   Data Reviewed: Basic Metabolic Panel:  Recent Labs Lab 04/01/13 0511 04/01/13 1639 04/03/13 0530 04/04/13 0500 04/05/13 0520  NA 135 135 135 137 136  K 4.9 5.3* 3.5 3.4* 3.8  CL 105 105 104 105 104  CO2 20 19 20 21 22   GLUCOSE 91 91 96 102* 93  BUN 9 12 9 8 9   CREATININE 1.00 1.13* 0.94 0.94 1.07  CALCIUM 9.4 9.4 9.0 9.2 9.4  MG  --  1.7  --   --   --   PHOS  --  2.4  --   --   --    Liver Function Tests: No results found for this basename: AST, ALT, ALKPHOS, BILITOT, PROT, ALBUMIN,  in the last 168 hours No results found for this basename: LIPASE, AMYLASE,  in the last 168 hours No results found for this basename: AMMONIA,  in the last 168 hours CBC:  Recent Labs Lab 03/30/13 0336 03/31/13 0455 04/01/13 0511 04/01/13 1639 04/03/13 0530  WBC 11.3* 10.5 11.2* 11.1* 11.1*  NEUTROABS  --   --  7.8*  --   --   HGB 10.9* 9.9* 11.2* 11.4* 10.1*  HCT 30.4* 28.1* 31.6* 32.1* 27.8*  MCV 83.7 84.6 84.0 83.2 83.2  PLT 215 188 244 287 262   Cardiac Enzymes:  Recent Labs Lab 04/01/13 1639 04/02/13 1755 04/03/13 0015 04/03/13 0530  TROPONINI <0.30 <0.30 <0.30 <0.30   BNP (last 3 results)  Recent Labs  03/25/13 2035  PROBNP 7189.0*   CBG: No results found for this basename: GLUCAP,  in the last 168 hours  Recent Results (from the past 240 hour(s))  CULTURE, BLOOD (ROUTINE X 2)     Status: None    Collection Time    03/30/13 11:30 AM      Result Value Range Status   Specimen Description BLOOD LEFT ARM   Final   Special Requests BOTTLES DRAWN AEROBIC AND ANAEROBIC 10CC   Final   Culture  Setup Time 03/30/2013 22:08   Final   Culture     Final   Value:        BLOOD CULTURE RECEIVED NO GROWTH TO DATE CULTURE WILL BE HELD FOR 5 DAYS BEFORE ISSUING A FINAL NEGATIVE REPORT   Report Status PENDING   Incomplete  CULTURE, BLOOD (ROUTINE X 2)     Status: None   Collection Time    03/30/13 11:35 AM      Result Value Range Status   Specimen Description BLOOD LEFT HAND   Final   Special Requests BOTTLES DRAWN AEROBIC AND ANAEROBIC 10CC   Final   Culture  Setup Time 03/30/2013 22:08   Final   Culture     Final   Value:        BLOOD CULTURE RECEIVED NO GROWTH TO DATE CULTURE WILL  BE HELD FOR 5 DAYS BEFORE ISSUING A FINAL NEGATIVE REPORT   Report Status PENDING   Incomplete  CULTURE, BLOOD (ROUTINE X 2)     Status: None   Collection Time    04/01/13  5:11 PM      Result Value Range Status   Specimen Description BLOOD LEFT WRIST   Final   Special Requests BOTTLES DRAWN AEROBIC AND ANAEROBIC 10CC   Final   Culture  Setup Time 04/01/2013 21:28   Final   Culture     Final   Value:        BLOOD CULTURE RECEIVED NO GROWTH TO DATE CULTURE WILL BE HELD FOR 5 DAYS BEFORE ISSUING A FINAL NEGATIVE REPORT   Report Status PENDING   Incomplete  CULTURE, BLOOD (ROUTINE X 2)     Status: None   Collection Time    04/01/13  5:20 PM      Result Value Range Status   Specimen Description BLOOD RIGHT WRIST   Final   Special Requests BOTTLES DRAWN AEROBIC AND ANAEROBIC 10CC   Final   Culture  Setup Time 04/01/2013 21:29   Final   Culture     Final   Value:        BLOOD CULTURE RECEIVED NO GROWTH TO DATE CULTURE WILL BE HELD FOR 5 DAYS BEFORE ISSUING A FINAL NEGATIVE REPORT   Report Status PENDING   Incomplete     Studies: No results found.  Scheduled Meds: . aspirin  81 mg Oral Daily  . cephALEXin  500  mg Oral Q12H  . heparin  5,000 Units Subcutaneous Q8H  . isosorbide mononitrate  30 mg Oral Daily  . levothyroxine  75 mcg Oral QAC breakfast  . metoprolol succinate  50 mg Oral Daily  . OxyCODONE  10 mg Oral Q12H  . pantoprazole  40 mg Oral Q0600  . senna-docusate  1 tablet Oral BID  . traZODone  100 mg Oral QHS  . venlafaxine XR  75 mg Oral BID   Continuous Infusions:   Principal Problem:   Septic shock(785.52) Active Problems:   BACK PAIN, CHRONIC- on chronic narcotics at home   Urosepsis   Bacteremia due to Gram-negative bacteria   Aspiration pneumonia   Acute on chronic renal insufficiency- resolved   Altered mental status- suspected to be secondary to sepsis and narcotic withdrawl   Unspecified hypothyroidism   Normocytic anemia   GERD (gastroesophageal reflux disease)   Infiltrate noted on imaging study   Elevated troponin- felt to be secondary to SVT   SVT (supraventricular tachycardia)   Septic encephalopathy   Acute delirium   Opiate withdrawal   Dysphagia, unspecified    Time spent: 35    [facility], [place]  [facility] Pager [phone]. If 7PM-7AM, please contact night-coverage at [email or web address], password [facility] 04/05/2013, 8:03 AM  LOS: 11 days

## 2013-04-06 MED ORDER — DOCUSATE SODIUM 283 MG RE ENEM: 1.0000 | ENEMA | Freq: Every day | RECTAL | Status: DC | PRN

## 2013-04-06 MED ORDER — METOPROLOL SUCCINATE ER 50 MG PO TB24: 50.0000 mg | ORAL_TABLET | Freq: Every day | ORAL | Status: DC

## 2013-04-06 MED ORDER — ISOSORBIDE MONONITRATE ER 30 MG PO TB24: 30.0000 mg | ORAL_TABLET | Freq: Every day | ORAL | Status: DC

## 2013-04-06 MED ORDER — LEVOTHYROXINE SODIUM 75 MCG PO TABS: 75.0000 ug | ORAL_TABLET | Freq: Every day | ORAL | Status: DC

## 2013-04-06 MED ORDER — CEPHALEXIN 500 MG PO CAPS: 500.0000 mg | ORAL_CAPSULE | Freq: Two times a day (BID) | ORAL | Status: DC

## 2013-04-06 NOTE — Discharge Summary (Signed)
Physician Discharge Summary  Chelsea Lynch JYN:[id number] DOB: 07-Jun-1925 DOA: 03/25/2013  PCP: [name], MD  Admit date: 03/25/2013 Discharge date: 04/06/2013  Time spent: 45 minutes  Recommendations for Outpatient Follow-up:  Outpatient myocardial perfusion study recommended Home health  Discharge Diagnoses:  Principal Problem:   Septic shock(785.52) Active Problems:   BACK PAIN, CHRONIC- on chronic narcotics at home   Urosepsis   Bacteremia due to Gram-negative bacteria   Aspiration pneumonia   Acute on chronic renal insufficiency- resolved   Altered mental status- suspected to be secondary to sepsis and narcotic withdrawl   Unspecified hypothyroidism   Normocytic anemia   GERD (gastroesophageal reflux disease)   Infiltrate noted on imaging study   Elevated troponin- felt to be secondary to SVT   SVT (supraventricular tachycardia)   Septic encephalopathy   Acute delirium   Opiate withdrawal   Dysphagia, unspecified   Discharge Condition: improved  Diet recommendation: DYS 3 diet  Filed Weights   04/04/13 0557 04/05/13 0526 04/06/13 0459  Weight: 56.5 kg (124 lb 9 oz) 56.3 kg (124 lb 1.9 oz) 57.9 kg (127 lb 10.3 oz)    History of present illness:  79 years old female presents with AMS, urinary symptoms and hypotension. SVT   Hospital Course:  Septic shock(785.52)/urosepsis/bacteremia due to gram-negative bacteria: Patient presented with altered mental status, hypotension, and clinical findings, consistent with septic shock. Septic work up revealed UTI, gram-negative rod bacteremia and pansensitive Escherichia coli on urine culture. Patient was managed with appropriate antibiotics, Cephalosporin, with satisfactory clinical response. She is afebrile, and wbc has trended down nicely. Planning a 14-day course of antibiotics to be concluded on 04/07/13. Clinically, sepsis has resolved. Switched to oral Keflex  Hypokalemia: Repleted as indicated. Resolved.    Altered mental status/Encephalopathy: Patient has had altered mental status during her hospitalization, which is multi-factorial, secondary to known infection, narcotics withdrawal, and possibly benzodiazepines, associated with disorientation, forgetfulness and a mild tremor. Managed with prn Haldol, for agitation. Head CT scan of 04/01/13, showed atrophy and chronic microvascular ischemia. No acute abnormality. As of 04/03/13, patient's mental status has improved dramatically, and she is now more lucid/interactive. Further improvement is anticipated.  Hypothyroidism: On thyroxine replacement therapy.   Normocytic anemia: HGB is stable/reasonable.    Gastroesophageal reflux disease; Not problematic on PPI.   Acute renal failure: Creatinine was 1.81 at presentation, BUN 30. Responded to iv fluids, and AKI has resolved.    Left lower lobe infiltrate rule out aspiration pneumonia: Initial CXR revealed atelectasis versus pneumonia, which was felt adequately covered with Cefepime. Follow up chest CT scan showed no consolidation. CXR of 04/01/13 showed curvilinear left lower lobe atelectasis only.   Elevated troponin/SVT/Chest pain: Patient had elevated troponin and transient SVT, in the initial days of hospitalization. Cardiology consultation was provided by Dr Nanetta Batty, who has opined that these findings were likely from demand ischemia in the setting of sepsis. Patient does have a known history of PAF/Atrial tachycardia. Currently on beta-blocker. Outpatient myocardial perfusion study recommended. On 04/02/13, patient as an episode of chest pressure, relieved with SL NTG x 2. No SOB. Cardiac were cycled enzymes again, and remained unelevated. Commenced on Nitropaste on 04/02/13, without recurrence of symptoms. Have switched to Imdur today. Patient will follow up with Dr Nanetta Batty, on discharge.   HTN: BP significantly elevated on 04/02/13, improved with NTG and a change in Metoprolol to 50 mg  daily on 04/03/13. Now controlled.   Dysphagia: Patient reportedly, developed dysphagia on 04/01/13.  Evaluated by SLP on 04/03/13, and recommended D3 Diet. Patient is tolerating this well.   Procedures:    Consultations:  cardiology  Discharge Exam: Filed Vitals:   04/05/13 0526 04/05/13 1301 04/05/13 2051 04/06/13 0459  BP: 155/69 123/84 138/93 145/89  Pulse: 83 91 91 96  Temp: 98.3 F (36.8 C) 98.4 F (36.9 C) 98.1 F (36.7 C) 98.6 F (37 C)  TempSrc: Oral Oral Oral Oral  Resp: 19 19 19 20   Height:      Weight: 56.3 kg (124 lb 1.9 oz)   57.9 kg (127 lb 10.3 oz)  SpO2: 99% 98% 96% 95%    General: pleasant/cooperative Cardiovascular: rrr Respiratory: clear anterior  Discharge Instructions      Discharge Orders   Future Orders Complete By Expires     Discharge instructions  As directed     Comments:      Home health 24 hour care BMp in 1 week DYS 3 diet    Increase activity slowly  As directed         Medication List    STOP taking these medications       LINZESS 145 MCG Caps  Generic drug:  Linaclotide     omeprazole 20 MG capsule  Commonly known as:  PRILOSEC     trimethoprim 100 MG tablet  Commonly known as:  TRIMPEX      TAKE these medications       aspirin 81 MG tablet  Take 81 mg by mouth daily.     Biotin 5000 MCG Caps  Take 5,000 mcg by mouth daily.     cephALEXin 500 MG capsule  Commonly known as:  KEFLEX  Take 1 capsule (500 mg total) by mouth every 12 (twelve) hours.     docusate sodium 283 MG enema  Commonly known as:  ENEMEEZ  Place 1 enema (283 mg total) rectally daily as needed.     docusate sodium 100 MG capsule  Commonly known as:  COLACE  Take 100 mg by mouth daily with supper.     fish oil-omega-3 fatty acids 1000 MG capsule  Take 2 g by mouth daily.     furosemide 20 MG tablet  Commonly known as:  LASIX  Take 20 mg by mouth daily.     gabapentin 400 MG capsule  Commonly known as:  NEURONTIN  Take 400 mg by  mouth 3 (three) times daily.     isosorbide mononitrate 30 MG 24 hr tablet  Commonly known as:  IMDUR  Take 1 tablet (30 mg total) by mouth daily.     levothyroxine 75 MCG tablet  Commonly known as:  SYNTHROID, LEVOTHROID  Take 1 tablet (75 mcg total) by mouth daily before breakfast.     metoprolol succinate 50 MG 24 hr tablet  Commonly known as:  TOPROL-XL  Take 1 tablet (50 mg total) by mouth daily. Take with or immediately following a meal.     MYRBETRIQ 50 MG Tb24  Generic drug:  mirabegron ER  Take 50 mg by mouth daily.     oxyCODONE 10 MG 12 hr tablet  Commonly known as:  OXYCONTIN  Take 10 mg by mouth every 12 (twelve) hours.     oxyCODONE-acetaminophen 5-325 MG per tablet  Commonly known as:  PERCOCET/ROXICET  Take 1 tablet by mouth every 8 (eight) hours as needed for pain.     potassium chloride 10 MEQ tablet  Commonly known as:  K-DUR,KLOR-CON  Take 10 mEq by  mouth every other day.     traZODone 50 MG tablet  Commonly known as:  DESYREL  Take 100 mg by mouth at bedtime.     venlafaxine XR 75 MG 24 hr capsule  Commonly known as:  EFFEXOR-XR  Take 75 mg by mouth 2 (two) times daily.     Vitamin D (Ergocalciferol) 50000 UNITS Caps  Commonly known as:  DRISDOL  Take 50,000 Units by mouth every 7 (seven) days.     zaleplon 10 MG capsule  Commonly known as:  SONATA  Take 10 mg by mouth at bedtime.       Follow-up Information   Follow up with [name], MD In 1 week.   Contact information:   [address] [place] [address] [phone]       Follow up with Runell Gess, MD In 1 month.   Contact information:   [address] [place] [address] [phone]        The results of significant diagnostics from this hospitalization (including imaging, microbiology, ancillary and laboratory) are listed below for reference.    Significant Diagnostic Studies: Ct Head Wo Contrast  04/01/2013  *RADIOLOGY  REPORT*  Clinical Data: Confusion, encephalopathy.  Word finding difficulty  CT HEAD WITHOUT CONTRAST  Technique:  Contiguous axial images were obtained from the base of the skull through the vertex without contrast.  Comparison: None  Findings: Image quality degraded by motion.  Moderate to advanced atrophy.  Moderate chronic microvascular ischemia throughout the white matter bilaterally.  Negative for acute infarct.  Negative for hemorrhage or mass lesion.  Calvarium is negative.  IMPRESSION: Atrophy and chronic microvascular ischemia.  No acute abnormality.   Original Report Authenticated By: Janeece Riggers, M.D.    Dg Chest Port 1 View  04/01/2013  *RADIOLOGY REPORT*  Clinical Data: Acute shortness of breath  PORTABLE CHEST - 1 VIEW  Comparison: 03/30/2013  Findings: Neural stimulator again noted.  Right IJ central line tip over mid to distal SVC.  Heart size normal.  Aortic ectasia again noted.  Curvilinear left lower lobe probable atelectasis noted. Right costophrenic angle is omitted from the field of view of the right lung is grossly clear. Evidence of resection or osteolysis of the distal right clavicle again noted.  IMPRESSION: Curvilinear left lower lobe atelectasis.   Original Report Authenticated By: Christiana Pellant, M.D.    Dg Chest Port 1 View  03/30/2013  *RADIOLOGY REPORT*  Clinical Data: Recent blood infection question pneumonia  PORTABLE CHEST - 1 VIEW  Comparison: Portable exam 1135 hours compared to 03/27/2013  Findings: 5 joules of central venous catheter stable with tip projecting over SVC. Intraspinal stimulator leads unchanged. Normal heart size and pulmonary vascularity. Calcified aortic arch with question mild aneurysmal dilatation. Pulmonary vascularity normal. Minimal peribronchial thickening without infiltrate, pleural effusion or pneumothorax. Bones demineralized.  IMPRESSION: Mild bronchitic changes without acute infiltrate. Suspected aneurysmal dilatation of the aortic arch;  follow-up CT chest recommended to assess aortic dimensions.   Original Report Authenticated By: Ulyses Southward, M.D.    Dg Chest Port 1 View  03/27/2013  *RADIOLOGY REPORT*  Clinical Data: Shortness of breath.  PORTABLE CHEST - 1 VIEW  Comparison: Chest 03/25/2013 and 03/26/2013.  Findings: There has been increase in bibasilar atelectasis. Cardiomegaly and mild vascular congestion noted.  Dilated aortic arch is unchanged.  No pneumothorax or pleural effusion. Spinal stimulator device in right IJ catheter again noted.  IMPRESSION: Increased bibasilar atelectasis.   Original Report  Authenticated By: Holley Dexter, M.D.    Dg Chest Portable 1 View  03/26/2013  *RADIOLOGY REPORT*  Clinical Data: Central line placement.  PORTABLE CHEST - 1 VIEW  Comparison: Chest radiograph performed 03/25/2013, and CT myelogram performed 12/31/2010  Findings: The patient's right IJ line is seen ending about the mid to distal SVC.  Lung expansion is slightly improved.  Vascular congestion is noted. Mild bibasilar opacities likely reflect atelectasis; left basilar airspace opacity has improved.  No definite pleural effusion or pneumothorax is seen.  The cardiomediastinal silhouette is normal in size; calcification is noted within the aortic arch.  Given the pattern of calcification, there is suspicion for mild dilatation of the aortic arch.  No acute osseous abnormalities are seen.  The patient is status post right-sided rotator cuff repair.  Clips are noted within the right upper quadrant, reflecting prior cholecystectomy.  IMPRESSION:  1.  Right IJ line ends about the mid to distal SVC. 2.  Lung expansion has slightly improved; vascular congestion noted.  Mild bibasilar opacities likely reflect atelectasis; left basilar airspace opacity has improved. 3.  Question of mild dilatation of the aortic arch; this appears slightly more prominent than in 2012, when the aortic arch measured 4.0 cm in diameter on CT.   Original Report  Authenticated By: Tonia Ghent, M.D.    Dg Chest Port 1 View  03/25/2013  *RADIOLOGY REPORT*  Clinical Data: 79 year old female altered mental status shortness of breath.  PORTABLE CHEST - 1 VIEW  Comparison: Thoracic spine CT 12/31/2010.  CT abdomen 01/17/2010.  Findings: Portable semi upright AP view at 2039 hours.  Stable to mildly lower lung volumes.  Mild left basilar opacity.  Stable cardiac size and mediastinal contours.  Chronic thoracic spinal stimulator lead appears stable.  Stable right upper quadrant surgical clips. Visualized tracheal air column is within normal limits.  Chronic deficiency of the distal right clavicle. No pneumothorax.  No pleural effusion identified.  IMPRESSION: Mildly lower lung volumes.  Patchy left lung base opacity, favor atelectasis but left lung base pneumonia not excluded.   Original Report Authenticated By: Erskine Speed, M.D.     Microbiology: Recent Results (from the past 240 hour(s))  CULTURE, BLOOD (ROUTINE X 2)     Status: None   Collection Time    03/30/13 11:30 AM      Result Value Range Status   Specimen Description BLOOD LEFT ARM   Final   Special Requests BOTTLES DRAWN AEROBIC AND ANAEROBIC 10CC   Final   Culture  Setup Time 03/30/2013 22:08   Final   Culture NO GROWTH 5 DAYS   Final   Report Status 04/05/2013 FINAL   Final  CULTURE, BLOOD (ROUTINE X 2)     Status: None   Collection Time    03/30/13 11:35 AM      Result Value Range Status   Specimen Description BLOOD LEFT HAND   Final   Special Requests BOTTLES DRAWN AEROBIC AND ANAEROBIC 10CC   Final   Culture  Setup Time 03/30/2013 22:08   Final   Culture NO GROWTH 5 DAYS   Final   Report Status 04/05/2013 FINAL   Final  CULTURE, BLOOD (ROUTINE X 2)     Status: None   Collection Time    04/01/13  5:11 PM      Result Value Range Status   Specimen Description BLOOD LEFT WRIST   Final   Special Requests BOTTLES DRAWN AEROBIC AND ANAEROBIC 10CC   Final  Culture  Setup Time 04/01/2013  21:28   Final   Culture     Final   Value:        BLOOD CULTURE RECEIVED NO GROWTH TO DATE CULTURE WILL BE HELD FOR 5 DAYS BEFORE ISSUING A FINAL NEGATIVE REPORT   Report Status PENDING   Incomplete  CULTURE, BLOOD (ROUTINE X 2)     Status: None   Collection Time    04/01/13  5:20 PM      Result Value Range Status   Specimen Description BLOOD RIGHT WRIST   Final   Special Requests BOTTLES DRAWN AEROBIC AND ANAEROBIC 10CC   Final   Culture  Setup Time 04/01/2013 21:29   Final   Culture     Final   Value:        BLOOD CULTURE RECEIVED NO GROWTH TO DATE CULTURE WILL BE HELD FOR 5 DAYS BEFORE ISSUING A FINAL NEGATIVE REPORT   Report Status PENDING   Incomplete     Labs: Basic Metabolic Panel:  Recent Labs Lab 04/01/13 0511 04/01/13 1639 04/03/13 0530 04/04/13 0500 04/05/13 0520  NA 135 135 135 137 136  K 4.9 5.3* 3.5 3.4* 3.8  CL 105 105 104 105 104  CO2 20 19 20 21 22   GLUCOSE 91 91 96 102* 93  BUN 9 12 9 8 9   CREATININE 1.00 1.13* 0.94 0.94 1.07  CALCIUM 9.4 9.4 9.0 9.2 9.4  MG  --  1.7  --   --   --   PHOS  --  2.4  --   --   --    Liver Function Tests: No results found for this basename: AST, ALT, ALKPHOS, BILITOT, PROT, ALBUMIN,  in the last 168 hours No results found for this basename: LIPASE, AMYLASE,  in the last 168 hours No results found for this basename: AMMONIA,  in the last 168 hours CBC:  Recent Labs Lab 03/31/13 0455 04/01/13 0511 04/01/13 1639 04/03/13 0530  WBC 10.5 11.2* 11.1* 11.1*  NEUTROABS  --  7.8*  --   --   HGB 9.9* 11.2* 11.4* 10.1*  HCT 28.1* 31.6* 32.1* 27.8*  MCV 84.6 84.0 83.2 83.2  PLT 188 244 287 262   Cardiac Enzymes:  Recent Labs Lab 04/01/13 1639 04/02/13 1755 04/03/13 0015 04/03/13 0530  TROPONINI <0.30 <0.30 <0.30 <0.30   BNP: BNP (last 3 results)  Recent Labs  03/25/13 2035  PROBNP 7189.0*   CBG: No results found for this basename: GLUCAP,  in the last 168 hours     Signed:  Benjamine Mola, Chelsea Lynch  [facility] 04/06/2013, 9:26 AM

## 2013-04-06 NOTE — Progress Notes (Addendum)
Physical Therapy Treatment Patient Details Name: Chelsea Lynch MRN: [id number] DOB: Nov 15, 1925 Today's Date: 04/06/2013 Time: [id number] PT Time Calculation (min): 24 min  PT Assessment / Plan / Recommendation Comments on Treatment Session  Patient with better activty tolerance today. Spoke at length with patient and family regarding assist needed at discharge as well as recommendations for mobility at home. Advised that patient does requiring hands on assist with mobility at this time regardless of assistive device. Will continue to work with patient and progress activity as tolerated. Rec HHPT and family assist upon discharge.    Follow Up Recommendations  Home health PT;Supervision/Assistance - 24 hour     Does the patient have the potential to tolerate intense rehabilitation     Barriers to Discharge        Equipment Recommendations  Other (comment) (4 wheeled RW)    Recommendations for Other Services OT consult  Frequency Min 3X/week   Plan Discharge plan remains appropriate    Precautions / Restrictions Precautions Precautions: Fall Precaution Comments: confused (clearing) Restrictions Weight Bearing Restrictions: No   Pertinent Vitals/Pain No pain at this time    Mobility  Bed Mobility Bed Mobility: Supine to Sit;Sitting - Scoot to Edge of Bed Supine to Sit: 4: Min assist Sitting - Scoot to [place] of Bed: 4: Min assist Sit to Supine: 4: Min assist;HOB flat Details for Bed Mobility Assistance: VCs for hand placement and encouragement to participate Transfers Transfers: Sit to Stand;Stand to Sit Sit to Stand: 4: Min guard;With upper extremity assist;From chair/3-in-1 Stand to Sit: 4: Min guard;With upper extremity assist;To chair/3-in-1 Details for Transfer Assistance: Better attention to safety cues today for hand placement and control Ambulation/Gait Ambulation/Gait Assistance: 4: Min assist Ambulation Distance (Feet): 200 Feet Assistive device: 1 person  hand held assist Ambulation/Gait Assistance Details: Patient easily distracted when passing by other rooms. Once distracted patients begins to present with balance difficulties requiring assist to correct and verbal cues to direct to task. Gait Pattern: Step-through pattern;Decreased stride length;Shuffle;Trunk flexed;Narrow base of support Gait velocity: decreased Stairs: No Wheelchair Mobility Wheelchair Mobility: No      PT Goals Acute Rehab PT Goals PT Goal Formulation: With patient/family Time For Goal Achievement: 04/03/13 Potential to Achieve Goals: Good Pt will go Supine/Side to Sit: with supervision;with HOB 0 degrees PT Goal: Supine/Side to Sit - Progress: Progressing toward goal Pt will go Sit to Supine/Side: with supervision;with HOB 0 degrees PT Goal: Sit to Supine/Side - Progress: Progressing toward goal Pt will go Sit to Stand: with supervision;with cues (comment type and amount) PT Goal: Sit to Stand - Progress: Progressing toward goal Pt will go Stand to Sit: with supervision;with cues (comment type and amount) PT Goal: Stand to Sit - Progress: Progressing toward goal Pt will Ambulate: 16 - 50 feet;with supervision;with least restrictive assistive device PT Goal: Ambulate - Progress: Progressing toward goal  Visit Information  Last PT Received On: 04/06/13 Assistance Needed: +1    Subjective Data  Subjective: I slept much better last night Patient Stated Goal: to go home   Cognition  Cognition Arousal/Alertness: Awake/alert Overall Cognitive Status: Impaired/Different from baseline Area of Impairment: Attention;Memory;Following commands;Safety/judgement;Awareness;Problem solving Current Attention Level: Selective Memory: Decreased recall of precautions Following Commands: Follows one step commands inconsistently Safety/Judgement: Decreased awareness of safety;Decreased awareness of deficits Awareness: Intellectual Problem Solving: Slow processing;Decreased  initiation;Difficulty sequencing;Requires verbal cues;Requires tactile cues General Comments: Pt with better cognition today, but still with difficulty staying on task with distractions  Balance  Balance Balance Assessed: Yes Static Standing Balance Static Standing - Balance Support: Bilateral upper extremity supported Static Standing - Level of Assistance: 4: Min assist Dynamic Standing Balance Dynamic Standing - Balance Support: Bilateral upper extremity supported Dynamic Standing - Level of Assistance: 4: Min assist  End of Session PT - End of Session Equipment Utilized During Treatment: Gait belt Activity Tolerance: Patient tolerated treatment well Patient left: in chair;with call bell/phone within reach;with family/visitor present Nurse Communication: Mobility status   GP     Fabio Asa 04/06/2013, 9:28 AM Charlotte Crumb, PT DPT  [phone]

## 2013-04-07 LAB — CULTURE, BLOOD (ROUTINE X 2): Culture: NO GROWTH

## 2013-05-05 ENCOUNTER — Ambulatory Visit: Payer: Medicare Other | Admitting: Cardiology

## 2013-05-07 HISTORY — PX: NM MYOCAR PERF WALL MOTION: HXRAD629

## 2013-05-14 ENCOUNTER — Encounter: Payer: Self-pay | Admitting: *Deleted

## 2013-05-15 ENCOUNTER — Ambulatory Visit (INDEPENDENT_AMBULATORY_CARE_PROVIDER_SITE_OTHER): Payer: Medicare Other | Admitting: Cardiology

## 2013-05-15 ENCOUNTER — Encounter: Payer: Self-pay | Admitting: Cardiology

## 2013-05-15 VITALS — BP 118/88 | HR 77 | Ht 64.5 in | Wt 126.7 lb

## 2013-05-15 DIAGNOSIS — I4891 Unspecified atrial fibrillation: Secondary | ICD-10-CM

## 2013-05-15 DIAGNOSIS — R7989 Other specified abnormal findings of blood chemistry: Secondary | ICD-10-CM

## 2013-05-15 DIAGNOSIS — R079 Chest pain, unspecified: Secondary | ICD-10-CM

## 2013-05-15 DIAGNOSIS — I480 Paroxysmal atrial fibrillation: Secondary | ICD-10-CM | POA: Insufficient documentation

## 2013-05-15 DIAGNOSIS — I471 Supraventricular tachycardia: Secondary | ICD-10-CM

## 2013-05-15 DIAGNOSIS — I498 Other specified cardiac arrhythmias: Secondary | ICD-10-CM

## 2013-05-15 NOTE — Patient Instructions (Addendum)
As we discussed, the plan from the hospital was to check a Nuclear Stress Test once you are more stable post discharge.  I think we should go ahead with that plan.  As long as the test is relatively normal, we will simply follow-up in 3 months.  If significantly abnormal, will see your sooner.  I want you to take your Metoprolol 1/2 tab 2 times daily - ~ every 12 hours.  Marykay Lex, MD

## 2013-05-15 NOTE — Progress Notes (Signed)
Patient ID: Chelsea Lynch, female   DOB: [birth year], 79 y.o.   MRN: [id number]  Clinic Note: HPI: Chelsea Lynch is a 79 y.o. female with a PMH below who presents today for hospital followup. She was admitted from April 19 21st of May and with a UTI developed septic shock. She gram-negative bacteremia and aspiration pneumonia. She developed acute on chronic renal insufficiency. This is complicated by recurrence of her supraventricular tachycardia and having actually an elevated troponin levels. After spending the initial stages of the hospitalization in the ICU, she then spent a prolonged period of time min Hospital because she had acute withdrawal from narcotics and the delirium.  Of note she carries a diagnosis of atrial fibrillation, however am not really sure how the episode she truly had. Simply because of her weakness and unsteady gait, in the past we have decided not to use anticoagulation. The ball was initiated to watch her fall risk. This was an extended conversation between the patient and her daughter was here with her today and I myself. We discussed the risks and benefits and agreed to even though she had a stroke, her risk of falls was too great.  Interval History: She is now home and has home health and have seen her she got PT and OT working with her she still weak and barely getting back to her routine activities. She's only had one episode where she felt some chest discomfort since discharge. She is lying there not doing anything and she felt a heavy pressure sensation across her chest. It lasted a few minutes and then subsequently went away.  Other than that. She really denies any other episodes of chest discomfort or shortness of breath with rest or exertion. Although she's not really do not know if any exertion. She denies any recurrence of palpitations or near-syncopal type symptoms. She has had some intermittent chest fluttering, but no rapid rate sensations. She denies any PND,  orthopnea, edema. No significant issues with her symptoms. Note amaurosis fugax or TIA symptoms.  Currently she had one recurrence of UTI symptoms. She went to see Dr. Patsi Sears from urology and was treated with an antibiotic. Does not 2 weeks ago. She is low the concern is the next couple weeks her home health stopped. She just doesn't feel back to normal.  During her hospitalization she was seen in consultation by Dr. Allyson Sabal for the +1 levels. His recommendation was that it was most likely due to rate related myocardial ischemia, but recommended outpatient Myoview stress testing. The plan was for her to be little more stable prior to doing that. Chelsea Lynch is here today with the hopes to proceed with this test.   Past Medical History  Diagnosis Date  . GERD (gastroesophageal reflux disease)   . Depression   . Neuropathy   . Constipation, chronic   . Insomnia   . Chronic pain   . Hypothyroid   . Anxiety   . Stroke   . Frequent falls   . PAF (paroxysmal atrial fibrillation)     not on anticoagulation due to a fall risk, wore an Event monitor 02//23/11-03/03/11  . PAT (paroxysmal atrial tachycardia)     Supraventricular tachycardia; most recent episode associated with sepsis  . Hypertension   . AAA (abdominal aortic aneurysm) without rupture     Followed by routine Dopplers    Prior Cardiac Evaluation and Past Surgical History: Past Surgical History  Procedure Laterality Date  . Nm myocar perf wall motion  February 06, 2010    normal, EF 77%  . Nm myocar perf wall motion  04/26/2012    Lexiscan -small mostly fixed septal artifact,no evidence of infarct or ischemia  . Doppler echocardiography  03/2013    Normal EF 66 5%. Mild/moderate aortic regurgitation, mildly dilated RV with moderate pulmonary hypertension  . Abdomnal aorta  02/25/2012    mid abd. aorta  mid segment diltaton 3.93 x 4.55 cm greatest diameter.mild amt atherosclerosis without evidence of sigificant diamter reduction  .  Lower arterial extremities doppler  02/19/2011    right abi 0.98,left 0.97  . Renal doppler  02/12/2011    abd aorta prox 2.6 x 3.1 cm,mid 4.1 x 3.6 cm, distal 2.3 x 3.4 cm, right renal artery 1-59% left renal artery norm. ,both renal size normal  . Renal doppler  02/11/2010    infrarenal AAA mearsuring 3.6 x 3.7 cm    Allergies  Allergen Reactions  . Clarithromycin Other (See Comments)    Unknown   . Codeine Other (See Comments)    Unknown   . Penicillins Other (See Comments)    Unknown   . Tequin (Gatifloxacin) Other (See Comments)    Unknown     Current Outpatient Prescriptions  Medication Sig Dispense Refill  . aspirin 81 MG tablet Take 81 mg by mouth daily.      . Biotin 5000 MCG CAPS Take 5,000 mcg by mouth daily.      Marland Kitchen docusate sodium (COLACE) 100 MG capsule Take 100 mg by mouth daily with supper.      . docusate sodium (ENEMEEZ) 283 MG enema Place 1 enema (283 mg total) rectally daily as needed.  30 each  0  . fish oil-omega-3 fatty acids 1000 MG capsule Take 2 g by mouth daily.      . furosemide (LASIX) 20 MG tablet Take 20 mg by mouth daily.      Marland Kitchen gabapentin (NEURONTIN) 400 MG capsule Take 400 mg by mouth 3 (three) times daily.      . isosorbide mononitrate (IMDUR) 30 MG 24 hr tablet Take 1 tablet (30 mg total) by mouth daily.  30 tablet  0  . levothyroxine (SYNTHROID, LEVOTHROID) 75 MCG tablet Take 1 tablet (75 mcg total) by mouth daily before breakfast.  30 tablet  0  . metoprolol succinate (TOPROL-XL) 50 MG 24 hr tablet Take 1 tablet (50 mg total) by mouth daily. Take with or immediately following a meal.  30 tablet  0  . mirabegron ER (MYRBETRIQ) 50 MG TB24 Take 50 mg by mouth daily.      . nitrofurantoin (MACRODANTIN) 100 MG capsule Take 1 capsule by mouth every evening.      Marland Kitchen oxyCODONE (OXYCONTIN) 10 MG 12 hr tablet Take 10 mg by mouth every 12 (twelve) hours.      Marland Kitchen oxyCODONE-acetaminophen (PERCOCET/ROXICET) 5-325 MG per tablet Take 1 tablet by mouth every 8  (eight) hours as needed for pain.      . potassium chloride (K-DUR,KLOR-CON) 10 MEQ tablet Take 10 mEq by mouth every other day.      . venlafaxine XR (EFFEXOR-XR) 75 MG 24 hr capsule Take 75 mg by mouth 2 (two) times daily.      . Vitamin D, Ergocalciferol, (DRISDOL) 50000 UNITS CAPS Take 50,000 Units by mouth every 7 (seven) days.      . zaleplon (SONATA) 10 MG capsule Take 10 mg by mouth at bedtime.      . traZODone (DESYREL) 50 MG  tablet Take 100 mg by mouth at bedtime.       No current facility-administered medications for this visit.    History   Social History  . Marital Status: Married    Spouse Name: N/A    Number of Children: N/A  . Years of Education: N/A   Occupational History  . Not on file.   Social History Main Topics  . Smoking status: Former Games developer  . Smokeless tobacco: Never Used  . Alcohol Use: No  . Drug Use: No  . Sexually Active: No   Other Topics Concern  . Not on file   Social History Narrative  . No narrative on file    ROS: A comprehensive Review of Systems - General ROS: positive for  - fatigue and malaise negative for - chills, fever or night sweats Genito-Urinary ROS: positive for - dysuria negative for - hematuria, incontinence or nocturia Musculoskeletal ROS: positive for - gait disturbance and muscular weakness Otherwise generally just feels fatigued and tired. Unsteady with her gait.  PHYSICAL EXAM BP 118/88  Pulse 77  Ht 5' 4.5" (1.638 m)  Wt 126 lb 11.2 oz (57.471 kg)  BMI 21.42 kg/m2 General appearance: alert, cooperative, appears stated age, no distress and Normal mood and affect. She's not that responsive today and patient is not really feeling very well. She is well-groomed well-nourished. She does answer questions appropriately but does not report to me with information. She's lying down 1 exam. Neck: no adenopathy, no carotid bruit, no JVD, supple, symmetrical, trachea midline and thyroid not enlarged, symmetric, no  tenderness/mass/nodules Lungs: clear to auscultation bilaterally, normal percussion bilaterally and Mild interstitial sounds but nothing significant. No W./R./R. Heart: regularly irregular rhythm, S1, S2 normal, systolic murmur: systolic ejection 2/6, crescendo, decrescendo and harsh at 2nd right intercostal space, radiates to carotids, no click and no rub Abdomen: soft, non-tender; bowel sounds normal; no masses,  no organomegaly Extremities: extremities normal, atraumatic, no cyanosis or edema, no edema, redness or tenderness in the calves or thighs, no ulcers, gangrene or trophic changes and varicose veins noted Pulses: 2+ and symmetric Neurologic: Mental status: Alert, oriented, thought content appropriate, affect: normal HEENT: [place]/AT, EOMI, MMM, anicteric sclera  [id number] today: Yes Rate: 77 , Rhythm: Sinus rhythm with sinus arrhythmia, significant artifact, but otherwise relatively normal ECG.;     ASSESSMENT: Relatively well, mostly deconditioned and recovering from her he prolonged hospitalization with a UTI related sepsis / septic shock. No further episodes of tachycardia palpitations. Her metoprolol dose was increased to 50 mg a day. In the past to have it have had issues with orthostatic changes. At the beginning change that to just 25 twice a day of metoprolol XL.  Chest pain with moderate risk for cardiac etiology - Plan: EKG 12-Lead, Myocardial Perfusion Imaging  PLAN: Per problem list. Orders Placed This Encounter  Procedures  . Myocardial Perfusion Imaging    Standing Status: Future     Number of Occurrences:      Standing Expiration Date: 05/15/2014    Order Specific Question:  Where should this test be performed    Answer:  [facility]    Order Specific Question:  Type of stress    Answer:  Lexiscan    Order Specific Question:  Patient weight in lbs    Answer:  126  . EKG 12-Lead   Meds ordered this encounter  Medications  . DISCONTD: potassium chloride  (K-DUR) 10 MEQ tablet    Sig: Take 1 tablet  by mouth every other day.  . nitrofurantoin (MACRODANTIN) 100 MG capsule    Sig: Take 1 capsule by mouth every evening.    Followup: In roughly 3 months unless her stress test is abnormal.  Marykay Lex, M.D., M.S. [facility] [address] [place], [place]  [address]  [phone] Pager # [phone] 05/15/2013 9:44 PM

## 2013-05-15 NOTE — Assessment & Plan Note (Signed)
While I think that the episode of chest discomfort he had is most likely not cardiac, she did have elevated troponins in the hospital and therefore I think recently some respiratory cardiac in nature. As per plan in the hospital we will then proceed with a LexiScan Myoview to be sure there is no flow-limiting ischemic lesions.

## 2013-05-15 NOTE — Assessment & Plan Note (Signed)
She does have a history of itch fibrillation, but after long discussions with her daughter and the patient is a described above. To be determined she is not a good anticoagulation candidate due to fall risk. She does have a significantly elevated CHADS2 score, and maybe she'll more stable the future we can consider redressing this issue.

## 2013-05-15 NOTE — Assessment & Plan Note (Signed)
This is quite likely due to the hyper adrenergic state and sepsis getting tachycardic with hypotension and that probably led to some global cardiac ischemia. As I discussed above plan we did look for any macrovascular disease with an LexiScan Myoview.

## 2013-05-15 NOTE — Assessment & Plan Note (Signed)
This is associated with sepsis and septic shock. I really don't think that this is a major issue for her at in a normal garment. She is on beta blocker and switch him 25 twice a day of Toprol XL. If this were measured the middle lower blood pressure too much of a higher dose of beta blocker.  I did talk with her and her daughter about potential maneuvers to perform to break these episodes they do occur such as vagal maneuvers and coughing. I also recommended that she feels them coming on and then take it be extracted as of metoprolol that time and it measures significant. She appeared to stay adequately hydrated which is one of the fracture.

## 2013-05-16 ENCOUNTER — Other Ambulatory Visit (HOSPITAL_COMMUNITY): Payer: Self-pay | Admitting: *Deleted

## 2013-05-23 ENCOUNTER — Ambulatory Visit (HOSPITAL_COMMUNITY)
Admission: RE | Admit: 2013-05-23 | Discharge: 2013-05-23 | Disposition: A | Discharge status: Home / Self Care | Payer: Medicare Other | Source: Ambulatory Visit | Attending: Cardiology | Admitting: Cardiology

## 2013-05-23 DIAGNOSIS — R0602 Shortness of breath: Secondary | ICD-10-CM | POA: Insufficient documentation

## 2013-05-23 DIAGNOSIS — R42 Dizziness and giddiness: Secondary | ICD-10-CM | POA: Insufficient documentation

## 2013-05-23 DIAGNOSIS — R079 Chest pain, unspecified: Secondary | ICD-10-CM

## 2013-05-23 MED ORDER — TECHNETIUM TC 99M SESTAMIBI GENERIC - CARDIOLITE
29.5000 | Freq: Once | INTRAVENOUS | Status: AC | PRN
  Administered 2013-05-23: 30 via INTRAVENOUS

## 2013-05-23 MED ORDER — TECHNETIUM TC 99M SESTAMIBI GENERIC - CARDIOLITE
11.0000 | Freq: Once | INTRAVENOUS | Status: AC | PRN
  Administered 2013-05-23: 11 via INTRAVENOUS

## 2013-05-23 MED ORDER — REGADENOSON 0.4 MG/5ML IV SOLN
0.4000 mg | Freq: Once | INTRAVENOUS | Status: AC
  Administered 2013-05-23: 0.4 mg via INTRAVENOUS

## 2013-05-23 NOTE — Procedures (Addendum)
[facility] [address] [place] [address] [place] [address] [phone]  Cardiology Nuclear Med Study  Chelsea Lynch is a 79 y.o. female     MRN : [id number]     DOB: [birth year]  Procedure Date: 05/23/2013  Nuclear Med Background Indication for Stress Test:  Evaluation for Ischemia, Post Hospital and Abnormal EKG History:  SVT;AF;PAT Cardiac Risk Factors: CVA, Family History - CAD, History of Smoking, Hypertension, PVD and AAA  Symptoms:  Chest Pain, Light-Headedness and SOB   Nuclear Pre-Procedure Caffeine/Decaff Intake:  1:00am NPO After: 11 AM   IV Site: R Antecubital  IV 0.9% NS with Angio Cath:  22g  Chest Size (in):  N/A IV Started by: Emmit Pomfret, RN  Height: 5' 4.5" (1.638 m)  Cup Size: C  BMI:  Body mass index is 21.3 kg/(m^2). Weight:  126 lb (57.153 kg)   Tech Comments:  N/A     Nuclear Med Study 1 or 2 day study: 1 day  Stress Test Type:  Lexiscan  Order Authorizing Provider:  Bryan Lemma, MD   Resting Radionuclide: Technetium 58m Sestamibi  Resting Radionuclide Dose: 11.0 mCi   Stress Radionuclide:  Technetium 44m Sestamibi  Stress Radionuclide Dose: 29.5 mCi           Stress Protocol Rest HR: 75 Stress HR: 85  Rest BP: 122/88 Stress BP: 132/104  Exercise Time (min): n/a METS: n/a   Predicted Max HR: 134 bpm % Max HR: 63.43 bpm Rate Pressure Product: 95284  Dose of Adenosine (mg):  n/a Dose of Lexiscan: 0.4 mg  Dose of Atropine (mg): n/a Dose of Dobutamine: n/a mcg/kg/min (at max HR)  Stress Test Technologist: Esperanza Sheets, CCT Nuclear Technologist: Gonzella Lex, CNMT   Rest Procedure:  Myocardial perfusion imaging was performed at rest 45 minutes following the intravenous administration of Technetium 65m Sestamibi. Stress Procedure:  The patient received IV Lexiscan 0.4 mg over 15-seconds.  Technetium 26m Sestamibi injected at 30-seconds.  There were no significant changes with Lexiscan.   Quantitative spect images were obtained after a 45 minute delay.  Transient Ischemic Dilatation (Normal <1.22):  0.85 Lung/Heart Ratio (Normal <0.45):  0.31 QGS EDV:  39 ml QGS ESV:  5 ml LV Ejection Fraction: 88%  Rest ECG: NSR - Normal EKG  Stress ECG: No significant change from baseline ECG  QPS Raw Data Images:  Normal; no motion artifact; normal heart/lung ratio. Stress Images:  Mild breast artifact of the anterior wall. Rest Images:  Mild breast artifact of the anterior wall. Subtraction (SDS):  No evidence of ischemia.  Impression Exercise Capacity:  Lexiscan with no exercise. BP Response:  Hypotensive blood pressure response. Clinical Symptoms:  Mild chest pain/dyspnea. ECG Impression:  There are scattered PVCs. Comparison with Prior Nuclear Study: No significant change from previous study  Overall Impression:  Low risk stress nuclear study without reversible ischemia. Mild distal anteroseptal breast artifact..  LV Wall Motion:  NL LV Function; NL Wall Motion; EF 88%.  Chrystie Nose, MD, [facility] Board Certified in Nuclear Cardiology Attending Cardiologist [facility]  Chrystie Nose, MD  05/23/2013 6:09 PM

## 2013-05-25 ENCOUNTER — Telehealth: Payer: Self-pay | Admitting: *Deleted

## 2013-05-25 NOTE — Telephone Encounter (Signed)
Result given to her daughter.

## 2013-05-25 NOTE — Telephone Encounter (Signed)
Message copied by Tobin Chad on Thu May 25, 2013 10:50 AM ------      Message from: [facility], DAVID      Created: Tue May 23, 2013 11:43 PM       No evidence of any ischemia or infarction. Relatively normal stress test.      Mild breast attenuation but otherwise really normal.      Marykay Lex, MD       ------

## 2013-07-28 ENCOUNTER — Ambulatory Visit (INDEPENDENT_AMBULATORY_CARE_PROVIDER_SITE_OTHER): Payer: Medicare Other | Admitting: Emergency Medicine

## 2013-07-28 VITALS — BP 122/76 | HR 81 | Temp 98.0°F | Resp 17 | Ht 64.0 in | Wt 133.0 lb

## 2013-07-28 DIAGNOSIS — S0101XA Laceration without foreign body of scalp, initial encounter: Secondary | ICD-10-CM

## 2013-07-28 DIAGNOSIS — R51 Headache: Secondary | ICD-10-CM

## 2013-07-28 DIAGNOSIS — S0100XA Unspecified open wound of scalp, initial encounter: Secondary | ICD-10-CM

## 2013-07-28 NOTE — Progress Notes (Signed)
[facility] [address], [place] [address] [phone]  Date:  07/28/2013   Name:  Chelsea Lynch   DOB:  1925-03-11   MRN:  [id number]  PCP:  [name], MD    Chief Complaint: Head Injury   History of Present Illness:  Chelsea Lynch is a 79 y.o. very pleasant female patient who presents with the following:  Rolled out of bed last night and fell to the floor.  Hit her head against the night stand.  No LOC.  No neuro or visual symptoms.  Has a laceration right parietal area.  No neck or back pain.  No hip pain.  No improvement with over the counter medications or other home remedies. Denies other complaint or health concern today.   Patient Active Problem List   Diagnosis Date Noted  . Chest pain with moderate risk for cardiac etiology 05/15/2013  . PAF (paroxysmal atrial fibrillation)   . Dysphagia, unspecified(787.20) 04/02/2013  . Opiate withdrawal 03/29/2013  . Unspecified hypothyroidism 03/28/2013  . Normocytic anemia 03/28/2013  . GERD (gastroesophageal reflux disease) 03/28/2013  . Infiltrate noted on imaging study 03/28/2013  . Elevated troponin- felt to be secondary to SVT 03/28/2013  . SVT (supraventricular tachycardia) 03/28/2013  . Urosepsis 03/27/2013  . Bacteremia due to Gram-negative bacteria 03/27/2013  . Septic shock(785.52) 03/27/2013  . Aspiration pneumonia 03/27/2013  . Acute on chronic renal insufficiency- resolved 03/27/2013  . BACK PAIN, CHRONIC- on chronic narcotics at home 01/23/2010  . NONSPECIFIC ABN FINDNG RAD&OTH EXAM BILARY TRCT 01/23/2010  . TIA 01/22/2010  . DIVERTICULOSIS, COLON 01/22/2010  . GASTRITIS, ACUTE 01/05/2008  . CONSTIPATION, SLOW TRANSIT 01/05/2008    Past Medical History  Diagnosis Date  . GERD (gastroesophageal reflux disease)   . Depression   . Neuropathy   . Constipation, chronic   . Insomnia   . Chronic pain   . Hypothyroid   . Anxiety   . Stroke   . Frequent falls   . PAF  (paroxysmal atrial fibrillation)     not on anticoagulation due to a fall risk, wore an Event monitor 02//23/11-03/03/11  . PAT (paroxysmal atrial tachycardia)     Supraventricular tachycardia; most recent episode associated with sepsis  . Hypertension   . AAA (abdominal aortic aneurysm) without rupture     Followed by routine Dopplers    Past Surgical History  Procedure Laterality Date  . Nm myocar perf wall motion  February 06, 2010    normal, EF 77%  . Nm myocar perf wall motion  04/26/2012    Lexiscan -small mostly fixed septal artifact,no evidence of infarct or ischemia  . Doppler echocardiography  03/2013    Normal EF 66 5%. Mild/moderate aortic regurgitation, mildly dilated RV with moderate pulmonary hypertension  . Abdomnal aorta  02/25/2012    mid abd. aorta  mid segment diltaton 3.93 x 4.55 cm greatest diameter.mild amt atherosclerosis without evidence of sigificant diamter reduction  . Lower arterial extremities doppler  02/19/2011    right abi 0.98,left 0.97  . Renal doppler  02/12/2011    abd aorta prox 2.6 x 3.1 cm,mid 4.1 x 3.6 cm, distal 2.3 x 3.4 cm, right renal artery 1-59% left renal artery norm. ,both renal size normal  . Renal doppler  02/11/2010    infrarenal AAA mearsuring 3.6 x 3.7 cm  . Appendectomy    . Cholecystectomy    . Eye surgery    . Spine surgery    .  Cesarean section      History  Substance Use Topics  . Smoking status: Former Games developer  . Smokeless tobacco: Never Used  . Alcohol Use: No    Family History  Problem Relation Age of Onset  . Stroke Mother   . Heart attack Father   . Lung cancer Sister   . Bone cancer Sister     Allergies  Allergen Reactions  . Clarithromycin Other (See Comments)    Unknown   . Codeine Other (See Comments)    Unknown   . Penicillins Other (See Comments)    Unknown   . Tequin [Gatifloxacin] Other (See Comments)    Unknown     Medication list has been reviewed and updated.  Current Outpatient  Prescriptions on File Prior to Visit  Medication Sig Dispense Refill  . aspirin 81 MG tablet Take 81 mg by mouth daily.      . Biotin 5000 MCG CAPS Take 5,000 mcg by mouth daily.      Marland Kitchen docusate sodium (COLACE) 100 MG capsule Take 100 mg by mouth daily with supper.      . docusate sodium (ENEMEEZ) 283 MG enema Place 1 enema (283 mg total) rectally daily as needed.  30 each  0  . fish oil-omega-3 fatty acids 1000 MG capsule Take 2 g by mouth daily.      . furosemide (LASIX) 20 MG tablet Take 20 mg by mouth daily.      Marland Kitchen gabapentin (NEURONTIN) 400 MG capsule Take 400 mg by mouth 3 (three) times daily.      . isosorbide mononitrate (IMDUR) 30 MG 24 hr tablet Take 1 tablet (30 mg total) by mouth daily.  30 tablet  0  . levothyroxine (SYNTHROID, LEVOTHROID) 75 MCG tablet Take 1 tablet (75 mcg total) by mouth daily before breakfast.  30 tablet  0  . metoprolol succinate (TOPROL-XL) 50 MG 24 hr tablet Take 1 tablet (50 mg total) by mouth daily. Take with or immediately following a meal.  30 tablet  0  . mirabegron ER (MYRBETRIQ) 50 MG TB24 Take 50 mg by mouth daily.      . nitrofurantoin (MACRODANTIN) 100 MG capsule Take 1 capsule by mouth every evening.      Marland Kitchen oxyCODONE (OXYCONTIN) 10 MG 12 hr tablet Take 10 mg by mouth every 12 (twelve) hours.      Marland Kitchen oxyCODONE-acetaminophen (PERCOCET/ROXICET) 5-325 MG per tablet Take 1 tablet by mouth every 8 (eight) hours as needed for pain.      . potassium chloride (K-DUR,KLOR-CON) 10 MEQ tablet Take 10 mEq by mouth every other day.      . traZODone (DESYREL) 50 MG tablet Take 100 mg by mouth at bedtime.      Marland Kitchen venlafaxine XR (EFFEXOR-XR) 75 MG 24 hr capsule Take 75 mg by mouth 2 (two) times daily.      . Vitamin D, Ergocalciferol, (DRISDOL) 50000 UNITS CAPS Take 50,000 Units by mouth every 7 (seven) days.      . zaleplon (SONATA) 10 MG capsule Take 10 mg by mouth at bedtime.       No current facility-administered medications on file prior to visit.    Review  of Systems:  As per HPI, otherwise negative.    Physical Examination: Filed Vitals:   07/28/13 1236  BP: 122/76  Pulse: 81  Temp: 98 F (36.7 C)  Resp: 17   Filed Vitals:   07/28/13 1236  Height: 5\' 4"  (1.626 m)  Weight:  133 lb (60.328 kg)   Body mass index is 22.82 kg/(m^2). Ideal Body Weight: Weight in (lb) to have BMI = 25: 145.3   GEN: WDWN, NAD, Non-toxic, Alert & Oriented x 3 HEENT: 1.5 cm laceration right parietal area.  , Normocephalic.  Ears and Nose: No external deformity. EXTR: No clubbing/cyanosis/edema NEURO: Normal gait with assistance.  PRRERLA. EOMI gross motor and cerebellar intact  PSYCH: Normally interactive. Conversant. Not depressed or anxious appearing.  Calm demeanor.    Assessment and Plan: Laceration scalp Staples Up to date on tetanus   Signed,  Phillips Odor, MD

## 2013-08-09 ENCOUNTER — Ambulatory Visit (INDEPENDENT_AMBULATORY_CARE_PROVIDER_SITE_OTHER): Payer: Medicare Other | Admitting: Physician Assistant

## 2013-08-09 VITALS — BP 106/72 | HR 91 | Temp 97.9°F | Resp 17 | Ht 64.0 in | Wt 133.0 lb

## 2013-08-09 DIAGNOSIS — S0190XD Unspecified open wound of unspecified part of head, subsequent encounter: Secondary | ICD-10-CM

## 2013-08-09 DIAGNOSIS — Z5189 Encounter for other specified aftercare: Secondary | ICD-10-CM

## 2013-08-09 NOTE — Progress Notes (Signed)
  Subjective:    Patient ID: Chelsea Lynch, female    DOB: [birth year], 79 y.o.   MRN: [id number]  HPI 79 year old female presents for staple removal. DOI 07/28/13. Doing well without issues or complaints. Does admit it bled overnight after the initial injury, but that stopped and since she has had no problems.  No headaches or dizziness. Otherwise doing well without issue or complaint.     Review of Systems  Constitutional: Negative for fever and chills.  Skin: Positive for wound.  Neurological: Negative for dizziness and headaches.       Objective:   Physical Exam  Constitutional: She is oriented to person, place, and time. She appears well-developed and well-nourished.  HENT:  Head: Normocephalic and atraumatic.  Right Ear: External ear normal.  Left Ear: External ear normal.  Eyes: Conjunctivae are normal.  Neck: Normal range of motion.  Neurological: She is alert and oriented to person, place, and time.  Skin:  Well healing wound on right occipital region.  No erythema, warmth, or drainage     #2 staples removed without difficulty. Patient tolerated well.      Assessment & Plan:  Wound, open, head, subsequent encounter  Wound well-healing Small scab in place. Wound care provided Follow up as needed.

## 2013-08-14 ENCOUNTER — Ambulatory Visit (INDEPENDENT_AMBULATORY_CARE_PROVIDER_SITE_OTHER): Payer: Medicare Other | Admitting: Cardiology

## 2013-08-14 ENCOUNTER — Encounter: Payer: Self-pay | Admitting: Cardiology

## 2013-08-14 VITALS — BP 112/72 | HR 72 | Ht 65.0 in | Wt 137.9 lb

## 2013-08-14 DIAGNOSIS — R079 Chest pain, unspecified: Secondary | ICD-10-CM

## 2013-08-14 DIAGNOSIS — I4891 Unspecified atrial fibrillation: Secondary | ICD-10-CM

## 2013-08-14 DIAGNOSIS — I498 Other specified cardiac arrhythmias: Secondary | ICD-10-CM

## 2013-08-14 DIAGNOSIS — I480 Paroxysmal atrial fibrillation: Secondary | ICD-10-CM

## 2013-08-14 DIAGNOSIS — I471 Supraventricular tachycardia: Secondary | ICD-10-CM

## 2013-08-14 MED ORDER — FUROSEMIDE 20 MG PO TABS: ORAL_TABLET | ORAL | Status: DC

## 2013-08-14 NOTE — Patient Instructions (Addendum)
Your physician has recommended you make the following change in your medication stop ISOSORBIDE MONO, Take furosemide 20 mg  Every day if weight is > 3 lbs of dry weight take another tablet that day.    Your physician wants you to follow-up in 6 months Dr Herbie Baltimore.You will receive a reminder letter in the mail two months in advance. If you don't receive a letter, please call our office to schedule the follow-up appointment.

## 2013-08-27 ENCOUNTER — Encounter: Payer: Self-pay | Admitting: Cardiology

## 2013-08-27 NOTE — Assessment & Plan Note (Signed)
As far as I can tell, I don't know the last time she's had a true episode of atrial fibrillation. With this in mind, and with her unsteady gait, again we discussed it between myself and her daughter and we saw her felt that at this particular state referred not to pursue anticoagulation. We can adjust it further out she gets her hospitalization and the more stable she becomes. I would also like to see more evidence that she has recurrent A. fib.

## 2013-08-27 NOTE — Assessment & Plan Note (Signed)
She certainly had a prolonged episode in relation to septic shock. She doesn't have sure we'll episodes persistently now. But they seem to break with holding her breath her coughing which would be more suggestive of SVT/PAT than PAF. She is actually on twice a day dose of Toprol which she splits to 50 mg twice a day in order to avoid her orthostatic symptoms. Plan: For now continue with current dose of beta blocker as prescribed. If symptoms do get worse, we may need to increase the beta blocker dosing.

## 2013-08-27 NOTE — Progress Notes (Signed)
PCP: [name], MD  Clinic Note: Chief Complaint  Patient presents with  . 3 month visit    no chest pain ,sob , no edema, memory issues    HPI: Chelsea Lynch is a 79 y.o. female with a PMH below who presents today for will followup. I last saw her back in June.. This was a followup from a hospitalization back in April where she presented with UTI and developed septic shock and gram-negative bacteremia as well as aspiration pneumonia. She developed an episode of supraventricular tachycardia and an elevated troponin levels that was thought to be a type II MI, not ACS related and more related to the SVT.  She sees her recovered well from that episode after a prolonged hospital stay complicated by narcotic withdrawal related delirium. With the concern of fall risk due to unsteady gait, she has not been considered a good warfarin candidate for PAF (which is at best a presumed diagnosis).  To evaluate the type II MI just to ensure there was no significant coronary lesions, she's undergone a Myoview stress test --noted below in the [facility] section.  Interval History: She is due to finish her time with home health working with her. She says she she's has episodes of palpitations that lasted 1-2 minutes but less often than they used to. She says she holds her breath or coughs and a break. She does get occasionally dizzy with this but is less often there had been. Sheaths continues to have mild edema bilaterally, and is essentially taking Lasix when necessary now. She thinks her swelling is down a little but worse now. She really denies any PND or orthopnea associated with it however. She also denies any chest pain/pressure or shortness of breath with rest or exertion. She denies any prolonged tachycardia spells. Intermittently lightheaded and dizzy somewhat orthostatic but not frequently. No TIA or amaurosis fugax symptoms.  Melena - no, hematochezia no; hematuria - no; nosebleeds - no;  claudication - no  Past Medical History  Diagnosis Date  . GERD (gastroesophageal reflux disease)   . Depression   . Neuropathy   . Constipation, chronic   . Insomnia   . Chronic pain   . Hypothyroid   . Anxiety   . Stroke   . Frequent falls   . PAF (paroxysmal atrial fibrillation)     not on anticoagulation due to a fall risk, wore an Event monitor 02//23/11-03/03/11  . PAT (paroxysmal atrial tachycardia)     Supraventricular tachycardia; most recent episode associated with sepsis  . Hypertension   . AAA (abdominal aortic aneurysm) without rupture     Followed by routine Dopplers    history of labile blood pressures  Prior Cardiac Evaluation and Past Surgical History: Past Surgical History  Procedure Laterality Date  . Nm myocar perf wall motion  February 06, 2010    normal, EF 77%  . Nm myocar perf wall motion  04/26/2012    Lexiscan -small mostly fixed septal artifact,no evidence of infarct or ischemia  . Doppler echocardiography  03/2013    Normal EF 66 5%. Mild/moderate aortic regurgitation, mildly dilated RV with moderate pulmonary hypertension  . Abdomnal aorta  02/25/2012    mid abd. aorta  mid segment diltaton 3.93 x 4.55 cm greatest diameter.mild amt atherosclerosis without evidence of sigificant diamter reduction  . Lower arterial extremities doppler  02/19/2011    right abi 0.98,left 0.97  . Renal doppler  02/12/2011    abd aorta prox 2.6 x  3.1 cm,mid 4.1 x 3.6 cm, distal 2.3 x 3.4 cm, right renal artery 1-59% left renal artery norm. ,both renal size normal  . Renal doppler  02/11/2010    infrarenal AAA mearsuring 3.6 x 3.7 cm    Allergies  Allergen Reactions  . Clarithromycin Other (See Comments)    Unknown   . Codeine Other (See Comments)    Unknown   . Penicillins Other (See Comments)    Unknown   . Tequin [Gatifloxacin] Other (See Comments)    Unknown     Current Outpatient Prescriptions  Medication Sig Dispense Refill  . aspirin 81 MG tablet  Take 81 mg by mouth daily.      . Biotin 5000 MCG CAPS Take 5,000 mcg by mouth daily.      Marland Kitchen desonide (DESOWEN) 0.05 % cream       . docusate sodium (COLACE) 100 MG capsule Take 100 mg by mouth daily with supper.      . docusate sodium (ENEMEEZ) 283 MG enema Place 1 enema (283 mg total) rectally daily as needed.  30 each  0  . fish oil-omega-3 fatty acids 1000 MG capsule Take 2 g by mouth daily.      Marland Kitchen gabapentin (NEURONTIN) 400 MG capsule Take 400 mg by mouth 3 (three) times daily.      Marland Kitchen levothyroxine (SYNTHROID, LEVOTHROID) 75 MCG tablet Take 1 tablet (75 mcg total) by mouth daily before breakfast.  30 tablet  0  . metoprolol succinate (TOPROL-XL) 50 MG 24 hr tablet Take 25 mg by mouth daily. Take with or immediately following a meal.      . mirabegron ER (MYRBETRIQ) 50 MG TB24 Take 50 mg by mouth daily.      . nitrofurantoin (MACRODANTIN) 100 MG capsule Take 1 capsule by mouth every evening.      Marland Kitchen oxyCODONE (OXYCONTIN) 10 MG 12 hr tablet Take 10 mg by mouth every 12 (twelve) hours.      Marland Kitchen oxyCODONE-acetaminophen (PERCOCET/ROXICET) 5-325 MG per tablet Take 1 tablet by mouth every 8 (eight) hours as needed for pain.      . potassium chloride (K-DUR,KLOR-CON) 10 MEQ tablet Take 10 mEq by mouth every other day.      . traZODone (DESYREL) 50 MG tablet Take 100 mg by mouth at bedtime.      Marland Kitchen venlafaxine XR (EFFEXOR-XR) 75 MG 24 hr capsule Take 75 mg by mouth 2 (two) times daily.      . Vitamin D, Ergocalciferol, (DRISDOL) 50000 UNITS CAPS Take 50,000 Units by mouth every 7 (seven) days.      . zaleplon (SONATA) 10 MG capsule Take 10 mg by mouth at bedtime.      . furosemide (LASIX) 20 MG tablet Take 1 to 2 tablet by mouth a day as directed  60 tablet  11   No current facility-administered medications for this visit.    History   Social History Narrative   She is a married mother of 2, grandmother 4 with now 2 great-grandchildren. She routinely exercises, currently using the assistance of  home health PT. She does use a walker. She is always accompanied by her daughter, who does say that she is not all that functional sports getting around the house and being much more than just mild activities.   She does not smoke or drink alcohol.    ROS: A comprehensive Review of Systems - Negative except Pertinent positives noted above. She apparently had a TIA while in the  hospital, but no residual effect. getting sstronger every day, bbut still remains somewhat weak and unteady.  No further dysuria or hematuria.  PHYSICAL EXAM BP 112/72  Pulse 72  Ht 5\' 5"  (1.651 m)  Wt 137 lb 14.4 oz (62.551 kg)  BMI 22.95 kg/m2 General appearance: alert, cooperative, appears stated age, no distress and Normal mood and affect. She is well-groomed well-nourished. She answers questions appropriately, and is more forthcoming today..  Neck: no adenopathy, no carotid bruit, no JVD, supple, symmetrical, trachea midline and thyroid not enlarged, symmetric, no tenderness/mass/nodules  Lungs: CTA B., normal percussion bilaterally and Mild interstitial sounds but nothing significant. No W./R./R.  Heart: regularly irregular rhythm, S1, S2 normal, systolic murmur: systolic ejection 2/6, crescendo, decrescendo and harsh at 2nd right intercostal space, radiates to carotids, no click and no rub  Abdomen: soft, non-tender; bowel sounds normal; no masses, no organomegaly  Extremities: extremities normal, atraumatic, no cyanosis or edema, no edema, redness or tenderness in the calves or thighs, no ulcers, gangrene or trophic changes and varicose veins noted  Pulses: 2+ and symmetric  Neurologic: Mental status: Alert, oriented, thought content appropriate, affect: normal  HEENT: [place]/AT, EOMI, MMM, anicteric sclera  [id number] today: Yes Rate: 72 , Rhythm: Sinus rhythm with sinus arrhythmia  Recent Labs: None new  ASSESSMENT / PLAN: SVT (supraventricular tachycardia)  She certainly had a prolonged episode in  relation to septic shock. She doesn't have sure we'll episodes persistently now. But they seem to break with holding her breath her coughing which would be more suggestive of SVT/PAT than PAF. She is actually on twice a day dose of Toprol which she splits to 50 mg twice a day in order to avoid her orthostatic symptoms. Plan: For now continue with current dose of beta blocker as prescribed. If symptoms do get worse, we may need to increase the beta blocker dosing.  PAF (paroxysmal atrial fibrillation) As far as I can tell, I don't know the last time she's had a true episode of atrial fibrillation. With this in mind, and with her unsteady gait, again we discussed it between myself and her daughter and we saw her felt that at this particular state referred not to pursue anticoagulation. We can adjust it further out she gets her hospitalization and the more stable she becomes. I would also like to see more evidence that she has recurrent A. fib.  Chest pain with moderate risk for cardiac etiology He yet again, atypical chest discomfort and this time positive troponins appear to be more likely to be noncardiac in nature. Troponin elevation was clearly related to prolonged tachycardia with potentially existing small vessel disease but not macrovascular disease.    I will defer management of her other cardiac risk factors including potentially dyslipidemia or glucose intolerance to her primary physician.  Orders Placed This Encounter  Procedures  . EKG 12-Lead   Meds ordered this encounter  Medications  . DISCONTD: potassium chloride (K-DUR) 10 MEQ tablet    Sig:   . desonide (DESOWEN) 0.05 % cream    Sig:   . metoprolol succinate (TOPROL-XL) 50 MG 24 hr tablet    Sig: Take 25 mg by mouth daily. Take with or immediately following a meal.  . furosemide (LASIX) 20 MG tablet    Sig: Take 1 to 2 tablet by mouth a day as directed    Dispense:  60 tablet    Refill:  11    Followup: 6  months  Martine Bleecker W. Herbie Baltimore, M.D.,  M.S. [facility] [address] [place], [place]  [address]  [phone] Pager # [phone]

## 2013-08-27 NOTE — Assessment & Plan Note (Signed)
He yet again, atypical chest discomfort and this time positive troponins appear to be more likely to be noncardiac in nature. Troponin elevation was clearly related to prolonged tachycardia with potentially existing small vessel disease but not macrovascular disease.

## 2013-11-08 ENCOUNTER — Inpatient Hospital Stay (HOSPITAL_COMMUNITY)
Admission: EM | Admit: 2013-11-08 | Discharge: 2013-11-10 | DRG: 065 | Disposition: A | Discharge status: Home Health | Payer: Medicare Other | Attending: Internal Medicine | Admitting: Internal Medicine

## 2013-11-08 ENCOUNTER — Emergency Department (HOSPITAL_COMMUNITY): Payer: Medicare Other

## 2013-11-08 ENCOUNTER — Encounter (HOSPITAL_COMMUNITY): Payer: Self-pay | Admitting: Emergency Medicine

## 2013-11-08 DIAGNOSIS — G8929 Other chronic pain: Secondary | ICD-10-CM | POA: Diagnosis present

## 2013-11-08 DIAGNOSIS — Z7982 Long term (current) use of aspirin: Secondary | ICD-10-CM

## 2013-11-08 DIAGNOSIS — R1310 Dysphagia, unspecified: Secondary | ICD-10-CM

## 2013-11-08 DIAGNOSIS — M545 Low back pain, unspecified: Secondary | ICD-10-CM | POA: Diagnosis present

## 2013-11-08 DIAGNOSIS — F411 Generalized anxiety disorder: Secondary | ICD-10-CM | POA: Diagnosis present

## 2013-11-08 DIAGNOSIS — R9389 Abnormal findings on diagnostic imaging of other specified body structures: Secondary | ICD-10-CM

## 2013-11-08 DIAGNOSIS — R079 Chest pain, unspecified: Secondary | ICD-10-CM

## 2013-11-08 DIAGNOSIS — R7881 Bacteremia: Secondary | ICD-10-CM

## 2013-11-08 DIAGNOSIS — R932 Abnormal findings on diagnostic imaging of liver and biliary tract: Secondary | ICD-10-CM

## 2013-11-08 DIAGNOSIS — I480 Paroxysmal atrial fibrillation: Secondary | ICD-10-CM

## 2013-11-08 DIAGNOSIS — G459 Transient cerebral ischemic attack, unspecified: Secondary | ICD-10-CM

## 2013-11-08 DIAGNOSIS — E785 Hyperlipidemia, unspecified: Secondary | ICD-10-CM | POA: Diagnosis present

## 2013-11-08 DIAGNOSIS — K219 Gastro-esophageal reflux disease without esophagitis: Secondary | ICD-10-CM | POA: Diagnosis present

## 2013-11-08 DIAGNOSIS — Z87891 Personal history of nicotine dependence: Secondary | ICD-10-CM

## 2013-11-08 DIAGNOSIS — D649 Anemia, unspecified: Secondary | ICD-10-CM

## 2013-11-08 DIAGNOSIS — J690 Pneumonitis due to inhalation of food and vomit: Secondary | ICD-10-CM

## 2013-11-08 DIAGNOSIS — F329 Major depressive disorder, single episode, unspecified: Secondary | ICD-10-CM | POA: Diagnosis present

## 2013-11-08 DIAGNOSIS — Z66 Do not resuscitate: Secondary | ICD-10-CM | POA: Diagnosis present

## 2013-11-08 DIAGNOSIS — Z9181 History of falling: Secondary | ICD-10-CM

## 2013-11-08 DIAGNOSIS — Z823 Family history of stroke: Secondary | ICD-10-CM

## 2013-11-08 DIAGNOSIS — I10 Essential (primary) hypertension: Secondary | ICD-10-CM

## 2013-11-08 DIAGNOSIS — M549 Dorsalgia, unspecified: Secondary | ICD-10-CM

## 2013-11-08 DIAGNOSIS — G8190 Hemiplegia, unspecified affecting unspecified side: Secondary | ICD-10-CM | POA: Diagnosis present

## 2013-11-08 DIAGNOSIS — K5901 Slow transit constipation: Secondary | ICD-10-CM

## 2013-11-08 DIAGNOSIS — I471 Supraventricular tachycardia: Secondary | ICD-10-CM

## 2013-11-08 DIAGNOSIS — F1123 Opioid dependence with withdrawal: Secondary | ICD-10-CM

## 2013-11-08 DIAGNOSIS — K5730 Diverticulosis of large intestine without perforation or abscess without bleeding: Secondary | ICD-10-CM

## 2013-11-08 DIAGNOSIS — I359 Nonrheumatic aortic valve disorder, unspecified: Secondary | ICD-10-CM

## 2013-11-08 DIAGNOSIS — E039 Hypothyroidism, unspecified: Secondary | ICD-10-CM | POA: Diagnosis present

## 2013-11-08 DIAGNOSIS — I714 Abdominal aortic aneurysm, without rupture, unspecified: Secondary | ICD-10-CM | POA: Diagnosis present

## 2013-11-08 DIAGNOSIS — I4891 Unspecified atrial fibrillation: Secondary | ICD-10-CM | POA: Diagnosis present

## 2013-11-08 DIAGNOSIS — I639 Cerebral infarction, unspecified: Secondary | ICD-10-CM | POA: Diagnosis present

## 2013-11-08 DIAGNOSIS — I6350 Cerebral infarction due to unspecified occlusion or stenosis of unspecified cerebral artery: Principal | ICD-10-CM

## 2013-11-08 DIAGNOSIS — F3289 Other specified depressive episodes: Secondary | ICD-10-CM | POA: Diagnosis present

## 2013-11-08 DIAGNOSIS — N189 Chronic kidney disease, unspecified: Secondary | ICD-10-CM

## 2013-11-08 DIAGNOSIS — K2900 Acute gastritis without bleeding: Secondary | ICD-10-CM

## 2013-11-08 DIAGNOSIS — K5900 Constipation, unspecified: Secondary | ICD-10-CM | POA: Diagnosis present

## 2013-11-08 DIAGNOSIS — A419 Sepsis, unspecified organism: Secondary | ICD-10-CM

## 2013-11-08 HISTORY — DX: Headache: R51

## 2013-11-08 HISTORY — DX: Unspecified dementia, unspecified severity, without behavioral disturbance, psychotic disturbance, mood disturbance, and anxiety: F03.90

## 2013-11-08 HISTORY — DX: Pain in leg, unspecified: M79.606

## 2013-11-08 HISTORY — DX: Low back pain, unspecified: M54.50

## 2013-11-08 HISTORY — DX: Sleep apnea, unspecified: G47.30

## 2013-11-08 HISTORY — DX: Shortness of breath: R06.02

## 2013-11-08 HISTORY — DX: Pneumonia, unspecified organism: J18.9

## 2013-11-08 HISTORY — DX: Unspecified chronic bronchitis: J42

## 2013-11-08 HISTORY — DX: Other chronic pain: G89.29

## 2013-11-08 HISTORY — DX: Adverse effect of unspecified anesthetic, initial encounter: T41.45XA

## 2013-11-08 HISTORY — DX: Other complications of anesthesia, initial encounter: T88.59XA

## 2013-11-08 HISTORY — DX: Low back pain: M54.5

## 2013-11-08 HISTORY — DX: Spinal stenosis, site unspecified: M48.00

## 2013-11-08 HISTORY — DX: Unspecified osteoarthritis, unspecified site: M19.90

## 2013-11-08 HISTORY — DX: Reserved for concepts with insufficient information to code with codable children: IMO0002

## 2013-11-08 LAB — CBC
HCT: 37.7 % (ref 36.0–46.0)
Hemoglobin: 13 g/dL (ref 12.0–15.0)
MCV: 88.7 fL (ref 78.0–100.0)
RBC: 4.25 MIL/uL (ref 3.87–5.11)
RDW: 13.7 % (ref 11.5–15.5)
WBC: 6.8 10*3/uL (ref 4.0–10.5)

## 2013-11-08 LAB — URINALYSIS, ROUTINE W REFLEX MICROSCOPIC
Bilirubin Urine: NEGATIVE
Hgb urine dipstick: NEGATIVE
Leukocytes, UA: NEGATIVE
Specific Gravity, Urine: 1.007 (ref 1.005–1.030)
Urobilinogen, UA: 0.2 mg/dL (ref 0.0–1.0)

## 2013-11-08 LAB — COMPREHENSIVE METABOLIC PANEL
ALT: 12 U/L (ref 0–35)
Albumin: 3.8 g/dL (ref 3.5–5.2)
BUN: 18 mg/dL (ref 6–23)
CO2: 29 mEq/L (ref 19–32)
Calcium: 9.3 mg/dL (ref 8.4–10.5)
Creatinine, Ser: 1.1 mg/dL (ref 0.50–1.10)
GFR calc Af Amer: 51 mL/min — ABNORMAL LOW (ref >90)
GFR calc non Af Amer: 44 mL/min — ABNORMAL LOW (ref >90)
Glucose, Bld: 93 mg/dL (ref 70–99)

## 2013-11-08 LAB — POCT I-STAT TROPONIN I

## 2013-11-08 LAB — DIFFERENTIAL
Eosinophils Relative: 5 % (ref 0–5)
Lymphocytes Relative: 17 % (ref 12–46)
Lymphs Abs: 1.1 10*3/uL (ref 0.7–4.0)
Monocytes Absolute: 0.7 10*3/uL (ref 0.1–1.0)
Monocytes Relative: 10 % (ref 3–12)

## 2013-11-08 LAB — GLUCOSE, CAPILLARY

## 2013-11-08 MED ORDER — SODIUM CHLORIDE 0.9 % IJ SOLN
3.0000 mL | Freq: Two times a day (BID) | INTRAMUSCULAR | Status: DC
  Administered 2013-11-08 – 2013-11-09 (×2): 3 mL via INTRAVENOUS

## 2013-11-08 MED ORDER — MIRABEGRON ER 50 MG PO TB24
50.0000 mg | ORAL_TABLET | Freq: Every evening | ORAL | Status: DC
  Administered 2013-11-08 – 2013-11-09 (×2): 50 mg via ORAL
  Filled 2013-11-08 (×3): qty 1

## 2013-11-08 MED ORDER — BISACODYL 5 MG PO TBEC
5.0000 mg | DELAYED_RELEASE_TABLET | Freq: Every day | ORAL | Status: DC | PRN
  Filled 2013-11-08: qty 1

## 2013-11-08 MED ORDER — FUROSEMIDE 20 MG PO TABS
20.0000 mg | ORAL_TABLET | Freq: Every morning | ORAL | Status: DC
  Administered 2013-11-09 – 2013-11-10 (×2): 20 mg via ORAL
  Filled 2013-11-08 (×2): qty 1

## 2013-11-08 MED ORDER — BISACODYL 10 MG RE SUPP: 10.0000 mg | Freq: Every day | RECTAL | Status: DC | PRN

## 2013-11-08 MED ORDER — VENLAFAXINE HCL ER 75 MG PO CP24
75.0000 mg | ORAL_CAPSULE | Freq: Two times a day (BID) | ORAL | Status: DC
  Administered 2013-11-08 – 2013-11-10 (×4): 75 mg via ORAL
  Filled 2013-11-08 (×5): qty 1

## 2013-11-08 MED ORDER — LEVOTHYROXINE SODIUM 75 MCG PO TABS
75.0000 ug | ORAL_TABLET | Freq: Every day | ORAL | Status: DC
  Administered 2013-11-09 – 2013-11-10 (×2): 75 ug via ORAL
  Filled 2013-11-08 (×4): qty 1

## 2013-11-08 MED ORDER — DOCUSATE SODIUM 100 MG PO CAPS
100.0000 mg | ORAL_CAPSULE | Freq: Every day | ORAL | Status: DC
  Administered 2013-11-08 – 2013-11-09 (×2): 100 mg via ORAL
  Filled 2013-11-08 (×3): qty 1

## 2013-11-08 MED ORDER — TRAZODONE HCL 100 MG PO TABS
100.0000 mg | ORAL_TABLET | Freq: Every day | ORAL | Status: DC
  Administered 2013-11-08 – 2013-11-09 (×2): 100 mg via ORAL
  Filled 2013-11-08 (×3): qty 1

## 2013-11-08 MED ORDER — IBUPROFEN 800 MG PO TABS
800.0000 mg | ORAL_TABLET | Freq: Once | ORAL | Status: AC
  Administered 2013-11-08: 800 mg via ORAL
  Filled 2013-11-08: qty 1

## 2013-11-08 MED ORDER — SODIUM CHLORIDE 0.9 % IJ SOLN: 3.0000 mL | INTRAMUSCULAR | Status: DC | PRN

## 2013-11-08 MED ORDER — ZOLPIDEM TARTRATE 5 MG PO TABS: 5.0000 mg | ORAL_TABLET | Freq: Every evening | ORAL | Status: DC | PRN

## 2013-11-08 MED ORDER — VITAMIN D (ERGOCALCIFEROL) 1.25 MG (50000 UNIT) PO CAPS: 50000.0000 [IU] | ORAL_CAPSULE | ORAL | Status: DC

## 2013-11-08 MED ORDER — SODIUM CHLORIDE 0.9 % IV SOLN: 250.0000 mL | INTRAVENOUS | Status: DC | PRN

## 2013-11-08 MED ORDER — GABAPENTIN 400 MG PO CAPS
400.0000 mg | ORAL_CAPSULE | Freq: Three times a day (TID) | ORAL | Status: DC
  Administered 2013-11-08 – 2013-11-10 (×6): 400 mg via ORAL
  Filled 2013-11-08 (×8): qty 1

## 2013-11-08 MED ORDER — OXYCODONE HCL ER 10 MG PO T12A
10.0000 mg | EXTENDED_RELEASE_TABLET | Freq: Two times a day (BID) | ORAL | Status: DC
  Administered 2013-11-08 – 2013-11-10 (×4): 10 mg via ORAL
  Filled 2013-11-08 (×8): qty 1

## 2013-11-08 MED ORDER — NITROFURANTOIN MACROCRYSTAL 100 MG PO CAPS
100.0000 mg | ORAL_CAPSULE | Freq: Every evening | ORAL | Status: DC
  Administered 2013-11-08 – 2013-11-09 (×2): 100 mg via ORAL
  Filled 2013-11-08 (×3): qty 1

## 2013-11-08 MED ORDER — POTASSIUM CHLORIDE CRYS ER 10 MEQ PO TBCR
10.0000 meq | EXTENDED_RELEASE_TABLET | ORAL | Status: DC
  Administered 2013-11-09: 10 meq via ORAL
  Filled 2013-11-08: qty 1

## 2013-11-08 MED ORDER — ASPIRIN EC 325 MG PO TBEC
325.0000 mg | DELAYED_RELEASE_TABLET | Freq: Every day | ORAL | Status: DC
Start: 2013-11-08 — End: 2013-11-09
  Administered 2013-11-08 – 2013-11-09 (×2): 325 mg via ORAL
  Filled 2013-11-08 (×2): qty 1

## 2013-11-08 MED ORDER — ENOXAPARIN SODIUM 40 MG/0.4ML ~~LOC~~ SOLN
40.0000 mg | SUBCUTANEOUS | Status: DC
  Administered 2013-11-08 – 2013-11-09 (×2): 40 mg via SUBCUTANEOUS
  Filled 2013-11-08 (×3): qty 0.4

## 2013-11-08 NOTE — H&P (Signed)
PCP:   [name], MD   Chief Complaint:  Left side weakness  HPI: 79 year old female with a history of hypertension, paroxysmal atrial fibrillation, AAA, hypothyroidism came to the ED with chief complaint of left side weakness including the left arm and leg along with some left facial weakness. As per patient's daughter the symptoms started yesterday at 5 PM. There was no slurred speech no seizure like activity no headache no double vision there was no difficulty swallowing. Patient did not have any chest pain no shortness of breath no nausea vomiting or diarrhea. Patient has a history of TIA in the past. In the ED, CT of the head showed questionable recent infarct involving the head of the caudate nucleus on the right and a portion of the anterior right globus pallidum.  Allergies:   Allergies  Allergen Reactions  . Clarithromycin Other (See Comments)    Unknown   . Codeine Other (See Comments)    Unknown   . Penicillins Other (See Comments)    Unknown   . Tequin [Gatifloxacin] Other (See Comments)    Unknown       Past Medical History  Diagnosis Date  . GERD (gastroesophageal reflux disease)   . Depression   . Neuropathy   . Constipation, chronic   . Insomnia   . Chronic pain   . Hypothyroid   . Anxiety   . Stroke   . Frequent falls   . PAF (paroxysmal atrial fibrillation)     not on anticoagulation due to a fall risk, wore an Event monitor 02//23/11-03/03/11  . PAT (paroxysmal atrial tachycardia)     Supraventricular tachycardia; most recent episode associated with sepsis  . Hypertension   . AAA (abdominal aortic aneurysm) without rupture     Followed by routine Dopplers    Past Surgical History  Procedure Laterality Date  . Nm myocar perf wall motion  June 2014    No ischemia or infarction, mild apical breast attenuation, EF roughly 70%  . Doppler echocardiography  03/2013    Normal EF 66 5%. Mild/moderate aortic regurgitation, mildly dilated RV  with moderate pulmonary hypertension  . Abdomnal aorta  02/25/2012    mid abd. aorta  mid segment diltaton 3.93 x 4.55 cm greatest diameter.mild amt atherosclerosis without evidence of sigificant diamter reduction  . Lower arterial extremities doppler  02/19/2011    right abi 0.98,left 0.97  . Renal doppler  02/12/2011    abd aorta prox 2.6 x 3.1 cm,mid 4.1 x 3.6 cm, distal 2.3 x 3.4 cm, right renal artery 1-59% left renal artery norm. ,both renal size normal  . Renal doppler  02/11/2010    infrarenal AAA mearsuring 3.6 x 3.7 cm  . Appendectomy    . Cholecystectomy    . Eye surgery    . Spine surgery    . Cesarean section      Prior to Admission medications   Medication Sig Start Date End Date Taking? Authorizing Provider  aspirin 81 MG tablet Take 81 mg by mouth at bedtime.    Yes Historical Provider, MD  Biotin 5000 MCG CAPS Take 5,000 mcg by mouth daily.   Yes Historical Provider, MD  bisacodyl (DULCOLAX) 10 MG suppository Place 10 mg rectally daily as needed for mild constipation or moderate constipation.   Yes Historical Provider, MD  bisacodyl (DULCOLAX) 5 MG EC tablet Take 5 mg by mouth daily as needed for moderate constipation.   Yes Historical Provider, MD  docusate sodium (COLACE) 100 MG  capsule Take 100 mg by mouth daily with supper.   Yes Historical Provider, MD  fish oil-omega-3 fatty acids 1000 MG capsule Take 2 g by mouth every morning.    Yes Historical Provider, MD  furosemide (LASIX) 20 MG tablet Take 20 mg by mouth every morning.   Yes Historical Provider, MD  gabapentin (NEURONTIN) 400 MG capsule Take 400 mg by mouth 3 (three) times daily.   Yes Historical Provider, MD  Ibuprofen (ADVIL PO) Take 2 tablets by mouth 2 (two) times daily as needed (for pain).   Yes Historical Provider, MD  levothyroxine (SYNTHROID, LEVOTHROID) 75 MCG tablet Take 1 tablet (75 mcg total) by mouth daily before breakfast. 04/06/13  Yes Jessica U Vann, DO  MELATONIN PO Take 1 tablet by mouth at  bedtime as needed (for sleep).   Yes Historical Provider, MD  metoprolol succinate (TOPROL-XL) 50 MG 24 hr tablet Take 25 mg by mouth 2 (two) times daily. Take with or immediately following a meal. 04/06/13  Yes Joseph Art, DO  mirabegron ER (MYRBETRIQ) 50 MG TB24 Take 50 mg by mouth every evening.    Yes Historical Provider, MD  nitrofurantoin (MACRODANTIN) 100 MG capsule Take 1 capsule by mouth every evening. 04/26/13  Yes Historical Provider, MD  oxyCODONE (OXYCONTIN) 10 MG 12 hr tablet Take 10 mg by mouth every 12 (twelve) hours.   Yes Historical Provider, MD  potassium chloride (K-DUR,KLOR-CON) 10 MEQ tablet Take 10 mEq by mouth every other day.   Yes Historical Provider, MD  traZODone (DESYREL) 50 MG tablet Take 100 mg by mouth at bedtime.   Yes Historical Provider, MD  venlafaxine XR (EFFEXOR-XR) 75 MG 24 hr capsule Take 75 mg by mouth 2 (two) times daily.   Yes Historical Provider, MD  Vitamin D, Ergocalciferol, (DRISDOL) 50000 UNITS CAPS Take 50,000 Units by mouth every 7 (seven) days. wednesdays   Yes Historical Provider, MD  zaleplon (SONATA) 10 MG capsule Take 10 mg by mouth at bedtime.   Yes Historical Provider, MD    Social History:  reports that she quit smoking about 40 years ago. Her smoking use included Cigarettes. She smoked 0.00 packs per day. She has never used smokeless tobacco. She reports that she does not drink alcohol or use illicit drugs.  Family History  Problem Relation Age of Onset  . Stroke Mother   . Heart attack Father   . Lung cancer Sister   . Bone cancer Sister      All the positives are listed in BOLD  Review of Systems:  HEENT: Headache, blurred vision, runny nose, sore throat Neck: Hypothyroidism, hyperthyroidism,,lymphadenopathy Chest : Shortness of breath, history of COPD, Asthma Heart : Chest pain, history of coronary arterey disease GI:  Nausea, vomiting, diarrhea, constipation, GERD GU: Dysuria, urgency, frequency of urination,  hematuria Neuro: Stroke, seizures, syncope Psych: Depression, anxiety, hallucinations   Physical Exam: Blood pressure 177/84, pulse 67, temperature 98 F (36.7 C), temperature source Oral, resp. rate 17, SpO2 96.00%. Constitutional:   Patient is a well-developed and well-nourished female** in no acute distress and cooperative with exam. Head: Normocephalic and atraumatic Mouth: Mucus membranes moist Eyes: PERRL, EOMI, conjunctivae normal Neck: Supple, No Thyromegaly Cardiovascular: RRR, S1 normal, S2 normal Pulmonary/Chest: CTAB, no wheezes, rales, or rhonchi Abdominal: Soft. Non-tender, non-distended, bowel sounds are normal, no masses, organomegaly, or guarding present.  Neurological: A&O x3, Strenght is 4 x 5 in both left upper and lower extremity , cranial nerve II-XII are grossly intact, no  focal motor deficit, sensory intact to light touch bilaterally.  Extremities : No Cyanosis, Clubbing or Edema   Labs on Admission:  Results for orders placed during the hospital encounter of 11/08/13 (from the past 48 hour(s))  PROTIME-INR     Status: None   Collection Time    11/08/13 12:18 PM      Result Value Range   Prothrombin Time 12.0  11.6 - 15.2 seconds   INR 0.90  0.00 - 1.49  APTT     Status: None   Collection Time    11/08/13 12:18 PM      Result Value Range   aPTT 27  24 - 37 seconds  CBC     Status: None   Collection Time    11/08/13 12:18 PM      Result Value Range   WBC 6.8  4.0 - 10.5 K/uL   RBC 4.25  3.87 - 5.11 MIL/uL   Hemoglobin 13.0  12.0 - 15.0 g/dL   HCT 96.0  45.4 - 09.8 %   MCV 88.7  78.0 - 100.0 fL   MCH 30.6  26.0 - 34.0 pg   MCHC 34.5  30.0 - 36.0 g/dL   RDW 11.9  14.7 - 82.9 %   Platelets 214  150 - 400 K/uL  DIFFERENTIAL     Status: None   Collection Time    11/08/13 12:18 PM      Result Value Range   Neutrophils Relative % 69  43 - 77 %   Neutro Abs 4.7  1.7 - 7.7 K/uL   Lymphocytes Relative 17  12 - 46 %   Lymphs Abs 1.1  0.7 - 4.0 K/uL    Monocytes Relative 10  3 - 12 %   Monocytes Absolute 0.7  0.1 - 1.0 K/uL   Eosinophils Relative 5  0 - 5 %   Eosinophils Absolute 0.3  0.0 - 0.7 K/uL   Basophils Relative 1  0 - 1 %   Basophils Absolute 0.0  0.0 - 0.1 K/uL  COMPREHENSIVE METABOLIC PANEL     Status: Abnormal   Collection Time    11/08/13 12:18 PM      Result Value Range   Sodium 139  135 - 145 mEq/L   Potassium 3.7  3.5 - 5.1 mEq/L   Chloride 101  96 - 112 mEq/L   CO2 29  19 - 32 mEq/L   Glucose, Bld 93  70 - 99 mg/dL   BUN 18  6 - 23 mg/dL   Creatinine, Ser 5.62  0.50 - 1.10 mg/dL   Calcium 9.3  8.4 - 13.0 mg/dL   Total Protein 7.1  6.0 - 8.3 g/dL   Albumin 3.8  3.5 - 5.2 g/dL   AST 15  0 - 37 U/L   ALT 12  0 - 35 U/L   Alkaline Phosphatase 86  39 - 117 U/L   Total Bilirubin 0.4  0.3 - 1.2 mg/dL   GFR calc non Af Amer 44 (*) >90 mL/min   GFR calc Af Amer 51 (*) >90 mL/min   Comment: (NOTE)     The eGFR has been calculated using the CKD EPI equation.     This calculation has not been validated in all clinical situations.     eGFR's persistently <90 mL/min signify possible Chronic Kidney     Disease.  TROPONIN I     Status: None   Collection Time    11/08/13 12:18 PM  Result Value Range   Troponin I <0.30  <0.30 ng/mL   Comment:            Due to the release kinetics of cTnI,     a negative result within the first hours     of the onset of symptoms does not rule out     myocardial infarction with certainty.     If myocardial infarction is still suspected,     repeat the test at appropriate intervals.  GLUCOSE, CAPILLARY     Status: None   Collection Time    11/08/13 12:25 PM      Result Value Range   Glucose-Capillary 94  70 - 99 mg/dL   Comment 1 Notify RN     Comment 2 Documented in Chart    POCT I-STAT TROPONIN I     Status: None   Collection Time    11/08/13 12:51 PM      Result Value Range   Troponin i, poc 0.00  0.00 - 0.08 ng/mL   Comment 3            Comment: Due to the release  kinetics of cTnI,     a negative result within the first hours     of the onset of symptoms does not rule out     myocardial infarction with certainty.     If myocardial infarction is still suspected,     repeat the test at appropriate intervals.  URINALYSIS, ROUTINE W REFLEX MICROSCOPIC     Status: None   Collection Time    11/08/13  2:43 PM      Result Value Range   Color, Urine YELLOW  YELLOW   APPearance CLEAR  CLEAR   Specific Gravity, Urine 1.007  1.005 - 1.030   pH 7.0  5.0 - 8.0   Glucose, UA NEGATIVE  NEGATIVE mg/dL   Hgb urine dipstick NEGATIVE  NEGATIVE   Bilirubin Urine NEGATIVE  NEGATIVE   Ketones, ur NEGATIVE  NEGATIVE mg/dL   Protein, ur NEGATIVE  NEGATIVE mg/dL   Urobilinogen, UA 0.2  0.0 - 1.0 mg/dL   Nitrite NEGATIVE  NEGATIVE   Leukocytes, UA NEGATIVE  NEGATIVE   Comment: MICROSCOPIC NOT DONE ON URINES WITH NEGATIVE PROTEIN, BLOOD, LEUKOCYTES, NITRITE, OR GLUCOSE <1000 mg/dL.    Radiological Exams on Admission: Dg Chest 2 View  11/08/2013   CLINICAL DATA:  Facial droop  EXAM: CHEST  2 VIEW  COMPARISON:  April 01, 2013  FINDINGS: The lungs are clear. The heart size and pulmonary vascularity are normal.  Aorta is prominent with atherosclerotic change, stable. There is a stimulator with the tip at the level of T3, stable. No adenopathy. There is a compression fracture at T12. There is also the anterior wedging of the T7 vertebral body.  IMPRESSION: No edema or consolidation. Thoracic aortic prominence is stable; this finding may indicate a degree of chronic hypertensive type change. There are compression fractures in the mid and lower thoracic spine.   Electronically Signed   By: Bretta Bang M.D.   On: 11/08/2013 13:59   Ct Head (brain) Wo Contrast  11/08/2013   CLINICAL DATA:  Left-sided facial droop  EXAM: CT HEAD WITHOUT CONTRAST  TECHNIQUE: Contiguous axial images were obtained from the base of the skull through the vertex without intravenous contrast. Study  was obtained within 24 hr of patient's arrival at the emergency department.  COMPARISON:  April 01, 2013.  FINDINGS: There is moderate diffuse atrophy,  stable. There is no mass, hemorrhage, extra-axial fluid collection, or midline shift.  There is patchy small vessel disease throughout the centra semiovale bilaterally which appear stable. Compared to the previous study, there is now decreased attenuation and the head of the caudate nucleus on the right and anterior right globus pallidum. Recent infarct in this area must be of concern. No other findings suggesting potential acute infarct.  Bony calvarium appears intact.  The mastoid air cells are clear.  IMPRESSION: Question recent infarct involving the head of the caudate nucleus on the right and a portion of the anterior right globus pallidum. The other areas of decreased attenuation in the supratentorial white matter appear stable compared to the prior study. There is no hemorrhage or mass effect.   Electronically Signed   By: Bretta Bang M.D.   On: 11/08/2013 14:11    Assessment/Plan Active Problems:   Acute CVA (cerebrovascular accident)   CVA (cerebral infarction)  79 year old female now being admitted with question recent infarct on the CT scan of head. MRI cannot be obtained as patient has a stimulator in her back for chronic back pain. Will admit under telemetry and repeat CT head in a.m. Will also check carotid Dopplers and 2-D echocardiogram. We'll continue the home medications. Recheck lipid profile in a.m., hemoglobin A1c. We'll also obtain PT OT consult.  Code status: Patient is DO NOT RESUSCITATE  Family discussion: Discussed with patient's daughter and caregiver at bedside   Time Spent on Admission: 75 min  [facility] Pager: [phone] 11/08/2013, 4:44 PM  If 7PM-7AM, please contact night-coverage  [email or web address]  Password [email or web address]

## 2013-11-08 NOTE — ED Provider Notes (Signed)
CSN: [id number]     Arrival date & time 11/08/13  1132 History   First MD Initiated Contact with Patient 11/08/13 1219     Chief Complaint  Patient presents with  . Facial Droop   (Consider location/radiation/quality/duration/timing/severity/associated sxs/prior Treatment) HPI Comments: Patient is an 79 year old female with history of TIA, atrial fibrillation, hypertension. She presents today with complaints of weakness in her left arm and leg and left-sided facial droop which began yesterday evening at approximately 6 PM. This was noticed by caregivers. Patient does report some headache but denies any fall or trauma. She denies any fevers, chills, abdominal pain, or other symptoms  Patient is a 79 y.o. female presenting with weakness. The history is provided by the patient.  Weakness This is a new problem. Episode onset: Yesterday at 6 PM. The problem occurs constantly. The problem has been gradually worsening. Associated symptoms include headaches. Pertinent negatives include no chest pain, no abdominal pain and no shortness of breath. Nothing aggravates the symptoms. Nothing relieves the symptoms. She has tried nothing for the symptoms. The treatment provided no relief.    Past Medical History  Diagnosis Date  . GERD (gastroesophageal reflux disease)   . Depression   . Neuropathy   . Constipation, chronic   . Insomnia   . Chronic pain   . Hypothyroid   . Anxiety   . Stroke   . Frequent falls   . PAF (paroxysmal atrial fibrillation)     not on anticoagulation due to a fall risk, wore an Event monitor 02//23/11-03/03/11  . PAT (paroxysmal atrial tachycardia)     Supraventricular tachycardia; most recent episode associated with sepsis  . Hypertension   . AAA (abdominal aortic aneurysm) without rupture     Followed by routine Dopplers   Past Surgical History  Procedure Laterality Date  . Nm myocar perf wall motion  June 2014    No ischemia or infarction, mild apical breast  attenuation, EF roughly 70%  . Doppler echocardiography  03/2013    Normal EF 66 5%. Mild/moderate aortic regurgitation, mildly dilated RV with moderate pulmonary hypertension  . Abdomnal aorta  02/25/2012    mid abd. aorta  mid segment diltaton 3.93 x 4.55 cm greatest diameter.mild amt atherosclerosis without evidence of sigificant diamter reduction  . Lower arterial extremities doppler  02/19/2011    right abi 0.98,left 0.97  . Renal doppler  02/12/2011    abd aorta prox 2.6 x 3.1 cm,mid 4.1 x 3.6 cm, distal 2.3 x 3.4 cm, right renal artery 1-59% left renal artery norm. ,both renal size normal  . Renal doppler  02/11/2010    infrarenal AAA mearsuring 3.6 x 3.7 cm  . Appendectomy    . Cholecystectomy    . Eye surgery    . Spine surgery    . Cesarean section     Family History  Problem Relation Age of Onset  . Stroke Mother   . Heart attack Father   . Lung cancer Sister   . Bone cancer Sister    History  Substance Use Topics  . Smoking status: Former Smoker    Types: Cigarettes    Quit date: 08/14/1973  . Smokeless tobacco: Never Used  . Alcohol Use: No   OB History   Grav Para Term Preterm Abortions TAB SAB Ect Mult Living                 Review of Systems  Respiratory: Negative for shortness of breath.   Cardiovascular: Negative  for chest pain.  Gastrointestinal: Negative for abdominal pain.  Neurological: Positive for weakness and headaches.  All other systems reviewed and are negative.    Allergies  Clarithromycin; Codeine; Penicillins; and Tequin  Home Medications   Current Outpatient Rx  Name  Route  Sig  Dispense  Refill  . aspirin 81 MG tablet   Oral   Take 81 mg by mouth at bedtime.          . Biotin 5000 MCG CAPS   Oral   Take 5,000 mcg by mouth daily.         . bisacodyl (DULCOLAX) 10 MG suppository   Rectal   Place 10 mg rectally daily as needed for mild constipation or moderate constipation.         . bisacodyl (DULCOLAX) 5 MG EC  tablet   Oral   Take 5 mg by mouth daily as needed for moderate constipation.         . docusate sodium (COLACE) 100 MG capsule   Oral   Take 100 mg by mouth daily with supper.         . fish oil-omega-3 fatty acids 1000 MG capsule   Oral   Take 2 g by mouth every morning.          . furosemide (LASIX) 20 MG tablet   Oral   Take 20 mg by mouth every morning.         . gabapentin (NEURONTIN) 400 MG capsule   Oral   Take 400 mg by mouth 3 (three) times daily.         . Ibuprofen (ADVIL PO)   Oral   Take 2 tablets by mouth 2 (two) times daily as needed (for pain).         Marland Kitchen levothyroxine (SYNTHROID, LEVOTHROID) 75 MCG tablet   Oral   Take 1 tablet (75 mcg total) by mouth daily before breakfast.   30 tablet   0   . MELATONIN PO   Oral   Take 1 tablet by mouth at bedtime as needed (for sleep).         . metoprolol succinate (TOPROL-XL) 50 MG 24 hr tablet   Oral   Take 25 mg by mouth 2 (two) times daily. Take with or immediately following a meal.         . mirabegron ER (MYRBETRIQ) 50 MG TB24   Oral   Take 50 mg by mouth every evening.          . nitrofurantoin (MACRODANTIN) 100 MG capsule   Oral   Take 1 capsule by mouth every evening.         Marland Kitchen oxyCODONE (OXYCONTIN) 10 MG 12 hr tablet   Oral   Take 10 mg by mouth every 12 (twelve) hours.         . potassium chloride (K-DUR,KLOR-CON) 10 MEQ tablet   Oral   Take 10 mEq by mouth every other day.         . traZODone (DESYREL) 50 MG tablet   Oral   Take 100 mg by mouth at bedtime.         Marland Kitchen venlafaxine XR (EFFEXOR-XR) 75 MG 24 hr capsule   Oral   Take 75 mg by mouth 2 (two) times daily.         . Vitamin D, Ergocalciferol, (DRISDOL) 50000 UNITS CAPS   Oral   Take 50,000 Units by mouth every 7 (seven) days. wednesdays         .  zaleplon (SONATA) 10 MG capsule   Oral   Take 10 mg by mouth at bedtime.          BP 147/90  Pulse 70  Temp(Src) 98.9 F (37.2 C) (Oral)  Resp 16   SpO2 96% Physical Exam  Nursing note and vitals reviewed. Constitutional: She is oriented to person, place, and time. She appears well-developed and well-nourished. No distress.  Patient is an 79 year old female in no acute distress. She is awake alert and appropriate.  HENT:  Head: Normocephalic and atraumatic.  Mucous membranes appear somewhat dry.  Eyes: EOM are normal. Pupils are equal, round, and reactive to light.  Neck: Normal range of motion. Neck supple.  Cardiovascular: Normal rate and regular rhythm.  Exam reveals no gallop and no friction rub.   No murmur heard. Pulmonary/Chest: Effort normal and breath sounds normal. No respiratory distress. She has no wheezes.  Abdominal: Soft. Bowel sounds are normal. She exhibits no distension. There is no tenderness.  Musculoskeletal: Normal range of motion.  Neurological: She is alert and oriented to person, place, and time. No cranial nerve deficit. She exhibits abnormal muscle tone. Coordination normal.  There is a slight left-sided facial droop noted. Strength is 4/5 in the left upper and left lower extremities.  Skin: Skin is warm and dry. She is not diaphoretic.    ED Course  Procedures (including critical care time) Labs Review Labs Reviewed  GLUCOSE, CAPILLARY  PROTIME-INR  APTT  CBC  DIFFERENTIAL  COMPREHENSIVE METABOLIC PANEL  TROPONIN I  URINALYSIS, ROUTINE W REFLEX MICROSCOPIC   Imaging Review No results found.  EKG Interpretation    Date/Time:  Wednesday November 08 2013 11:49:23 EST Ventricular Rate:  67 PR Interval:  200 QRS Duration: 110 QT Interval:  447 QTC Calculation: 472 R Axis:   6 Text Interpretation:  Sinus rhythm Ventricular premature complex Minimal ST depression, inferior leads Baseline wander in lead(s) II III aVF Confirmed by DELOS  MD, Larnell Granlund ([id number]) on 11/08/2013 4:49:08 PM            MDM  No diagnosis found. The patient is an 79 year old female who presents with complaints of  weakness in the left arm and leg along with left-sided facial droop. Workup reveals unremarkable labs, however unfortunately CT scan reveals a new right-sided CVA.  I've consult with Dr. Leroy Kennedy from neurology who was evaluated the patient. He is recommending admission to medicine. Dr. Sharl Ma agrees to admit.    Geoffery Lyons, MD 11/08/13 [phone]

## 2013-11-08 NOTE — ED Notes (Signed)
[facility] presents with a 78 yo female from home with left sided facial drooping more pronounced than before.  Pt has a hx of TIA and left sided facial droop but when found this morning pt had a more pronounced left sided facial droop with left sided weakness.  LSN 6 pm last night. No arm drift. No slurring of speech and pt in NSR on monitor with [facility].

## 2013-11-08 NOTE — Consult Note (Addendum)
NEURO HOSPITALIST CONSULT NOTE    Reason for Consult: left sided weakness, left face droop.   HPI:                                                                                                                                          Chelsea Lynch is an 79 y.o. 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 She and her family stated that those symptoms became evident last night, at least after 6 pm. Chelsea Lynch tells me that she has slurred speech and left face-arm weakness from prior stroke, but last night noted that she was having trouble using her left side. Denies HA, vertigo, double vision, difficulty swallowing, confusion. Takes aspirin 81 mg daily. CT brain showed a probable right BG lacunar infarct.  Past Medical History  Diagnosis Date  . GERD (gastroesophageal reflux disease)   . Depression   . Neuropathy   . Constipation, chronic   . Insomnia   . Chronic pain   . Hypothyroid   . Anxiety   . Stroke   . Frequent falls   . PAF (paroxysmal atrial fibrillation)     not on anticoagulation due to a fall risk, wore an Event monitor 02//23/11-03/03/11  . PAT (paroxysmal atrial tachycardia)     Supraventricular tachycardia; most recent episode associated with sepsis  . Hypertension   . AAA (abdominal aortic aneurysm) without rupture     Followed by routine Dopplers    Past Surgical History  Procedure Laterality Date  . Nm myocar perf wall motion  June 2014    No ischemia or infarction, mild apical breast attenuation, EF roughly 70%  . Doppler echocardiography  03/2013    Normal EF 66 5%. Mild/moderate aortic regurgitation, mildly dilated RV with moderate pulmonary hypertension  . Abdomnal aorta  02/25/2012    mid abd. aorta   mid segment diltaton 3.93 x 4.55 cm greatest diameter.mild amt atherosclerosis without evidence of sigificant diamter reduction  . Lower arterial extremities doppler  02/19/2011    right abi 0.98,left 0.97  . Renal doppler  02/12/2011    abd aorta prox 2.6 x 3.1 cm,mid 4.1 x 3.6 cm, distal 2.3 x 3.4 cm, right renal artery 1-59% left renal artery norm. ,both renal size normal  . Renal doppler  02/11/2010    infrarenal AAA mearsuring 3.6 x 3.7 cm  . Appendectomy    . Cholecystectomy    . Eye surgery    . Spine surgery    . Cesarean section      Family History  Problem Relation  Age of Onset  . Stroke Mother   . Heart attack Father   . Lung cancer Sister   . Bone cancer Sister     Social History:  reports that she quit smoking about 40 years ago. Her smoking use included Cigarettes. She smoked 0.00 packs per day. She has never used smokeless tobacco. She reports that she does not drink alcohol or use illicit drugs.  Allergies  Allergen Reactions  . Clarithromycin Other (See Comments)    Unknown   . Codeine Other (See Comments)    Unknown   . Penicillins Other (See Comments)    Unknown   . Tequin [Gatifloxacin] Other (See Comments)    Unknown     MEDICATIONS:                                                                                                                     I have reviewed the patient's current medications.   ROS:                                                                                                                                       History obtained from the patient, family, and chart review.  General ROS: negative for - chills, fatigue, fever, night sweats, weight gain or weight loss Psychological ROS: negative for - behavioral disorder, hallucinations, memory difficulties, mood swings or suicidal ideation Ophthalmic ROS: negative for - blurry vision, double vision, eye pain or loss of vision ENT ROS: negative for - epistaxis, nasal discharge,  oral lesions, sore throat, tinnitus or vertigo Allergy and Immunology ROS: negative for - hives or itchy/watery eyes Hematological and Lymphatic ROS: negative for - bleeding problems, bruising or swollen lymph nodes Endocrine ROS: negative for - galactorrhea, hair pattern changes, polydipsia/polyuria or temperature intolerance Respiratory ROS: negative for - cough, hemoptysis, shortness of breath or wheezing Cardiovascular ROS: negative for - chest pain, dyspnea on exertion, edema or irregular heartbeat Gastrointestinal ROS: negative for - abdominal pain, diarrhea, hematemesis, nausea/vomiting or stool incontinence Genito-Urinary ROS: negative for - dysuria, hematuria, incontinence or urinary frequency/urgency Musculoskeletal ROS: negative for - joint swelling Neurological ROS: as noted in HPI Dermatological ROS: negative for rash and skin lesion changes   Physical exam: pleasant female in no apparent distress. Blood pressure 177/84, pulse 67, temperature 98 F (36.7 C), temperature source Oral, resp. rate 17, SpO2 96.00%. Head: normocephalic. Neck: supple, no bruits, no  JVD. Cardiac: no murmurs. Lungs: clear. Abdomen: soft, no tender, no mass. Extremities: no edema.  Neurologic Examination:                                                                                                      Mental Status: Alert, oriented, thought content appropriate.  Mild dysarthria without evidence of aphasia.  Able to follow 3 step commands without difficulty. Cranial Nerves: II: Discs flat bilaterally; Visual fields grossly normal, pupils equal, round, reactive to light and accommodation III,IV, VI: ptosis not present, extra-ocular motions intact bilaterally V,VII: smile asymmetric with mild left face weakness, facial light touch sensation normal bilaterally VIII: hearing normal bilaterally IX,X: gag reflex present XI: bilateral shoulder shrug XII: midline tongue extension without atrophy or  fasciculations  Motor: Left hemiparesis, arm greater than leg. Tone increased left arm. Sensory: Pinprick and light touch intact throughout, bilaterally Deep Tendon Reflexes:  Right: Upper Extremity   Left: Upper extremity   biceps (C-5 to C-6) 2/4   biceps (C-5 to C-6) 2/4 tricep (C7) 2/4    triceps (C7) 2/4 Brachioradialis (C6) 2/4  Brachioradialis (C6) 2/4  Lower Extremity Lower Extremity  quadriceps (L-2 to L-4) 2/4   quadriceps (L-2 to L-4) 2/4 Achilles (S1) 2/4   Achilles (S1) 2/4  Plantars: Right: downgoing   Left: downgoing Cerebellar: normal finger-to-nose,  normal heel-to-shin test Gait:  No tested. CV: pulses palpable throughout    No results found for this basename: cbc, bmp, coags, chol, tri, ldl, hga1c    Results for orders placed during the hospital encounter of 11/08/13 (from the past 48 hour(s))  PROTIME-INR     Status: None   Collection Time    11/08/13 12:18 PM      Result Value Range   Prothrombin Time 12.0  11.6 - 15.2 seconds   INR 0.90  0.00 - 1.49  APTT     Status: None   Collection Time    11/08/13 12:18 PM      Result Value Range   aPTT 27  24 - 37 seconds  CBC     Status: None   Collection Time    11/08/13 12:18 PM      Result Value Range   WBC 6.8  4.0 - 10.5 K/uL   RBC 4.25  3.87 - 5.11 MIL/uL   Hemoglobin 13.0  12.0 - 15.0 g/dL   HCT 09.6  04.5 - 40.9 %   MCV 88.7  78.0 - 100.0 fL   MCH 30.6  26.0 - 34.0 pg   MCHC 34.5  30.0 - 36.0 g/dL   RDW 81.1  91.4 - 78.2 %   Platelets 214  150 - 400 K/uL  DIFFERENTIAL     Status: None   Collection Time    11/08/13 12:18 PM      Result Value Range   Neutrophils Relative % 69  43 - 77 %   Neutro Abs 4.7  1.7 - 7.7 K/uL   Lymphocytes Relative 17  12 - 46 %   Lymphs Abs 1.1  0.7 -  4.0 K/uL   Monocytes Relative 10  3 - 12 %   Monocytes Absolute 0.7  0.1 - 1.0 K/uL   Eosinophils Relative 5  0 - 5 %   Eosinophils Absolute 0.3  0.0 - 0.7 K/uL   Basophils Relative 1  0 - 1 %   Basophils  Absolute 0.0  0.0 - 0.1 K/uL  COMPREHENSIVE METABOLIC PANEL     Status: Abnormal   Collection Time    11/08/13 12:18 PM      Result Value Range   Sodium 139  135 - 145 mEq/L   Potassium 3.7  3.5 - 5.1 mEq/L   Chloride 101  96 - 112 mEq/L   CO2 29  19 - 32 mEq/L   Glucose, Bld 93  70 - 99 mg/dL   BUN 18  6 - 23 mg/dL   Creatinine, Ser 6.21  0.50 - 1.10 mg/dL   Calcium 9.3  8.4 - 30.8 mg/dL   Total Protein 7.1  6.0 - 8.3 g/dL   Albumin 3.8  3.5 - 5.2 g/dL   AST 15  0 - 37 U/L   ALT 12  0 - 35 U/L   Alkaline Phosphatase 86  39 - 117 U/L   Total Bilirubin 0.4  0.3 - 1.2 mg/dL   GFR calc non Af Amer 44 (*) >90 mL/min   GFR calc Af Amer 51 (*) >90 mL/min   Comment: (NOTE)     The eGFR has been calculated using the CKD EPI equation.     This calculation has not been validated in all clinical situations.     eGFR's persistently <90 mL/min signify possible Chronic Kidney     Disease.  TROPONIN I     Status: None   Collection Time    11/08/13 12:18 PM      Result Value Range   Troponin I <0.30  <0.30 ng/mL   Comment:            Due to the release kinetics of cTnI,     a negative result within the first hours     of the onset of symptoms does not rule out     myocardial infarction with certainty.     If myocardial infarction is still suspected,     repeat the test at appropriate intervals.  GLUCOSE, CAPILLARY     Status: None   Collection Time    11/08/13 12:25 PM      Result Value Range   Glucose-Capillary 94  70 - 99 mg/dL   Comment 1 Notify RN     Comment 2 Documented in Chart    POCT I-STAT TROPONIN I     Status: None   Collection Time    11/08/13 12:51 PM      Result Value Range   Troponin i, poc 0.00  0.00 - 0.08 ng/mL   Comment 3            Comment: Due to the release kinetics of cTnI,     a negative result within the first hours     of the onset of symptoms does not rule out     myocardial infarction with certainty.     If myocardial infarction is still suspected,      repeat the test at appropriate intervals.  URINALYSIS, ROUTINE W REFLEX MICROSCOPIC     Status: None   Collection Time    11/08/13  2:43 PM      Result Value Range  Color, Urine YELLOW  YELLOW   APPearance CLEAR  CLEAR   Specific Gravity, Urine 1.007  1.005 - 1.030   pH 7.0  5.0 - 8.0   Glucose, UA NEGATIVE  NEGATIVE mg/dL   Hgb urine dipstick NEGATIVE  NEGATIVE   Bilirubin Urine NEGATIVE  NEGATIVE   Ketones, ur NEGATIVE  NEGATIVE mg/dL   Protein, ur NEGATIVE  NEGATIVE mg/dL   Urobilinogen, UA 0.2  0.0 - 1.0 mg/dL   Nitrite NEGATIVE  NEGATIVE   Leukocytes, UA NEGATIVE  NEGATIVE   Comment: MICROSCOPIC NOT DONE ON URINES WITH NEGATIVE PROTEIN, BLOOD, LEUKOCYTES, NITRITE, OR GLUCOSE <1000 mg/dL.    Dg Chest 2 View  11/08/2013   CLINICAL DATA:  Facial droop  EXAM: CHEST  2 VIEW  COMPARISON:  April 01, 2013  FINDINGS: The lungs are clear. The heart size and pulmonary vascularity are normal.  Aorta is prominent with atherosclerotic change, stable. There is a stimulator with the tip at the level of T3, stable. No adenopathy. There is a compression fracture at T12. There is also the anterior wedging of the T7 vertebral body.  IMPRESSION: No edema or consolidation. Thoracic aortic prominence is stable; this finding may indicate a degree of chronic hypertensive type change. There are compression fractures in the mid and lower thoracic spine.   Electronically Signed   By: Bretta Bang M.D.   On: 11/08/2013 13:59   Ct Head (brain) Wo Contrast  11/08/2013   CLINICAL DATA:  Left-sided facial droop  EXAM: CT HEAD WITHOUT CONTRAST  TECHNIQUE: Contiguous axial images were obtained from the base of the skull through the vertex without intravenous contrast. Study was obtained within 24 hr of patient's arrival at the emergency department.  COMPARISON:  April 01, 2013.  FINDINGS: There is moderate diffuse atrophy, stable. There is no mass, hemorrhage, extra-axial fluid collection, or midline shift.   There is patchy small vessel disease throughout the centra semiovale bilaterally which appear stable. Compared to the previous study, there is now decreased attenuation and the head of the caudate nucleus on the right and anterior right globus pallidum. Recent infarct in this area must be of concern. No other findings suggesting potential acute infarct.  Bony calvarium appears intact.  The mastoid air cells are clear.  IMPRESSION: Question recent infarct involving the head of the caudate nucleus on the right and a portion of the anterior right globus pallidum. The other areas of decreased attenuation in the supratentorial white matter appear stable compared to the prior study. There is no hemorrhage or mass effect.   Electronically Signed   By: Bretta Bang M.D.   On: 11/08/2013 14:11   Assessment/Plan: 79 y/o with several risk factors for stroke and probable acute right subcortical infarct. Can not have MRI due to spinal cord stimulator but will get follow up CT brain in the morning. Recommend: 1) Stroke work up. 2) Aspirin 325 mg daily. 3) Stroke team to resume care in the morning.  Wyatt Portela, MD 11/08/2013, 4:06 PM

## 2013-11-09 ENCOUNTER — Inpatient Hospital Stay (HOSPITAL_COMMUNITY): Payer: Medicare Other

## 2013-11-09 DIAGNOSIS — I4891 Unspecified atrial fibrillation: Secondary | ICD-10-CM

## 2013-11-09 DIAGNOSIS — E039 Hypothyroidism, unspecified: Secondary | ICD-10-CM

## 2013-11-09 DIAGNOSIS — I359 Nonrheumatic aortic valve disorder, unspecified: Secondary | ICD-10-CM

## 2013-11-09 LAB — LIPID PANEL
Cholesterol: 188 mg/dL (ref 0–200)
HDL: 64 mg/dL (ref >39)
LDL Cholesterol: 111 mg/dL — ABNORMAL HIGH (ref 0–99)
Total CHOL/HDL Ratio: 2.9 RATIO

## 2013-11-09 LAB — HEMOGLOBIN A1C
Hgb A1c MFr Bld: 5.8 % — ABNORMAL HIGH (ref <5.7)
Mean Plasma Glucose: 120 mg/dL — ABNORMAL HIGH (ref <117)

## 2013-11-09 MED ORDER — STROKE: EARLY STAGES OF RECOVERY BOOK
Freq: Once | Status: AC
  Administered 2013-11-09: 1
  Filled 2013-11-09: qty 1

## 2013-11-09 MED ORDER — ACETAMINOPHEN 325 MG PO TABS
650.0000 mg | ORAL_TABLET | Freq: Four times a day (QID) | ORAL | Status: DC | PRN
  Administered 2013-11-09 – 2013-11-10 (×3): 650 mg via ORAL
  Filled 2013-11-09 (×3): qty 2

## 2013-11-09 MED ORDER — CLOPIDOGREL BISULFATE 75 MG PO TABS
75.0000 mg | ORAL_TABLET | Freq: Every day | ORAL | Status: DC
  Administered 2013-11-10: 75 mg via ORAL
  Filled 2013-11-09: qty 1

## 2013-11-09 MED ORDER — ATORVASTATIN CALCIUM 10 MG PO TABS
10.0000 mg | ORAL_TABLET | Freq: Every day | ORAL | Status: DC
  Administered 2013-11-09: 10 mg via ORAL
  Filled 2013-11-09 (×2): qty 1

## 2013-11-09 NOTE — Progress Notes (Signed)
VASCULAR LAB PRELIMINARY  PRELIMINARY  PRELIMINARY  PRELIMINARY  Carotid Dopplers completed.    Preliminary report:  There is 1-39% ICA stenosis.  Vertebral artery flow is antegrade.  Rj Pedrosa, RVT 11/09/2013, 9:22 AM

## 2013-11-09 NOTE — Evaluation (Signed)
Physical Therapy Evaluation Patient Details Name: Chelsea Lynch MRN: [id number] DOB: 02-Aug-1925 Today's Date: 11/09/2013 Time: 4098-1191 PT Time Calculation (min): 30 min  PT Assessment / Plan / Recommendation History of Present Illness  Ms. Chelsea Lynch is a 79 y.o. female presenting with left hemiparesis (worse than usual). Imaging confirms hypodensity right anterior basal ganglia concerning for acute infarct. Infarct felt to be thrombotic secondary to small vessel disease.  On aspirin 81 mg orally every day prior to admission. Now on plavix 75mg  daily for secondary stroke prevention. Patient with resultant left hemiparesis  Clinical Impression  Patient is pleasant and willing to participate in therapy today.  Per her daughter's report, she keeps doing more as the day goes on.  She presents with L sided neglect and hemiparesis.  Her functional deficits are listed below and are significant enough to need to be addressed at home with Home Health PT.   She presents will a history of falls that will be exacerbated with her current status. The PT has a supportive husband and family in close proximity to her house.  She will benefit from skilled PT intervention to increase functional strength, balance, and decrease fall risk.    PT Assessment  Patient needs continued PT services    Follow Up Recommendations  Home health PT          Equipment Recommendations  None recommended by PT       Frequency Min 4X/week    Precautions / Restrictions Precautions Precautions: Fall Restrictions Weight Bearing Restrictions: No   Pertinent Vitals/Pain None reported      Mobility  Bed Mobility Bed Mobility: Supine to Sit Supine to Sit: With rails;3: Mod assist Details for Bed Mobility Assistance: Assist trunk and LE to get out of bed.  Is able to start the motion by pulling on the rails, but needs A to complete the task. Transfers Transfers: Sit to Stand;Stand to Sit Sit to Stand: From bed;4:  Min assist;3: Mod assist;With upper extremity assist Stand to Sit: To bed;With upper extremity assist;4: Min assist Details for Transfer Assistance: Needs trunk and UE assistance to get the motion started Ambulation/Gait Ambulation/Gait Assistance: 3: Mod assist Ambulation Distance (Feet): 40 Feet (3 laps with a rest break on her bed.  ) Assistive device: 1 person hand held assist Ambulation/Gait Assistance Details: Requires assistance at the trunk for occasional stumbling and a hand along furniture for support.  Despite L sided weakness, she didn't need assistance to advance the limb.  Due to her L neglect, she kept leaning towards the L side and bumping into the wall. Gait Pattern: Step-to pattern;Decreased stride length Gait velocity: decreased Stairs: No    Exercises     PT Diagnosis: Difficulty walking;Generalized weakness;Hemiplegia non-dominant side  PT Problem List: Decreased strength;Decreased activity tolerance;Decreased balance PT Treatment Interventions: DME instruction;Gait training;Stair training;Therapeutic exercise;Therapeutic activities;Balance training;Neuromuscular re-education     PT Goals(Current goals can be found in the care plan section) Acute Rehab PT Goals Patient Stated Goal: to go home PT Goal Formulation: With patient Time For Goal Achievement: 11/23/13 Potential to Achieve Goals: Good  Visit Information  Last PT Received On: 11/09/13 Assistance Needed: +1 History of Present Illness: Ms. Chelsea Lynch is a 79 y.o. female presenting with left hemiparesis (worse than usual). Imaging confirms hypodensity right anterior basal ganglia concerning for acute infarct. Infarct felt to be thrombotic secondary to small vessel disease.  On aspirin 81 mg orally every day prior to admission. Now on plavix  75mg  daily for secondary stroke prevention. Patient with resultant left hemiparesis       Prior Functioning  Home Living Family/patient expects to be discharged  to:: Private residence Living Arrangements: Spouse/significant other Available Help at Discharge: Family;Personal care attendant;Available 24 hours/day Type of Home: House Home Access: Stairs to enter [employer] of Steps: 5 Entrance Stairs-Rails: Right;Left Home Layout: One level Home Equipment: Cane - quad;Walker - 4 wheels;Bedside commode;Shower seat Additional Comments: caregivers  24/7 for ADLs prn , cooking, supervision, laundry, med [employer], grocery shopping  Lives With: Spouse Prior Function Level of Independence: Independent with assistive device(s) Comments: Need A with stairs in front of house, occasionally with showering and toileting Communication Communication: No difficulties Dominant Hand: Right    Cognition  Cognition Arousal/Alertness: Awake/alert Behavior During Therapy: WFL for tasks assessed/performed Overall Cognitive Status: History of cognitive impairments - at baseline Area of Impairment: Awareness Orientation Level: Place Memory: Decreased short-term memory Awareness: Intellectual General Comments: Has left sided neglect, but will attend to L side if prompted    Extremity/Trunk Assessment Upper Extremity Assessment Upper Extremity Assessment: LUE deficits/detail LUE Deficits / Details: Daughter reports that there was no residual impairments from previous injury, reports taht the deficits are new and very limiting. Weakness throughout L UE in all tasks. LUE Coordination: decreased gross motor;decreased fine motor Lower Extremity Assessment Lower Extremity Assessment: LLE deficits/detail LLE Deficits / Details: Hip flexion, Knee flexion, DF are all 3+/5 and weaker than the R side   Balance Balance Balance Assessed: Yes Static Sitting Balance Static Sitting - Balance Support: No upper extremity supported;Feet supported Static Sitting - Level of Assistance: 5: Stand by assistance Dynamic Sitting Balance Dynamic Sitting - Balance Support:  Bilateral upper extremity supported Dynamic Sitting - Level of Assistance: 3: Mod assist Dynamic Sitting - Balance Activities: Reaching for objects Dynamic Sitting - Comments: Requires trunk assistance when reaching and sitting for more than 1 min. Fatigues and falls backwards  End of Session PT - End of Session Equipment Utilized During Treatment: Gait belt Activity Tolerance: Patient tolerated treatment well Patient left: in bed;with bed alarm set;with call bell/phone within reach;with family/visitor present  GP    Barrie Dunker, SPT Pager:  [phone]  Barrie Dunker 11/09/2013, 5:32 PM

## 2013-11-09 NOTE — Progress Notes (Signed)
UR completed 

## 2013-11-09 NOTE — Progress Notes (Signed)
  Echocardiogram 2D Echocardiogram has been performed.  Fleites, Aritza Brunet 11/09/2013, 9:49 AM

## 2013-11-09 NOTE — Care Management Note (Addendum)
    Page 1 of 2   11/10/2013     11:47:03 AM   CARE MANAGEMENT NOTE 11/10/2013  Patient:  Chelsea Lynch, Chelsea Lynch   Account Number:  [id number]  Date Initiated:  11/09/2013  Documentation initiated by:  HUTCHINSON,CRYSTAL  Subjective/Objective Assessment:   Admitted with Left side weakness (left arm and leg along with some left facial weakness.)     Action/Plan:   CM will continue to monitor for disposition needs.  PT eval:  pending   Anticipated DC Date:  11/10/2013   Anticipated DC Plan:  HOME W HOME HEALTH SERVICES      DC Planning Services  CM consult      [facility] Choice  HOME HEALTH   Choice offered to / List presented to:  C-4 Adult Children        HH arranged  HH-2 PT  HH-3 OT      [facility] agency  [facility]   Status of service:  Completed, signed off Medicare Important Message given?   (If response is "NO", the following Medicare IM given date fields will be blank) Date Medicare IM given:   Date Additional Medicare IM given:    Discharge Disposition:  HOME W HOME HEALTH SERVICES  Per UR Regulation:  Reviewed for med. necessity/level of care/duration of stay  If discussed at Long Length of Stay Meetings, dates discussed:    Comments:  HHS:  Addendum:  OT Services ordered in addition to PT. Donato Schultz, RN, BSN, MSHL, CCM 11/10/2013   HHS:  PT ordered. DME in the home:  RW, 3n1 with riser, shower chair.   No other  DME needs at d/c. Elizebeth Koller notified.  SOC to begin within 24-48 hours of d/c. Donato Schultz, RN, BSN, MSHL, CCM 11/10/2013   Member lives in her home with husband.  PCG/DTR/Suzanne lives close by and assist with [facility] mgmt.  H/x past HHS with [place] and active with [employer]/private pay.  DME in the home:  RW, 3n1 with riser, shower chair.  PT recs: pending ______.  Elects services with Genevieve Norlander if [facility] services needed. Crystal Hutchinson RN, BSN, [place], CCM 11/09/2013

## 2013-11-09 NOTE — Progress Notes (Signed)
Stroke Team Progress Note  HISTORY Chelsea Lynch is an 79 y.o. 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   She and her family stated that those symptoms became evident last night, at least after 6 pm 11/07/2013.  Chelsea Lynch tells me that she has slurred speech and left face-arm weakness from prior stroke, but last night noted that she was having trouble using her left side. Denies HA, vertigo, double vision, difficulty swallowing, confusion.   Takes aspirin 81 mg daily.   CT brain showed a probable right BG lacunar infarct  Patient was not a TPA candidate secondary to symptoms out of window. She was admitted for further evaluation and treatment.   SUBJECTIVE She is in CT scan room. No family around.  Overall she feels her condition is rapidly improving. Feels that speech is at baseline.   OBJECTIVE Most recent Vital Signs: Filed Vitals:   11/08/13 2049 11/09/13 0023 11/09/13 0423 11/09/13 0808  BP: 139/79 145/83 137/88 155/86  Pulse: 70 73 71 86  Temp: 97.6 F (36.4 C) 97.8 F (36.6 C) 97.9 F (36.6 C) 97.9 F (36.6 C)  TempSrc: Oral Oral Oral   Resp: 16 18 18 18   Height:      SpO2: 94% 92% 93% 99%   CBG (last 3)   Recent Labs  11/08/13 1225  GLUCAP 94    IV Fluid Intake:     MEDICATIONS  . aspirin EC  325 mg Oral Daily  . docusate sodium  100 mg Oral Q supper  . enoxaparin (LOVENOX) injection  40 mg Subcutaneous Q24H  . furosemide  20 mg Oral q morning - 10a  . gabapentin  400 mg Oral TID  . levothyroxine  75 mcg Oral QAC breakfast  . mirabegron ER  50 mg Oral QPM  . nitrofurantoin  100 mg Oral QPM  . OxyCODONE  10 mg Oral Q12H  . potassium chloride  10 mEq Oral QODAY  . sodium chloride  3 mL Intravenous Q12H  . traZODone  100 mg  Oral QHS  . venlafaxine XR  75 mg Oral BID  . [START ON 11/15/2013] Vitamin D (Ergocalciferol)  50,000 Units Oral Q7 days   PRN:  sodium chloride, bisacodyl, bisacodyl, sodium chloride, zolpidem  Diet:  Cardiac thin liquids Activity:  Bedrest  DVT Prophylaxis:  lovenox  CLINICALLY SIGNIFICANT STUDIES Basic Metabolic Panel:  Recent Labs Lab 11/08/13 1218  NA 139  K 3.7  CL 101  CO2 29  GLUCOSE 93  BUN 18  CREATININE 1.10  CALCIUM 9.3   Liver Function Tests:  Recent Labs Lab 11/08/13 1218  AST 15  ALT 12  ALKPHOS 86  BILITOT 0.4  PROT 7.1  ALBUMIN 3.8   CBC:  Recent Labs Lab 11/08/13 1218  WBC 6.8  NEUTROABS 4.7  HGB 13.0  HCT 37.7  MCV 88.7  PLT 214   Coagulation:  Recent Labs Lab 11/08/13 1218  LABPROT 12.0  INR 0.90   Cardiac Enzymes:  Recent Labs Lab 11/08/13 1218  TROPONINI <0.30   Urinalysis:  Recent Labs Lab 11/08/13 1443  COLORURINE YELLOW  LABSPEC 1.007  PHURINE 7.0  GLUCOSEU NEGATIVE  HGBUR NEGATIVE  BILIRUBINUR NEGATIVE  KETONESUR NEGATIVE  PROTEINUR NEGATIVE  UROBILINOGEN 0.2  NITRITE NEGATIVE  LEUKOCYTESUR NEGATIVE  Lipid Panel    Component Value Date/Time   CHOL 188 11/09/2013 0358   TRIG 67 11/09/2013 0358   HDL 64 11/09/2013 0358   CHOLHDL 2.9 11/09/2013 0358   VLDL 13 11/09/2013 0358   LDLCALC 111* 11/09/2013 0358   HgbA1C  Lab Results  Component Value Date   HGBA1C 5.8* 11/08/2013    Urine Drug Screen:     Component Value Date/Time   LABOPIA NONE DETECTED 03/25/2013 2254   COCAINSCRNUR NONE DETECTED 03/25/2013 2254   LABBENZ NONE DETECTED 03/25/2013 2254   AMPHETMU NONE DETECTED 03/25/2013 2254   THCU NONE DETECTED 03/25/2013 2254   LABBARB NONE DETECTED 03/25/2013 2254    Alcohol Level: No results found for this basename: ETH,  in the last 168 hours  Dg Chest 2 View 11/08/2013   No edema or consolidation. Thoracic aortic prominence is stable; this finding may indicate a degree of chronic hypertensive type  change. There are compression fractures in the mid and lower thoracic spine.   Ct Head (brain) Wo Contrast 11/08/2013  Question recent infarct involving the head of the caudate nucleus on the right and a portion of the anterior right globus pallidum. The other areas of decreased attenuation in the supratentorial white matter appear stable compared to the prior study. There is no hemorrhage or mass effect.   11/09/2013  Hypodensity right anterior basal ganglia is unchanged and may represent recent infarction. Negative for hemorrhage.   TCD:  MRI of the brain  (spinal cord stimulator)  MRA of the brain  (spinal cord stimulator)  2D Echocardiogram  EF 50%, Possible mild hypokinesis of the distalanteroseptal myocardium, LA size upper limits of normal.  Carotid Doppler  Preliminary report: There is 1-39% ICA stenosis. Vertebral artery flow is antegrade.  CXR    EKG  normal sinus rhythm.   Therapy Recommendations   Physical Exam   Mental Status:  Alert, oriented, thought content appropriate. Mild dysarthria without evidence of aphasia. Able to follow 3 step commands without difficulty.  Cranial Nerves:  II: Discs flat bilaterally; Visual fields grossly normal, pupils equal, round, reactive to light and accommodation  III,IV, VI: ptosis not present, extra-ocular motions intact bilaterally  V,VII: smile asymmetric with mild left face weakness, facial light touch sensation normal bilaterally  VIII: hearing normal bilaterally  IX,X: gag reflex present  XI: bilateral shoulder shrug  XII: midline tongue extension without atrophy or fasciculations  Motor:  Left hemiparesis, arm greater than leg.  Tone increased left arm.  Sensory: Pinprick and light touch intact throughout, bilaterally  Deep Tendon Reflexes:  Right: Upper Extremity Left: Upper extremity  biceps (C-5 to C-6) 2/4 biceps (C-5 to C-6) 2/4  tricep (C7) 2/4 triceps (C7) 2/4  Brachioradialis (C6) 2/4 Brachioradialis (C6) 2/4   Lower Extremity Lower Extremity  quadriceps (L-2 to L-4) 2/4 quadriceps (L-2 to L-4) 2/4  Achilles (S1) 2/4 Achilles (S1) 2/4  Plantars:  Right: downgoing Left: downgoing  Cerebellar:  normal finger-to-nose, normal heel-to-shin test  Gait:  No tested.  CV: pulses palpable throughout    ASSESSMENT Chelsea Lynch is a 79 y.o. female presenting with left hemiparesis (worse than usual). Imaging confirms hypodensity right anterior basal ganglia concerning for acute infarct. Infarct felt to be thrombotic secondary to small vessel disease.  On aspirin 81 mg orally every day prior to admission. Now on plavix 75mg  daily for secondary stroke prevention. Patient with resultant left hemiparesis. Work up underway.   Hyperlipidemia, LDL 114, goal < 100, on  fish oil prior to admission, add statin. She states that she has never had trouble with taking statin medications, liver function is normal.  HGB A1C   5.8  Paroxysmal atrial fibrillation: patient felt to be fall risk and was not anticoagulated around 2011  Hypertension  Long term medication use   Hospital day # 1  TREATMENT/PLAN  Continue clopidogrel 75 mg orally every day for secondary stroke prevention.  Add statin  Increase activity  Await therapy evaluations  TCD ordered  Risk factor modification  Gwendolyn Lima. Manson Passey, [facility], MBA, MHA [facility] Pager: [phone] 11/09/2013 2:15 PM  I have personally obtained a history, examined the patient, evaluated imaging results, and formulated the assessment and plan of care. I agree with the above.  Delia Heady, MD

## 2013-11-09 NOTE — Evaluation (Signed)
Read, reviewed, and agree.  Takeyla Million B. Chene Kasinger, PT, DPT #[phone]  

## 2013-11-09 NOTE — Evaluation (Signed)
Speech Language Pathology Evaluation Patient Details Name: Chelsea Lynch MRN: [id number] DOB: [birth year] Today's Date: 11/09/2013 Time: 4098-1191 SLP Time Calculation (min): 20 min  Problem List:  Patient Active Problem List   Diagnosis Date Noted  . Acute CVA (cerebrovascular accident) 11/08/2013  . CVA (cerebral infarction) 11/08/2013  . Chest pain with moderate risk for cardiac etiology 05/15/2013  . PAF (paroxysmal atrial fibrillation)   . Dysphagia, unspecified(787.20) 04/02/2013  . Opiate withdrawal 03/29/2013  . Unspecified hypothyroidism 03/28/2013  . Normocytic anemia 03/28/2013  . GERD (gastroesophageal reflux disease) 03/28/2013  . Infiltrate noted on imaging study 03/28/2013  . Elevated troponin- felt to be secondary to SVT 03/28/2013  . SVT (supraventricular tachycardia) 03/28/2013  . Urosepsis 03/27/2013  . Bacteremia due to Gram-negative bacteria 03/27/2013  . Septic shock(785.52) 03/27/2013  . Aspiration pneumonia 03/27/2013  . Acute on chronic renal insufficiency- resolved 03/27/2013  . BACK PAIN, CHRONIC- on chronic narcotics at home 01/23/2010  . NONSPECIFIC ABN FINDNG RAD&OTH EXAM BILARY TRCT 01/23/2010  . TIA 01/22/2010  . DIVERTICULOSIS, COLON 01/22/2010  . GASTRITIS, ACUTE 01/05/2008  . CONSTIPATION, SLOW TRANSIT 01/05/2008   Past Medical History:  Past Medical History  Diagnosis Date  . GERD (gastroesophageal reflux disease)   . Depression   . Neuropathy   . Constipation, chronic   . Insomnia   . Chronic pain   . Hypothyroid   . Anxiety   . Frequent falls     "not much in the last few months" (11/08/2013)  . PAF (paroxysmal atrial fibrillation)     not on anticoagulation due to a fall risk, wore an Event monitor 02//23/11-03/03/11  . PAT (paroxysmal atrial tachycardia)     Supraventricular tachycardia; most recent episode associated with sepsis  . Hypertension   . AAA (abdominal aortic aneurysm) without rupture     Followed by routine  Dopplers  . Spinal stenosis   . Complication of anesthesia     "it's hard for me to wakeup" (11/08/2013)  . Pneumonia     "several years ago" (11/08/2013)  . Chronic bronchitis     "hasn't had it in quite a few years" (11/08/2013)  . Shortness of breath     "can come about at anytime" (11/08/2013)  . Sleep apnea     "doesn't wear a mask; RX'd but wouldn't get one" (11/08/2013)  . Headache(784.0)     "at least weekly; sometimes daily" (11/08/2013)  . Stroke ?1980's    denies residual on 11/08/2013  . DDD (degenerative disc disease)     "all of her spine" (11/08/2013)  . Arthritis     "joints; back; hands are getting pretty bad" (11/08/2013)  . Chronic lower back pain   . Chronic leg pain     "nerve pain" (11/08/2013)  . Dementia     "confusion comes and goes; mostly situational; does not have good short term memory but won't admit it" (11/08/2013)   Past Surgical History:  Past Surgical History  Procedure Laterality Date  . Nm myocar perf wall motion  June 2014    No ischemia or infarction, mild apical breast attenuation, EF roughly 70%  . Doppler echocardiography  03/2013    Normal EF 66 5%. Mild/moderate aortic regurgitation, mildly dilated RV with moderate pulmonary hypertension  . Abdomnal aorta  02/25/2012    mid abd. aorta  mid segment diltaton 3.93 x 4.55 cm greatest diameter.mild amt atherosclerosis without evidence of sigificant diamter reduction  . Lower arterial extremities doppler  02/19/2011  right abi 0.98,left 0.97  . Renal doppler  02/12/2011    abd aorta prox 2.6 x 3.1 cm,mid 4.1 x 3.6 cm, distal 2.3 x 3.4 cm, right renal artery 1-59% left renal artery norm. ,both renal size normal  . Renal doppler  02/11/2010    infrarenal AAA mearsuring 3.6 x 3.7 cm  . Appendectomy    . Cholecystectomy    . Eye surgery    . Lumbar disc surgery  X 3  . Tonsillectomy    . Knee arthroscopy Right ?1980's  . Vaginal hysterectomy    . Dilation and curettage of uterus      "a few"  (11/08/2013)  . Cataract extraction w/ intraocular lens  implant, bilateral Bilateral 2000's  . Anterior cervical decomp/discectomy fusion     HPI:  79 year old female with a history of hypertension, paroxysmal atrial fibrillation, GERD, , CVA, Anxiety, AAA, hypothyroidism came to the ED with chief complaint of left side weakness including the left arm and leg along with some left facial weakness. As per patient's daughter the symptoms started yesterday at 5 PM. There was no slurred speech no seizure like activity no headache no double vision there was no difficulty swallowing.  In the ED, CT of the head showed questionable recent infarct involving the head of the caudate nucleus on the right and a portion of the anterior right globus pallidum.   Assessment / Plan / Recommendation Clinical Impression  Pt. seen for speech-cognitive-linguistic assessment with caregiver present.  Pt. has baseline cognitive deficits and has day and nightime caregivers.  She is not responsible for medication management, shopping, cooking or orgamizing appointments.  She was sleepy throughout assessment but arousable with frequent verbal and tactile stimuli.  She is oriented to person and month but not to place or situation.  Caregiver reported she is appears at baseline.  Given baseline impairments and level of assistance, pt. does not require ST at this time.       SLP Assessment  Patient does not need any further Speech Lanaguage Pathology Services    Follow Up Recommendations  None    Frequency and Duration        Pertinent Vitals/Pain No pain       SLP Evaluation Prior Functioning  Cognitive/Linguistic Baseline: Baseline deficits Baseline deficit details:  (memory, problem solving) Type of Home: House  Lives With: Spouse Available Help at Discharge: Family;Personal care attendant;Available 24 hours/day   Cognition  Overall Cognitive Status: History of cognitive impairments - at baseline (Caregiver  reports appears at baseline) Arousal/Alertness: Lethargic Orientation Level: Oriented to person;Disoriented to situation;Oriented to time;Disoriented to place Attention: Focused Focused Attention: Appears intact Memory: Impaired Memory Impairment:  (history of memory deficits) Awareness: Impaired Awareness Impairment: Intellectual impairment;Emergent impairment;Anticipatory impairment Problem Solving: Impaired Problem Solving Impairment: Functional basic;Verbal basic Safety/Judgment: Impaired    Comprehension  Auditory Comprehension Overall Auditory Comprehension: Appears within functional limits for tasks assessed Interfering Components: Attention;Processing speed;Working Theatre manager: Not tested Reading Comprehension Reading Status: Not tested    Expression Expression Primary Mode of Expression: Verbal Verbal Expression Overall Verbal Expression: Appears within functional limits for tasks assessed Initiation:  (required prompts due to lethargy) Level of Generative/Spontaneous Verbalization: Sentence Naming: Not tested Pragmatics: No impairment Written Expression Dominant Hand: Right Written Expression: Not tested   Oral / Motor Oral Motor/Sensory Function Overall Oral Motor/Sensory Function:  (unable to fully assess due to lethargy) Motor Speech Overall Motor Speech: Appears within functional limits for tasks assessed Respiration: Within functional  limits Phonation: Normal Resonance: Within functional limits Articulation: Within functional limitis Intelligibility: Intelligible Motor Planning: Witnin functional limits   GO     Breck Coons Tyjae Shvartsman M.Ed [employer] [phone]  11/09/2013

## 2013-11-09 NOTE — Evaluation (Signed)
Occupational Therapy Evaluation Patient Details Name: Chelsea Lynch MRN: [id number] DOB: September 30, 1925 Today's Date: 11/09/2013 Time: 1478-2956 OT Time Calculation (min): 33 min  OT Assessment / Plan / Recommendation History of present illness 79 year old female with a history of hypertension, paroxysmal atrial fibrillation, AAA, hypothyroidism came to the ED with chief complaint of left side weakness including the left arm and leg along with some left facial weakness. As per patient's daughter the symptoms started yesterday at 5 PM. There was no slurred speech no seizure like activity no headache no double vision there was no difficulty swallowing. Patient did not have any chest pain no shortness of breath no nausea vomiting or diarrhea. Patient has a history of TIA in the past.   Clinical Impression   Pt demos decline in function with ADLs and ADL mobility safety with decreased strength, balance and endurance. Pt would benefit from acute OT services to address impairments to increase level of function and safety. Pt and her dtr stated that they have 24/7 caregivers at home and they plan for her to return home with continued assist from caregivers. Pt's dtr states that pt requires encouragement at home tp participate in her ADLs. Pt pleasant and cooperative    OT Assessment  Patient needs continued OT Services    Follow Up Recommendations  Home health OT;Supervision/Assistance - 24 hour    Barriers to Discharge   none, pt has 24/7 caregivers  Equipment Recommendations  None recommended by OT    Recommendations for Other Services    Frequency  Min 2X/week    Precautions / Restrictions Precautions Precautions: Fall Restrictions Weight Bearing Restrictions: No   Pertinent Vitals/Pain No c/o pain    ADL  Grooming: Performed;Wash/dry hands;Wash/dry face;Min guard Where Assessed - Grooming: Unsupported sitting Upper Body Bathing: Simulated;Minimal assistance Where Assessed - Upper Body  Bathing: Unsupported sitting Lower Body Bathing: Moderate assistance;Simulated Upper Body Dressing: Performed;Minimal assistance Lower Body Dressing: Maximal assistance;Performed Toilet Transfer: Minimal assistance;Moderate assistance;Simulated Toilet Transfer Method: Sit to stand;Stand pivot Toileting - Clothing Manipulation and Hygiene: Maximal assistance Where Assessed - Engineer, mining and Hygiene: Standing Tub/Shower Transfer Method: Not assessed Equipment Used: Gait belt Transfers/Ambulation Related to ADLs: cues for hand placement and sequencing    OT Diagnosis: Generalized weakness  OT Problem List: Decreased strength;Decreased cognition;Decreased activity tolerance;Impaired balance (sitting and/or standing);Decreased knowledge of precautions;Decreased range of motion OT Treatment Interventions: Self-care/ADL training;Patient/family education;Therapeutic exercise;Neuromuscular education;Balance training;Therapeutic activities;DME and/or AE instruction   OT Goals(Current goals can be found in the care plan section) Acute Rehab OT Goals Patient Stated Goal: to go home OT Goal Formulation: With patient/family Time For Goal Achievement: 11/16/13 Potential to Achieve Goals: Good ADL Goals Pt Will Perform Grooming: with supervision;with set-up;sitting Pt Will Perform Upper Body Bathing: with min guard assist;with supervision;with set-up;sitting Pt Will Perform Lower Body Bathing: with min assist;sitting/lateral leans;sit to/from stand Pt Will Perform Upper Body Dressing: with min guard assist;with supervision;with set-up;sitting Pt Will Transfer to Toilet: with min assist;with min guard assist;bedside commode Pt Will Perform Toileting - Clothing Manipulation and hygiene: with mod assist;sitting/lateral leans;sit to/from stand Additional ADL Goal #1: Pt will complete bed mobility with min A to sit EOB in prep for ADLs Additional ADL Goal #2: Pt will perform L UE  AROM/strengthening exercises to increase functional use for A with  ADLs  Visit Information  Last OT Received On: 11/09/13 Assistance Needed: +1 History of Present Illness: 79 year old female with a history of hypertension, paroxysmal atrial fibrillation, AAA, hypothyroidism came to  the ED with chief complaint of left side weakness including the left arm and leg along with some left facial weakness. As per patient's daughter the symptoms started yesterday at 5 PM. There was no slurred speech no seizure like activity no headache no double vision there was no difficulty swallowing. Patient did not have any chest pain no shortness of breath no nausea vomiting or diarrhea. Patient has a history of TIA in the past.       Prior Functioning     Home Living Family/patient expects to be discharged to:: Private residence Living Arrangements: Spouse/significant other Available Help at Discharge: Family;Personal care attendant;Available 24 hours/day Type of Home: House Home Access: Stairs to enter [employer] of Steps: 5 Entrance Stairs-Rails: Right;Left Home Layout: One level Home Equipment: Cane - quad;Walker - 4 wheels;Bedside commode;Shower seat Additional Comments: caregivers  24/7 for ADLs prn , cooking, supervision, laundry, med [employer], grocery shopping  Lives With: Spouse Prior Function Level of Independence: Independent with assistive device(s) Communication Communication: No difficulties Dominant Hand: Right         Vision/Perception Vision - History Baseline Vision: Wears glasses only for reading Patient Visual Report: No change from baseline Perception Perception: Within Functional Limits   Cognition  Cognition Arousal/Alertness: Awake/alert Overall Cognitive Status: History of cognitive impairments - at baseline Area of Impairment: Orientation Orientation Level: Place Memory: Decreased short-term memory    Extremity/Trunk Assessment Upper Extremity  Assessment Upper Extremity Assessment: Overall WFL for tasks assessed;Generalized weakness;LUE deficits/detail LUE Deficits / Details: L shoulder impaired due to old CVA, 60 degrees flexion, can assist with L UE with ADLs Lower Extremity Assessment Lower Extremity Assessment: Defer to PT evaluation     Mobility Bed Mobility Bed Mobility: Supine to Sit;Sitting - Scoot to Edge of Bed Supine to Sit: With rails;HOB elevated;3: Mod assist Details for Bed Mobility Assistance: min - mod A with LEs and trunk Transfers Transfers: Sit to Stand;Stand to Sit Sit to Stand: From bed;4: Min assist;3: Mod assist;With upper extremity assist Stand to Sit: To chair/3-in-1;4: Min assist;With upper extremity assist Details for Transfer Assistance: cues for hand placement and sequencing     Exercise     Balance Balance Balance Assessed: Yes Static Sitting Balance Static Sitting - Balance Support: No upper extremity supported;Feet supported Static Sitting - Level of Assistance: 5: Stand by assistance Dynamic Sitting Balance Dynamic Sitting - Balance Support: No upper extremity supported;During functional activity;Feet unsupported Dynamic Sitting - Level of Assistance: Other (comment) (min guard A)   End of Session OT - End of Session Equipment Utilized During Treatment: Gait belt Activity Tolerance: Patient tolerated treatment well Patient left: in chair;with call bell/phone within reach;with family/visitor present Nurse Communication: Mobility status  GO     Galen Manila 11/09/2013, 2:59 PM

## 2013-11-09 NOTE — Progress Notes (Signed)
[facility] PROGRESS NOTE  Chelsea Lynch:[id number] DOB: 09/12/1925 DOA: 11/08/2013 PCP: [name], MD  Assessment/Plan: 79 y.o. 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 Probable acute R subcortical infarct; CT head: Question recent infarct involving the head of the caudate nucleus on  the right and a portion of the anterior right globus pallidum; exam mild L sided weakness;  -cont CVA work up, obtain CTA, cant do MR; cont ASA, neurology input appreciated;   2. PAF, not on anticoagulation due to gait ataxia, fall;  -cont BB, ASA  3. HTN cont home regimen   4. Hypothyroidism, cont levothyroxine;      Code Status: DNR Family Communication: daughter at the bedside (indicate person spoken with, relationship, and if by phone, the number) Disposition Plan: pend eval;   Consultants:  neurology  Procedures:  CT, MRI head  Antibiotics:  none (indicate start date, and stop date if known)  HPI/Subjective: Alert, but confused  Objective: Filed Vitals:   11/09/13 0808  BP: 155/86  Pulse: 86  Temp: 97.9 F (36.6 C)  Resp: 18    Intake/Output Summary (Last 24 hours) at 11/09/13 8119 Last data filed at 11/08/13 2217  Gross per 24 hour  Intake      3 ml  Output      0 ml  Net      3 ml   There were no vitals filed for this visit.  Exam:   General:  alert  Cardiovascular: s1,s2 rrr  Respiratory: CTA BL  Abdomen: soft, nt, nd   Musculoskeletal: NO edema    Data Reviewed: Basic Metabolic Panel:  Recent Labs Lab 11/08/13 1218  NA 139  K 3.7  CL 101  CO2 29  GLUCOSE 93  BUN 18  CREATININE 1.10  CALCIUM 9.3   Liver Function Tests:  Recent  Labs Lab 11/08/13 1218  AST 15  ALT 12  ALKPHOS 86  BILITOT 0.4  PROT 7.1  ALBUMIN 3.8   No results found for this basename: LIPASE, AMYLASE,  in the last 168 hours No results found for this basename: AMMONIA,  in the last 168 hours CBC:  Recent Labs Lab 11/08/13 1218  WBC 6.8  NEUTROABS 4.7  HGB 13.0  HCT 37.7  MCV 88.7  PLT 214   Cardiac Enzymes:  Recent Labs Lab 11/08/13 1218  TROPONINI <0.30   BNP (last 3 results)  Recent Labs  03/25/13 2035  PROBNP 7189.0*   CBG:  Recent Labs Lab 11/08/13 1225  GLUCAP 94    No results found for this or any previous visit (from the past 240 hour(s)).   Studies: Dg Chest 2 View  11/08/2013   CLINICAL DATA:  Facial droop  EXAM: CHEST  2 VIEW  COMPARISON:  April 01, 2013  FINDINGS: The lungs are clear. The heart size and pulmonary vascularity are normal.  Aorta is prominent with atherosclerotic change, stable. There is a stimulator with the tip at the level of T3, stable. No adenopathy. There is a compression fracture at T12. There is also the anterior wedging of the T7 vertebral body.  IMPRESSION: No edema or consolidation. Thoracic aortic prominence is stable; this finding may indicate a degree of chronic hypertensive type  change. There are compression fractures in the mid and lower thoracic spine.   Electronically Signed   By: Bretta Bang M.D.   On: 11/08/2013 13:59   Ct Head (brain) Wo Contrast  11/08/2013   CLINICAL DATA:  Left-sided facial droop  EXAM: CT HEAD WITHOUT CONTRAST  TECHNIQUE: Contiguous axial images were obtained from the base of the skull through the vertex without intravenous contrast. Study was obtained within 24 hr of patient's arrival at the emergency department.  COMPARISON:  April 01, 2013.  FINDINGS: There is moderate diffuse atrophy, stable. There is no mass, hemorrhage, extra-axial fluid collection, or midline shift.  There is patchy small vessel disease throughout the centra semiovale  bilaterally which appear stable. Compared to the previous study, there is now decreased attenuation and the head of the caudate nucleus on the right and anterior right globus pallidum. Recent infarct in this area must be of concern. No other findings suggesting potential acute infarct.  Bony calvarium appears intact.  The mastoid air cells are clear.  IMPRESSION: Question recent infarct involving the head of the caudate nucleus on the right and a portion of the anterior right globus pallidum. The other areas of decreased attenuation in the supratentorial white matter appear stable compared to the prior study. There is no hemorrhage or mass effect.   Electronically Signed   By: Bretta Bang M.D.   On: 11/08/2013 14:11    Scheduled Meds: . aspirin EC  325 mg Oral Daily  . docusate sodium  100 mg Oral Q supper  . enoxaparin (LOVENOX) injection  40 mg Subcutaneous Q24H  . furosemide  20 mg Oral q morning - 10a  . gabapentin  400 mg Oral TID  . levothyroxine  75 mcg Oral QAC breakfast  . mirabegron ER  50 mg Oral QPM  . nitrofurantoin  100 mg Oral QPM  . OxyCODONE  10 mg Oral Q12H  . potassium chloride  10 mEq Oral QODAY  . sodium chloride  3 mL Intravenous Q12H  . traZODone  100 mg Oral QHS  . venlafaxine XR  75 mg Oral BID  . [START ON 11/15/2013] Vitamin D (Ergocalciferol)  50,000 Units Oral Q7 days   Continuous Infusions:   Active Problems:   Acute CVA (cerebrovascular accident)   CVA (cerebral infarction)    Time spent: >35 minutes    Esperanza Sheets  [facility] Pager [phone]. If 7PM-7AM, please contact night-coverage at [email or web address], password [email or web address] 11/09/2013, 9:27 AM  LOS: 1 day

## 2013-11-10 DIAGNOSIS — I10 Essential (primary) hypertension: Secondary | ICD-10-CM

## 2013-11-10 MED ORDER — ASPIRIN EC 325 MG PO TBEC
325.0000 mg | DELAYED_RELEASE_TABLET | Freq: Every day | ORAL | Status: DC
  Administered 2013-11-10: 325 mg via ORAL
  Filled 2013-11-10: qty 1

## 2013-11-10 MED ORDER — ASPIRIN EC 325 MG PO TBEC: 325.0000 mg | DELAYED_RELEASE_TABLET | Freq: Every day | ORAL | Status: DC

## 2013-11-10 MED ORDER — CLOPIDOGREL BISULFATE 75 MG PO TABS: 75.0000 mg | ORAL_TABLET | Freq: Every day | ORAL | Status: DC

## 2013-11-10 MED ORDER — ATORVASTATIN CALCIUM 10 MG PO TABS: 10.0000 mg | ORAL_TABLET | Freq: Every day | ORAL | Status: DC

## 2013-11-10 NOTE — Discharge Summary (Signed)
Physician Discharge Summary  Chelsea Lynch ZOX:[id number] DOB: [birth year] DOA: 11/08/2013  PCP: [name], MD  Admit date: 11/08/2013 Discharge date: 11/10/2013  Time spent: >35 minutes  Recommendations for Outpatient Follow-up:   Discharge Diagnoses:  Active Problems:   Acute CVA (cerebrovascular accident)   CVA (cerebral infarction)   Discharge Condition: stable   Diet recommendation: heart healthy  Filed Weights   11/10/13 0400  Weight: 61.054 kg (134 lb 9.6 oz)    History of present illness:  79 y.o. 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 Probable acute R subcortical infarct; CT head: Question recent infarct involving the head of the caudate nucleus on  the right and a portion of the anterior right globus pallidum; exam mild L sided weakness; could not do MRI due to spinal cord stimulators; carotids ar e unremarkable;  -per neurology recommendation ASA increased to 325, added statin; cont outpatient follow up;   2. PAF, not on anticoagulation due to gait ataxia, fall;  -cont BB, ASA 3. HTN cont home regimen  4. Hypothyroidism, cont levothyroxine;   Patient is being d/c home with HHC;   Procedures:  CT head, ECHO; LVEF 55%, diastolic dysfunction  (i.e. Studies not automatically included, echos, thoracentesis, etc; not x-rays)  Consultations:  neurolgoy  Discharge Exam: Filed Vitals:   11/10/13 0800  BP: 115/71  Pulse: 78  Temp: 97.4 F (36.3 C)  Resp: 18    General: alert Cardiovascular: s1,s2 rrr Respiratory: CTA BL  Discharge Instructions  Discharge Orders   Future Orders Complete By Expires   Diet - low sodium heart healthy  As  directed    Discharge instructions  As directed    Comments:     Please follow up with neurologist in 1 month   Increase activity slowly  As directed        Medication List    STOP taking these medications       ADVIL PO     aspirin 81 MG tablet  Replaced by:  aspirin EC 325 MG tablet      TAKE these medications       aspirin EC 325 MG tablet  Take 1 tablet (325 mg total) by mouth daily.     atorvastatin 10 MG tablet  Commonly known as:  LIPITOR  Take 1 tablet (10 mg total) by mouth daily at 6 PM.     Biotin 5000 MCG Caps  Take 5,000 mcg by mouth daily.     bisacodyl 5 MG EC tablet  Commonly known as:  DULCOLAX  Take 5 mg by mouth daily as needed for moderate constipation.     bisacodyl 10 MG suppository  Commonly known as:  DULCOLAX  Place 10 mg rectally daily as needed for mild constipation or moderate constipation.     docusate sodium 100 MG capsule  Commonly known as:  COLACE  Take 100 mg by mouth daily with supper.     fish oil-omega-3 fatty acids 1000 MG capsule  Take 2 g by mouth every morning.     furosemide 20 MG tablet  Commonly known as:  LASIX  Take 20 mg by mouth every morning.     gabapentin 400 MG  capsule  Commonly known as:  NEURONTIN  Take 400 mg by mouth 3 (three) times daily.     levothyroxine 75 MCG tablet  Commonly known as:  SYNTHROID, LEVOTHROID  Take 1 tablet (75 mcg total) by mouth daily before breakfast.     MELATONIN PO  Take 1 tablet by mouth at bedtime as needed (for sleep).     metoprolol succinate 50 MG 24 hr tablet  Commonly known as:  TOPROL-XL  Take 25 mg by mouth 2 (two) times daily. Take with or immediately following a meal.     MYRBETRIQ 50 MG Tb24 tablet  Generic drug:  mirabegron ER  Take 50 mg by mouth every evening.     nitrofurantoin 100 MG capsule  Commonly known as:  MACRODANTIN  Take 1 capsule by mouth every evening.     oxyCODONE 10 MG 12 hr tablet  Commonly known as:  OXYCONTIN  Take 10 mg by  mouth every 12 (twelve) hours.     potassium chloride 10 MEQ tablet  Commonly known as:  K-DUR,KLOR-CON  Take 10 mEq by mouth every other day.     traZODone 50 MG tablet  Commonly known as:  DESYREL  Take 100 mg by mouth at bedtime.     venlafaxine XR 75 MG 24 hr capsule  Commonly known as:  EFFEXOR-XR  Take 75 mg by mouth 2 (two) times daily.     Vitamin D (Ergocalciferol) 50000 UNITS Caps capsule  Commonly known as:  DRISDOL  Take 50,000 Units by mouth every 7 (seven) days. wednesdays     zaleplon 10 MG capsule  Commonly known as:  SONATA  Take 10 mg by mouth at bedtime.       Allergies  Allergen Reactions  . Clarithromycin Other (See Comments)    Unknown   . Codeine Other (See Comments)    Unknown   . Penicillins Other (See Comments)    Unknown   . Tequin [Gatifloxacin] Other (See Comments)    Unknown        Follow-up Information   Follow up with [name], MD In 1 week.   Specialty:  Internal Medicine   Contact information:   [address] [place] [address] [place] [address] [phone]        The results of significant diagnostics from this hospitalization (including imaging, microbiology, ancillary and laboratory) are listed below for reference.    Significant Diagnostic Studies: Dg Chest 2 View  11/08/2013   CLINICAL DATA:  Facial droop  EXAM: CHEST  2 VIEW  COMPARISON:  April 01, 2013  FINDINGS: The lungs are clear. The heart size and pulmonary vascularity are normal.  Aorta is prominent with atherosclerotic change, stable. There is a stimulator with the tip at the level of T3, stable. No adenopathy. There is a compression fracture at T12. There is also the anterior wedging of the T7 vertebral body.  IMPRESSION: No edema or consolidation. Thoracic aortic prominence is stable; this finding may indicate a degree of chronic hypertensive type change. There are compression fractures in the mid and lower thoracic spine.   Electronically Signed    By: Bretta Bang M.D.   On: 11/08/2013 13:59   Ct Head Wo Contrast  11/09/2013   CLINICAL DATA:  Stroke  EXAM: CT HEAD WITHOUT CONTRAST  TECHNIQUE: Contiguous axial images were obtained from the base of the skull through the vertex without intravenous contrast.  COMPARISON:  CT 11/08/2013  FINDINGS: Hypodensity in the right anterior basal  ganglia is unchanged and may represent acute infarct. This was not present on 04/01/2013. No other areas of acute infarct.  Generalized atrophy. Chronic microvascular ischemic change in the white matter. Negative for hemorrhage or mass. No shift of the midline structures.  IMPRESSION: Hypodensity right anterior basal ganglia is unchanged and may represent recent infarction. Negative for hemorrhage.   Electronically Signed   By: Marlan Palau M.D.   On: 11/09/2013 10:02   Ct Head (brain) Wo Contrast  11/08/2013   CLINICAL DATA:  Left-sided facial droop  EXAM: CT HEAD WITHOUT CONTRAST  TECHNIQUE: Contiguous axial images were obtained from the base of the skull through the vertex without intravenous contrast. Study was obtained within 24 hr of patient's arrival at the emergency department.  COMPARISON:  April 01, 2013.  FINDINGS: There is moderate diffuse atrophy, stable. There is no mass, hemorrhage, extra-axial fluid collection, or midline shift.  There is patchy small vessel disease throughout the centra semiovale bilaterally which appear stable. Compared to the previous study, there is now decreased attenuation and the head of the caudate nucleus on the right and anterior right globus pallidum. Recent infarct in this area must be of concern. No other findings suggesting potential acute infarct.  Bony calvarium appears intact.  The mastoid air cells are clear.  IMPRESSION: Question recent infarct involving the head of the caudate nucleus on the right and a portion of the anterior right globus pallidum. The other areas of decreased attenuation in the supratentorial  white matter appear stable compared to the prior study. There is no hemorrhage or mass effect.   Electronically Signed   By: Bretta Bang M.D.   On: 11/08/2013 14:11    Microbiology: No results found for this or any previous visit (from the past 240 hour(s)).   Labs: Basic Metabolic Panel:  Recent Labs Lab 11/08/13 1218  NA 139  K 3.7  CL 101  CO2 29  GLUCOSE 93  BUN 18  CREATININE 1.10  CALCIUM 9.3   Liver Function Tests:  Recent Labs Lab 11/08/13 1218  AST 15  ALT 12  ALKPHOS 86  BILITOT 0.4  PROT 7.1  ALBUMIN 3.8   No results found for this basename: LIPASE, AMYLASE,  in the last 168 hours No results found for this basename: AMMONIA,  in the last 168 hours CBC:  Recent Labs Lab 11/08/13 1218  WBC 6.8  NEUTROABS 4.7  HGB 13.0  HCT 37.7  MCV 88.7  PLT 214   Cardiac Enzymes:  Recent Labs Lab 11/08/13 1218  TROPONINI <0.30   BNP: BNP (last 3 results)  Recent Labs  03/25/13 2035  PROBNP 7189.0*   CBG:  Recent Labs Lab 11/08/13 1225  GLUCAP 94       Signed:  Montine Hight N  [facility] 11/10/2013, 9:12 AM

## 2013-11-10 NOTE — Progress Notes (Signed)
VASCULAR LAB PRELIMINARY  PRELIMINARY  PRELIMINARY  PRELIMINARY  TCD completed.     Aamirah Salmi, RVT 11/10/2013, 1:08 PM

## 2013-11-10 NOTE — Progress Notes (Signed)
Stroke Team Progress Note  HISTORY Chelsea Lynch is an 79 y.o. 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   She and her family stated that those symptoms became evident last night, at least after 6 pm 11/07/2013.  Chelsea Lynch tells me that she has slurred speech and left face-arm weakness from prior stroke, but last night noted that she was having trouble using her left side. Denies HA, vertigo, double vision, difficulty swallowing, confusion.   Takes aspirin 81 mg daily.   CT brain showed a probable right BG lacunar infarct  Patient was not a TPA candidate secondary to symptoms out of window. She was admitted for further evaluation and treatment.   SUBJECTIVE Patient lying in bed. No new neurological symptoms or worsening. Daughter in room.   OBJECTIVE Most recent Vital Signs: Filed Vitals:   11/09/13 0808 11/09/13 2000 11/10/13 0000 11/10/13 0400  BP: 155/86 111/78 112/64 133/84  Pulse: 86 72 83 85  Temp: 97.9 F (36.6 C) 97.6 F (36.4 C)  97.6 F (36.4 C)  TempSrc:  Axillary  Axillary  Resp: 18     Height:      Weight:    61.054 kg (134 lb 9.6 oz)  SpO2: 99% 94% 93% 97%   CBG (last 3)   Recent Labs  11/08/13 1225  GLUCAP 94    IV Fluid Intake:     MEDICATIONS  . atorvastatin  10 mg Oral q1800  . clopidogrel  75 mg Oral Q breakfast  . docusate sodium  100 mg Oral Q supper  . enoxaparin (LOVENOX) injection  40 mg Subcutaneous Q24H  . furosemide  20 mg Oral q morning - 10a  . gabapentin  400 mg Oral TID  . levothyroxine  75 mcg Oral QAC breakfast  . mirabegron ER  50 mg Oral QPM  . nitrofurantoin  100 mg Oral QPM  . OxyCODONE  10 mg Oral Q12H  . potassium chloride  10 mEq Oral QODAY  . sodium chloride  3 mL Intravenous Q12H  .  traZODone  100 mg Oral QHS  . venlafaxine XR  75 mg Oral BID  . [START ON 11/15/2013] Vitamin D (Ergocalciferol)  50,000 Units Oral Q7 days   PRN:  sodium chloride, acetaminophen, bisacodyl, bisacodyl, sodium chloride, zolpidem  Diet:  Cardiac thin liquids Activity:  Ambulate with assist DVT Prophylaxis:  lovenox  CLINICALLY SIGNIFICANT STUDIES Basic Metabolic Panel:   Recent Labs Lab 11/08/13 1218  NA 139  K 3.7  CL 101  CO2 29  GLUCOSE 93  BUN 18  CREATININE 1.10  CALCIUM 9.3   Liver Function Tests:   Recent Labs Lab 11/08/13 1218  AST 15  ALT 12  ALKPHOS 86  BILITOT 0.4  PROT 7.1  ALBUMIN 3.8   CBC:   Recent Labs Lab 11/08/13 1218  WBC 6.8  NEUTROABS 4.7  HGB 13.0  HCT 37.7  MCV 88.7  PLT 214   Coagulation:   Recent Labs Lab 11/08/13 1218  LABPROT 12.0  INR 0.90   Cardiac Enzymes:   Recent Labs Lab 11/08/13 1218  TROPONINI <0.30   Urinalysis:   Recent Labs Lab 11/08/13 1443  COLORURINE YELLOW  LABSPEC 1.007  PHURINE 7.0  GLUCOSEU NEGATIVE  HGBUR NEGATIVE  BILIRUBINUR NEGATIVE  KETONESUR NEGATIVE  PROTEINUR NEGATIVE  UROBILINOGEN 0.2  NITRITE NEGATIVE  LEUKOCYTESUR NEGATIVE   Lipid Panel    Component Value Date/Time   CHOL 188 11/09/2013 0358   TRIG 67 11/09/2013 0358   HDL 64 11/09/2013 0358   CHOLHDL 2.9 11/09/2013 0358   VLDL 13 11/09/2013 0358   LDLCALC 111* 11/09/2013 0358   HgbA1C  Lab Results  Component Value Date   HGBA1C 5.8* 11/08/2013    Urine Drug Screen:     Component Value Date/Time   LABOPIA NONE DETECTED 03/25/2013 2254   COCAINSCRNUR NONE DETECTED 03/25/2013 2254   LABBENZ NONE DETECTED 03/25/2013 2254   AMPHETMU NONE DETECTED 03/25/2013 2254   THCU NONE DETECTED 03/25/2013 2254   LABBARB NONE DETECTED 03/25/2013 2254    Alcohol Level: No results found for this basename: ETH,  in the last 168 hours  Dg Chest 2 View 11/08/2013   No edema or consolidation. Thoracic aortic prominence is stable; this finding  may indicate a degree of chronic hypertensive type change. There are compression fractures in the mid and lower thoracic spine.   Ct Head (brain) Wo Contrast 11/08/2013  Question recent infarct involving the head of the caudate nucleus on the right and a portion of the anterior right globus pallidum. The other areas of decreased attenuation in the supratentorial white matter appear stable compared to the prior study. There is no hemorrhage or mass effect.   11/09/2013  Hypodensity right anterior basal ganglia is unchanged and may represent recent infarction. Negative for hemorrhage.   TCD:  MRI of the brain  (spinal cord stimulator)  MRA of the brain  (spinal cord stimulator)  2D Echocardiogram  EF 50%, Possible mild hypokinesis of the distalanteroseptal myocardium, LA size upper limits of normal.  Carotid Doppler  Preliminary report: There is 1-39% ICA stenosis. Vertebral artery flow is antegrade.  CXR    EKG  normal sinus rhythm.   Therapy Recommendations HOME HEALTH  Physical Exam   Mental Status:  Alert, oriented, thought content appropriate. Mild dysarthria without evidence of aphasia. Able to follow 3 step commands without difficulty.  Cranial Nerves:  II: Discs flat bilaterally; Visual fields grossly normal, pupils equal, round, reactive to light and accommodation  III,IV, VI: ptosis not present, extra-ocular motions intact bilaterally  V,VII: smile asymmetric with mild left face weakness, facial light touch sensation normal bilaterally  VIII: hearing normal bilaterally  IX,X: gag reflex present  XI: bilateral shoulder shrug  XII: midline tongue extension without atrophy or fasciculations  Motor:  Left hemiparesis, arm greater than leg.  Tone increased left arm.  Sensory: Pinprick and light touch intact throughout, bilaterally  Deep Tendon Reflexes:  Right: Upper Extremity Left: Upper extremity  biceps (C-5 to C-6) 2/4 biceps (C-5 to C-6) 2/4  tricep (C7) 2/4 triceps (C7)  2/4  Brachioradialis (C6) 2/4 Brachioradialis (C6) 2/4  Lower Extremity Lower Extremity  quadriceps (L-2 to L-4) 2/4 quadriceps (L-2 to L-4) 2/4  Achilles (S1) 2/4 Achilles (S1) 2/4  Plantars:  Right: downgoing Left: downgoing  Cerebellar:  normal finger-to-nose, normal heel-to-shin test  Gait:  No tested.  CV: pulses palpable throughout    ASSESSMENT Chelsea Lynch is a 79 y.o. female presenting with left hemiparesis (worse than usual). Imaging confirms hypodensity right anterior basal ganglia concerning for acute infarct. Infarct felt to be thrombotic secondary to small vessel disease.  On aspirin 81 mg orally every day prior to admission. Now on plavix 75mg  daily for secondary stroke prevention. Patient with resultant  left hemiparesis. Work up underway.   Hyperlipidemia, LDL 111, goal < 100, on fish oil prior to admission, add statin. She states that she has never had trouble with taking statin medications, liver function is normal.  HGB A1C   5.8  Paroxysmal atrial fibrillation: patient felt to be fall risk and was not anticoagulated around 2011  Hypertension  Long term medication use   Hospital day # 2  TREATMENT/PLAN  Change to aspirin 325mg  daily for secondary stroke prevention since she has history of atrial fibrillation. She is not a candidate for anticoagulation/NOAC due to fall risk.  Home Health recommended  TCD pending, will get done prior to discharge, if unable can cancel and can follow up in office  Have patient follow up with Dr. Pearlean Brownie in 2 months in stroke clinic.  Risk factor modification  Dr. Pearlean Brownie discussed plan of care with Dr. York Spaniel, patient and daughter.  Gwendolyn Lima. Manson Passey, [facility], MBA, MHA [facility] Pager: [phone] 11/10/2013 9:19 AM  I have personally obtained a history, examined the patient, evaluated imaging results, and formulated the assessment and plan of care. I agree with the above.  Delia Heady, MD

## 2013-11-10 NOTE — Progress Notes (Signed)
Pt and pt daughter provided with dc instructions and education. All questions answered. IV removed with tip intact. Heart monitor cleaned and returned to front. Pt leaving in wheelchair for home with family. Levonne Spiller, RN

## 2013-12-19 ENCOUNTER — Other Ambulatory Visit (HOSPITAL_COMMUNITY): Payer: Medicare Other | Admitting: Cardiology

## 2013-12-19 ENCOUNTER — Ambulatory Visit (HOSPITAL_COMMUNITY)
Admission: RE | Admit: 2013-12-19 | Discharge: 2013-12-19 | Disposition: A | Discharge status: Home / Self Care | Payer: Medicare Other | Source: Ambulatory Visit | Attending: Cardiovascular Disease | Admitting: Cardiovascular Disease

## 2013-12-19 DIAGNOSIS — I714 Abdominal aortic aneurysm, without rupture, unspecified: Secondary | ICD-10-CM

## 2013-12-19 DIAGNOSIS — I10 Essential (primary) hypertension: Secondary | ICD-10-CM

## 2013-12-19 NOTE — Progress Notes (Signed)
Aorta Duplex Completed. Louvenia Golomb, BS, RDMS, RVT  

## 2014-01-08 ENCOUNTER — Inpatient Hospital Stay (HOSPITAL_COMMUNITY): Payer: Medicare Other

## 2014-01-08 ENCOUNTER — Emergency Department (HOSPITAL_COMMUNITY): Payer: Medicare Other

## 2014-01-08 ENCOUNTER — Encounter (HOSPITAL_COMMUNITY): Payer: Self-pay | Admitting: Emergency Medicine

## 2014-01-08 ENCOUNTER — Inpatient Hospital Stay (HOSPITAL_COMMUNITY)
Admission: EM | Admit: 2014-01-08 | Discharge: 2014-01-11 | DRG: 178 | Disposition: A | Discharge status: Home / Self Care | Payer: Medicare Other | Attending: Internal Medicine | Admitting: Internal Medicine

## 2014-01-08 DIAGNOSIS — F0391 Unspecified dementia with behavioral disturbance: Secondary | ICD-10-CM

## 2014-01-08 DIAGNOSIS — N390 Urinary tract infection, site not specified: Secondary | ICD-10-CM

## 2014-01-08 DIAGNOSIS — F329 Major depressive disorder, single episode, unspecified: Secondary | ICD-10-CM | POA: Diagnosis present

## 2014-01-08 DIAGNOSIS — F3289 Other specified depressive episodes: Secondary | ICD-10-CM | POA: Diagnosis present

## 2014-01-08 DIAGNOSIS — G479 Sleep disorder, unspecified: Secondary | ICD-10-CM | POA: Diagnosis present

## 2014-01-08 DIAGNOSIS — D72829 Elevated white blood cell count, unspecified: Secondary | ICD-10-CM | POA: Diagnosis present

## 2014-01-08 DIAGNOSIS — I714 Abdominal aortic aneurysm, without rupture, unspecified: Secondary | ICD-10-CM | POA: Diagnosis present

## 2014-01-08 DIAGNOSIS — K5289 Other specified noninfective gastroenteritis and colitis: Secondary | ICD-10-CM | POA: Diagnosis present

## 2014-01-08 DIAGNOSIS — R5381 Other malaise: Secondary | ICD-10-CM | POA: Diagnosis present

## 2014-01-08 DIAGNOSIS — F03918 Unspecified dementia, unspecified severity, with other behavioral disturbance: Secondary | ICD-10-CM

## 2014-01-08 DIAGNOSIS — R1110 Vomiting, unspecified: Secondary | ICD-10-CM

## 2014-01-08 DIAGNOSIS — J690 Pneumonitis due to inhalation of food and vomit: Principal | ICD-10-CM | POA: Diagnosis present

## 2014-01-08 DIAGNOSIS — I10 Essential (primary) hypertension: Secondary | ICD-10-CM | POA: Diagnosis present

## 2014-01-08 DIAGNOSIS — M4800 Spinal stenosis, site unspecified: Secondary | ICD-10-CM | POA: Diagnosis present

## 2014-01-08 DIAGNOSIS — E860 Dehydration: Secondary | ICD-10-CM | POA: Diagnosis present

## 2014-01-08 DIAGNOSIS — Z823 Family history of stroke: Secondary | ICD-10-CM

## 2014-01-08 DIAGNOSIS — Z66 Do not resuscitate: Secondary | ICD-10-CM | POA: Diagnosis present

## 2014-01-08 DIAGNOSIS — K2900 Acute gastritis without bleeding: Secondary | ICD-10-CM | POA: Diagnosis present

## 2014-01-08 DIAGNOSIS — K5900 Constipation, unspecified: Secondary | ICD-10-CM | POA: Diagnosis present

## 2014-01-08 DIAGNOSIS — G8929 Other chronic pain: Secondary | ICD-10-CM | POA: Diagnosis present

## 2014-01-08 DIAGNOSIS — F411 Generalized anxiety disorder: Secondary | ICD-10-CM | POA: Diagnosis present

## 2014-01-08 DIAGNOSIS — Z8249 Family history of ischemic heart disease and other diseases of the circulatory system: Secondary | ICD-10-CM

## 2014-01-08 DIAGNOSIS — R531 Weakness: Secondary | ICD-10-CM

## 2014-01-08 DIAGNOSIS — Z9181 History of falling: Secondary | ICD-10-CM

## 2014-01-08 DIAGNOSIS — Z7982 Long term (current) use of aspirin: Secondary | ICD-10-CM

## 2014-01-08 DIAGNOSIS — Z8673 Personal history of transient ischemic attack (TIA), and cerebral infarction without residual deficits: Secondary | ICD-10-CM

## 2014-01-08 DIAGNOSIS — N12 Tubulo-interstitial nephritis, not specified as acute or chronic: Secondary | ICD-10-CM | POA: Diagnosis present

## 2014-01-08 DIAGNOSIS — R1310 Dysphagia, unspecified: Secondary | ICD-10-CM

## 2014-01-08 DIAGNOSIS — N9489 Other specified conditions associated with female genital organs and menstrual cycle: Secondary | ICD-10-CM | POA: Diagnosis present

## 2014-01-08 DIAGNOSIS — I639 Cerebral infarction, unspecified: Secondary | ICD-10-CM

## 2014-01-08 DIAGNOSIS — R5383 Other fatigue: Secondary | ICD-10-CM

## 2014-01-08 DIAGNOSIS — K219 Gastro-esophageal reflux disease without esophagitis: Secondary | ICD-10-CM

## 2014-01-08 DIAGNOSIS — I480 Paroxysmal atrial fibrillation: Secondary | ICD-10-CM

## 2014-01-08 DIAGNOSIS — J3489 Other specified disorders of nose and nasal sinuses: Secondary | ICD-10-CM | POA: Diagnosis present

## 2014-01-08 DIAGNOSIS — M87059 Idiopathic aseptic necrosis of unspecified femur: Secondary | ICD-10-CM | POA: Diagnosis present

## 2014-01-08 DIAGNOSIS — R0602 Shortness of breath: Secondary | ICD-10-CM

## 2014-01-08 DIAGNOSIS — I4891 Unspecified atrial fibrillation: Secondary | ICD-10-CM | POA: Diagnosis present

## 2014-01-08 DIAGNOSIS — Z87891 Personal history of nicotine dependence: Secondary | ICD-10-CM

## 2014-01-08 DIAGNOSIS — M129 Arthropathy, unspecified: Secondary | ICD-10-CM | POA: Diagnosis present

## 2014-01-08 DIAGNOSIS — E039 Hypothyroidism, unspecified: Secondary | ICD-10-CM | POA: Diagnosis present

## 2014-01-08 DIAGNOSIS — J189 Pneumonia, unspecified organism: Secondary | ICD-10-CM | POA: Diagnosis present

## 2014-01-08 DIAGNOSIS — G4730 Sleep apnea, unspecified: Secondary | ICD-10-CM | POA: Diagnosis present

## 2014-01-08 DIAGNOSIS — G609 Hereditary and idiopathic neuropathy, unspecified: Secondary | ICD-10-CM | POA: Diagnosis present

## 2014-01-08 LAB — CBC WITH DIFFERENTIAL/PLATELET
BASOS ABS: 0 10*3/uL (ref 0.0–0.1)
Basophils Relative: 0 % (ref 0–1)
Eosinophils Absolute: 0 10*3/uL (ref 0.0–0.7)
Eosinophils Relative: 0 % (ref 0–5)
HEMATOCRIT: 36.7 % (ref 36.0–46.0)
Hemoglobin: 12.9 g/dL (ref 12.0–15.0)
Lymphocytes Relative: 2 % — ABNORMAL LOW (ref 12–46)
Lymphs Abs: 0.2 10*3/uL — ABNORMAL LOW (ref 0.7–4.0)
MCH: 31 pg (ref 26.0–34.0)
MCHC: 35.1 g/dL (ref 30.0–36.0)
MCV: 88.2 fL (ref 78.0–100.0)
Monocytes Absolute: 0.7 10*3/uL (ref 0.1–1.0)
Monocytes Relative: 7 % (ref 3–12)
NEUTROS ABS: 10 10*3/uL — AB (ref 1.7–7.7)
Neutrophils Relative %: 91 % — ABNORMAL HIGH (ref 43–77)
PLATELETS: 215 10*3/uL (ref 150–400)
RBC: 4.16 MIL/uL (ref 3.87–5.11)
RDW: 13.5 % (ref 11.5–15.5)
WBC: 11 10*3/uL — ABNORMAL HIGH (ref 4.0–10.5)

## 2014-01-08 LAB — URINALYSIS W MICROSCOPIC + REFLEX CULTURE
Bilirubin Urine: NEGATIVE
Glucose, UA: NEGATIVE mg/dL
Hgb urine dipstick: NEGATIVE
Ketones, ur: NEGATIVE mg/dL
NITRITE: POSITIVE — AB
PROTEIN: NEGATIVE mg/dL
SPECIFIC GRAVITY, URINE: 1.029 (ref 1.005–1.030)
UROBILINOGEN UA: 0.2 mg/dL (ref 0.0–1.0)
pH: 7.5 (ref 5.0–8.0)

## 2014-01-08 LAB — COMPREHENSIVE METABOLIC PANEL
ALBUMIN: 3.7 g/dL (ref 3.5–5.2)
ALT: 15 U/L (ref 0–35)
AST: 18 U/L (ref 0–37)
Alkaline Phosphatase: 74 U/L (ref 39–117)
BILIRUBIN TOTAL: 0.3 mg/dL (ref 0.3–1.2)
BUN: 26 mg/dL — AB (ref 6–23)
CHLORIDE: 98 meq/L (ref 96–112)
CO2: 22 mEq/L (ref 19–32)
CREATININE: 1.16 mg/dL — AB (ref 0.50–1.10)
Calcium: 8.9 mg/dL (ref 8.4–10.5)
GFR, EST AFRICAN AMERICAN: 48 mL/min — AB (ref >90)
GFR, EST NON AFRICAN AMERICAN: 41 mL/min — AB (ref >90)
Glucose, Bld: 131 mg/dL — ABNORMAL HIGH (ref 70–99)
Potassium: 3.7 mEq/L (ref 3.7–5.3)
Sodium: 139 mEq/L (ref 137–147)
Total Protein: 7 g/dL (ref 6.0–8.3)

## 2014-01-08 LAB — TROPONIN I

## 2014-01-08 LAB — LIPASE, BLOOD: LIPASE: 21 U/L (ref 11–59)

## 2014-01-08 LAB — LACTIC ACID, PLASMA: LACTIC ACID, VENOUS: 1.5 mmol/L (ref 0.5–2.2)

## 2014-01-08 MED ORDER — VITAMIN D (ERGOCALCIFEROL) 1.25 MG (50000 UNIT) PO CAPS
50000.0000 [IU] | ORAL_CAPSULE | ORAL | Status: DC
  Administered 2014-01-10: 50000 [IU] via ORAL
  Filled 2014-01-08: qty 1

## 2014-01-08 MED ORDER — VANCOMYCIN HCL IN DEXTROSE 1-5 GM/200ML-% IV SOLN
1000.0000 mg | INTRAVENOUS | Status: DC
  Administered 2014-01-09: 1000 mg via INTRAVENOUS
  Filled 2014-01-08 (×2): qty 200

## 2014-01-08 MED ORDER — CEFEPIME HCL 1 G IJ SOLR
1.0000 g | Freq: Two times a day (BID) | INTRAMUSCULAR | Status: DC
  Administered 2014-01-08: 1 g via INTRAVENOUS
  Filled 2014-01-08 (×2): qty 1

## 2014-01-08 MED ORDER — SODIUM CHLORIDE 0.9 % IV BOLUS (SEPSIS)
500.0000 mL | Freq: Once | INTRAVENOUS | Status: AC
  Administered 2014-01-08: 500 mL via INTRAVENOUS

## 2014-01-08 MED ORDER — OXYCODONE HCL 5 MG PO TABS
10.0000 mg | ORAL_TABLET | ORAL | Status: DC | PRN
  Administered 2014-01-08 – 2014-01-11 (×3): 10 mg via ORAL
  Filled 2014-01-08 (×4): qty 2

## 2014-01-08 MED ORDER — DEXTROSE 5 % IV SOLN
1.0000 g | INTRAVENOUS | Status: DC
Start: 2014-01-09 — End: 2014-01-10
  Administered 2014-01-09 – 2014-01-10 (×2): 1 g via INTRAVENOUS
  Filled 2014-01-08 (×3): qty 1

## 2014-01-08 MED ORDER — ONDANSETRON HCL 4 MG PO TABS: 4.0000 mg | ORAL_TABLET | Freq: Four times a day (QID) | ORAL | Status: DC | PRN

## 2014-01-08 MED ORDER — OMEGA-3 FATTY ACIDS 1000 MG PO CAPS
2.0000 g | ORAL_CAPSULE | Freq: Every morning | ORAL | Status: DC
  Administered 2014-01-09 – 2014-01-10 (×2): 2 g via ORAL
  Filled 2014-01-08 (×3): qty 2

## 2014-01-08 MED ORDER — SODIUM CHLORIDE 0.9 % IJ SOLN: 3.0000 mL | Freq: Two times a day (BID) | INTRAMUSCULAR | Status: DC

## 2014-01-08 MED ORDER — METOPROLOL SUCCINATE ER 25 MG PO TB24
25.0000 mg | ORAL_TABLET | Freq: Two times a day (BID) | ORAL | Status: DC
  Administered 2014-01-08 – 2014-01-11 (×6): 25 mg via ORAL
  Filled 2014-01-08 (×9): qty 1

## 2014-01-08 MED ORDER — DOCUSATE SODIUM 100 MG PO CAPS
200.0000 mg | ORAL_CAPSULE | Freq: Two times a day (BID) | ORAL | Status: DC
  Administered 2014-01-08 – 2014-01-11 (×5): 200 mg via ORAL
  Filled 2014-01-08 (×7): qty 2

## 2014-01-08 MED ORDER — METRONIDAZOLE IN NACL 5-0.79 MG/ML-% IV SOLN
500.0000 mg | Freq: Three times a day (TID) | INTRAVENOUS | Status: DC
  Administered 2014-01-08 – 2014-01-10 (×5): 500 mg via INTRAVENOUS
  Filled 2014-01-08 (×7): qty 100

## 2014-01-08 MED ORDER — VANCOMYCIN HCL IN DEXTROSE 1-5 GM/200ML-% IV SOLN
1000.0000 mg | INTRAVENOUS | Status: AC
  Administered 2014-01-08: 1000 mg via INTRAVENOUS
  Filled 2014-01-08 (×2): qty 200

## 2014-01-08 MED ORDER — FLUTICASONE PROPIONATE 50 MCG/ACT NA SUSP
2.0000 | Freq: Every day | NASAL | Status: DC
  Administered 2014-01-09 – 2014-01-11 (×3): 2 via NASAL
  Filled 2014-01-08: qty 16

## 2014-01-08 MED ORDER — ENOXAPARIN SODIUM 40 MG/0.4ML ~~LOC~~ SOLN
40.0000 mg | SUBCUTANEOUS | Status: DC
  Administered 2014-01-08 – 2014-01-10 (×3): 40 mg via SUBCUTANEOUS
  Filled 2014-01-08 (×4): qty 0.4

## 2014-01-08 MED ORDER — ASPIRIN EC 325 MG PO TBEC
325.0000 mg | DELAYED_RELEASE_TABLET | Freq: Every day | ORAL | Status: DC
  Administered 2014-01-08 – 2014-01-11 (×4): 325 mg via ORAL
  Filled 2014-01-08 (×4): qty 1

## 2014-01-08 MED ORDER — BISACODYL 10 MG RE SUPP: 10.0000 mg | Freq: Every day | RECTAL | Status: DC | PRN

## 2014-01-08 MED ORDER — BIOTIN 5000 MCG PO CAPS: 5000.0000 ug | ORAL_CAPSULE | Freq: Every day | ORAL | Status: DC

## 2014-01-08 MED ORDER — LEVOTHYROXINE SODIUM 75 MCG PO TABS
75.0000 ug | ORAL_TABLET | Freq: Every day | ORAL | Status: DC
  Administered 2014-01-08 – 2014-01-11 (×4): 75 ug via ORAL
  Filled 2014-01-08 (×5): qty 1

## 2014-01-08 MED ORDER — MELATONIN 5 MG PO CAPS: 4.0000 | ORAL_CAPSULE | Freq: Every evening | ORAL | Status: DC | PRN

## 2014-01-08 MED ORDER — SODIUM CHLORIDE 0.9 % IV SOLN
INTRAVENOUS | Status: DC
Start: 2014-01-08 — End: 2014-01-11
  Administered 2014-01-08 – 2014-01-10 (×3): via INTRAVENOUS

## 2014-01-08 MED ORDER — POTASSIUM CHLORIDE CRYS ER 10 MEQ PO TBCR
10.0000 meq | EXTENDED_RELEASE_TABLET | ORAL | Status: DC
  Administered 2014-01-08 – 2014-01-10 (×2): 10 meq via ORAL
  Filled 2014-01-08 (×2): qty 1

## 2014-01-08 MED ORDER — GUAIFENESIN-DM 100-10 MG/5ML PO SYRP: 5.0000 mL | ORAL_SOLUTION | ORAL | Status: DC | PRN

## 2014-01-08 MED ORDER — POLYETHYLENE GLYCOL 3350 17 G PO PACK
17.0000 g | PACK | Freq: Every day | ORAL | Status: DC
  Administered 2014-01-08 – 2014-01-11 (×4): 17 g via ORAL
  Filled 2014-01-08 (×4): qty 1

## 2014-01-08 MED ORDER — IOHEXOL 300 MG/ML  SOLN
100.0000 mL | Freq: Once | INTRAMUSCULAR | Status: AC | PRN
  Administered 2014-01-08: 80 mL via INTRAVENOUS

## 2014-01-08 MED ORDER — ACETAMINOPHEN 500 MG PO TABS
1000.0000 mg | ORAL_TABLET | Freq: Three times a day (TID) | ORAL | Status: DC | PRN
  Administered 2014-01-09: 1000 mg via ORAL
  Filled 2014-01-08: qty 2

## 2014-01-08 MED ORDER — VENLAFAXINE HCL ER 75 MG PO CP24
75.0000 mg | ORAL_CAPSULE | Freq: Two times a day (BID) | ORAL | Status: DC
  Administered 2014-01-08 – 2014-01-11 (×6): 75 mg via ORAL
  Filled 2014-01-08 (×7): qty 1

## 2014-01-08 MED ORDER — BISACODYL 5 MG PO TBEC: 5.0000 mg | DELAYED_RELEASE_TABLET | Freq: Every day | ORAL | Status: DC | PRN

## 2014-01-08 MED ORDER — BISACODYL 10 MG RE SUPP
10.0000 mg | Freq: Once | RECTAL | Status: DC
  Filled 2014-01-08: qty 1

## 2014-01-08 MED ORDER — ONDANSETRON HCL 4 MG/2ML IJ SOLN
4.0000 mg | Freq: Once | INTRAMUSCULAR | Status: AC
  Administered 2014-01-08: 4 mg via INTRAVENOUS
  Filled 2014-01-08: qty 2

## 2014-01-08 MED ORDER — ONDANSETRON HCL 4 MG/2ML IJ SOLN
4.0000 mg | Freq: Four times a day (QID) | INTRAMUSCULAR | Status: DC | PRN
Start: 2014-01-08 — End: 2014-01-09
  Administered 2014-01-08 – 2014-01-09 (×2): 4 mg via INTRAVENOUS
  Filled 2014-01-08 (×2): qty 2

## 2014-01-08 MED ORDER — SODIUM CHLORIDE 0.9 % IV SOLN
INTRAVENOUS | Status: AC
  Administered 2014-01-08: 16:00:00 via INTRAVENOUS

## 2014-01-08 MED ORDER — IOHEXOL 300 MG/ML  SOLN
50.0000 mL | Freq: Once | INTRAMUSCULAR | Status: AC | PRN
  Administered 2014-01-08: 50 mL via ORAL

## 2014-01-08 MED ORDER — MIRABEGRON ER 50 MG PO TB24
50.0000 mg | ORAL_TABLET | Freq: Every evening | ORAL | Status: DC
  Administered 2014-01-08 – 2014-01-10 (×3): 50 mg via ORAL
  Filled 2014-01-08 (×4): qty 1

## 2014-01-08 MED ORDER — DEXTROSE 5 % IV SOLN: 1.0000 g | Freq: Once | INTRAVENOUS | Status: DC

## 2014-01-08 MED ORDER — ZOLPIDEM TARTRATE 5 MG PO TABS: 5.0000 mg | ORAL_TABLET | Freq: Every evening | ORAL | Status: DC | PRN

## 2014-01-08 MED ORDER — TRAZODONE HCL 100 MG PO TABS
100.0000 mg | ORAL_TABLET | Freq: Every day | ORAL | Status: DC
  Administered 2014-01-08 – 2014-01-10 (×3): 100 mg via ORAL
  Filled 2014-01-08 (×4): qty 1

## 2014-01-08 MED ORDER — GABAPENTIN 400 MG PO CAPS
400.0000 mg | ORAL_CAPSULE | Freq: Three times a day (TID) | ORAL | Status: DC
  Administered 2014-01-08 – 2014-01-11 (×9): 400 mg via ORAL
  Filled 2014-01-08 (×11): qty 1

## 2014-01-08 MED ORDER — OXYMETAZOLINE HCL 0.05 % NA SOLN
1.0000 | Freq: Two times a day (BID) | NASAL | Status: AC
  Administered 2014-01-08 – 2014-01-11 (×6): 1 via NASAL
  Filled 2014-01-08: qty 15

## 2014-01-08 MED ORDER — SALINE SPRAY 0.65 % NA SOLN
1.0000 | NASAL | Status: DC | PRN
  Filled 2014-01-08: qty 44

## 2014-01-08 MED ORDER — MORPHINE SULFATE 4 MG/ML IJ SOLN
4.0000 mg | Freq: Once | INTRAMUSCULAR | Status: AC
  Administered 2014-01-08: 4 mg via INTRAVENOUS
  Filled 2014-01-08: qty 1

## 2014-01-08 MED ORDER — OXYCODONE HCL ER 10 MG PO T12A
10.0000 mg | EXTENDED_RELEASE_TABLET | Freq: Two times a day (BID) | ORAL | Status: DC
  Administered 2014-01-08 – 2014-01-11 (×6): 10 mg via ORAL
  Filled 2014-01-08 (×6): qty 1

## 2014-01-08 NOTE — H&P (Signed)
[facility] History and Physical  Chelsea Lynch WUJ:[id number] DOB: 1925/03/07 DOA: 01/08/2014  Referring physician:  [name] PCP:  [name], MD   Chief Complaint:  Nausea, vomiting, abdominal pain  HPI:  The patient is a 80 y.o. year-old female with history of hypertension, paroxysmal atrial fibrillation, AAA, hypothyroidism, dementia with sundowning, chronic pain due to DDD/arthritis and peripheral neuropathy who lives at home with her husband and ambulates with a rolling walker.  She has a 24h home health aid.  She presents with nausea, vomiting, abdominal pain.    The patient was last at their baseline health during the day yesterday.  Due to confusion from dementia and perhaps some infection-related delirium, the patient is unable to give a reliable history.  Her daughters and her husband contribute to the history.  Per husband, she has been sleepier and grumpier than usual the last few days, then last night, she lay in bed and vomited repeatedly, nonbilious, nonbloody.  She has not had a BM in several days and is constipated.  She has been complaining of 3/10 intermittent left lower quadrant pain.  Denies fevers and has not noticed increased SOB or cough.  She has chronic sinus congestion and dyspnea with mouthbreathing.  She was very weak this morning so her family called EMS and she was transported to the ER.    In the ER, VSS.  WBC 11, BUN:Cr mildly elevated.  Troponin negative.  CXR with suggestion of LUL PNA.  CT abd/pelvis demonstrated no acute abnormality to explain her nausea, vomiting.  She was found to have an enlarged AAA, now 4.9cm, stable biliary tree dilation, chronic femoral head AVN, and nonspecific left adnexal lesion 2.4cm.  She was not able to tolerate liquids well due to nausea and is being admitted for further eva  Review of Systems:  Limited due to dementia/delirium General:  Feels hot currently HEENT:  + rhinorrhea, sinus congestion CV:   Denies chest pain   PULM:  + SOB, cough.   GI:  + nausea, vomiting, constipation, no diarrhea.   GU:  "feels like knife when peeing" ENDO:  Denies polyuria, polydipsia.   MSK:  Chronic arthralgias, myalgias.   DERM:  Denies skin rash or ulcer.   NEURO: family has not noticed any new focal numbness, weakness, facial droop, however, her confusion has worsened.  She last left upper extremity weakness due to previous stroke which is chronic.    Past Medical History  Diagnosis Date  . GERD (gastroesophageal reflux disease)   . Depression   . Neuropathy   . Constipation, chronic   . Insomnia   . Chronic pain   . Hypothyroid   . Anxiety   . Frequent falls     "not much in the last few months" (11/08/2013)  . PAF (paroxysmal atrial fibrillation)     not on anticoagulation due to a fall risk, wore an Event monitor 02//23/11-03/03/11  . PAT (paroxysmal atrial tachycardia)     Supraventricular tachycardia; most recent episode associated with sepsis  . Hypertension   . AAA (abdominal aortic aneurysm) without rupture     Followed by routine Dopplers  . Spinal stenosis   . Complication of anesthesia     "it's hard for me to wakeup" (11/08/2013)  . Pneumonia     "several years ago" (11/08/2013)  . Chronic bronchitis     "hasn't had it in quite a few years" (11/08/2013)  . Shortness of breath     "can come about  at anytime" (11/08/2013)  . Sleep apnea     "doesn't wear a mask; RX'd but wouldn't get one" (11/08/2013)  . Headache(784.0)     "at least weekly; sometimes daily" (11/08/2013)  . Stroke ?1980's    denies residual on 11/08/2013  . DDD (degenerative disc disease)     "all of her spine" (11/08/2013)  . Arthritis     "joints; back; hands are getting pretty bad" (11/08/2013)  . Chronic lower back pain   . Chronic leg pain     "nerve pain" (11/08/2013)  . Dementia     "confusion comes and goes; mostly situational; does not have good Armonee Bojanowski term memory but won't admit it" (11/08/2013)    Past Surgical History  Procedure Laterality Date  . Nm myocar perf wall motion  June 2014    No ischemia or infarction, mild apical breast attenuation, EF roughly 70%  . Doppler echocardiography  03/2013    Normal EF 66 5%. Mild/moderate aortic regurgitation, mildly dilated RV with moderate pulmonary hypertension  . Abdomnal aorta  02/25/2012    mid abd. aorta  mid segment diltaton 3.93 x 4.55 cm greatest diameter.mild amt atherosclerosis without evidence of sigificant diamter reduction  . Lower arterial extremities doppler  02/19/2011    right abi 0.98,left 0.97  . Renal doppler  02/12/2011    abd aorta prox 2.6 x 3.1 cm,mid 4.1 x 3.6 cm, distal 2.3 x 3.4 cm, right renal artery 1-59% left renal artery norm. ,both renal size normal  . Renal doppler  02/11/2010    infrarenal AAA mearsuring 3.6 x 3.7 cm  . Appendectomy    . Cholecystectomy    . Eye surgery    . Lumbar disc surgery  X 3  . Tonsillectomy    . Knee arthroscopy Right ?1980's  . Vaginal hysterectomy    . Dilation and curettage of uterus      "a few" (11/08/2013)  . Cataract extraction w/ intraocular lens  implant, bilateral Bilateral 2000's  . Anterior cervical decomp/discectomy fusion     Social History:  reports that she has quit smoking. Her smoking use included Cigarettes. She has a 5 pack-year smoking history. She has never used smokeless tobacco. She reports that she drinks alcohol. She reports that she does not use illicit drugs. She is a married mother of 2, grandmother 4 with now 2 great-grandchildren. She routinely exercises, currently using the assistance of home health PT. She does use a walker. She is always accompanied by her daughter, who does say that she is not all that functional sports getting around the house and being much more than just mild activities. She does not smoke or drink alcohol.  Allergies  Allergen Reactions  . Clarithromycin Other (See Comments)    Unknown   . Codeine Other (See  Comments)    Unknown   . Penicillins Other (See Comments)    Unknown.  Rash?   Lorenda Cahill [Gatifloxacin] Other (See Comments)    Unknown     Family History  Problem Relation Age of Onset  . Stroke Mother   . Heart attack Father   . Lung cancer Sister   . Bone cancer Sister   . High blood pressure    . Heart disease       Prior to Admission medications   Medication Sig Start Date End Date Taking? Authorizing Provider  acetaminophen (TYLENOL) 500 MG tablet Take 1,000 mg by mouth every 6 (six) hours as needed for mild pain.  Yes Historical Provider, MD  aspirin EC 325 MG tablet Take 1 tablet (325 mg total) by mouth daily. 11/10/13  Yes Esperanza Sheets, MD  atorvastatin (LIPITOR) 10 MG tablet Take 1 tablet (10 mg total) by mouth daily at 6 PM. 11/10/13  Yes Esperanza Sheets, MD  Biotin 5000 MCG CAPS Take 5,000 mcg by mouth daily.   Yes Historical Provider, MD  bisacodyl (DULCOLAX) 10 MG suppository Place 10 mg rectally daily as needed for mild constipation or moderate constipation.   Yes Historical Provider, MD  bisacodyl (DULCOLAX) 5 MG EC tablet Take 5 mg by mouth daily as needed for moderate constipation.   Yes Historical Provider, MD  docusate sodium (COLACE) 100 MG capsule Take 100 mg by mouth daily with supper.   Yes Historical Provider, MD  fish oil-omega-3 fatty acids 1000 MG capsule Take 2 g by mouth every morning.    Yes Historical Provider, MD  furosemide (LASIX) 20 MG tablet Take 20 mg by mouth every morning.   Yes Historical Provider, MD  gabapentin (NEURONTIN) 400 MG capsule Take 400 mg by mouth 3 (three) times daily.   Yes Historical Provider, MD  levothyroxine (SYNTHROID, LEVOTHROID) 75 MCG tablet Take 1 tablet (75 mcg total) by mouth daily before breakfast. 04/06/13  Yes Joseph Art, DO  Melatonin 5 MG CAPS Take 4 capsules by mouth at bedtime as needed (sleep).   Yes Historical Provider, MD  metoprolol succinate (TOPROL-XL) 50 MG 24 hr tablet Take 25 mg by mouth 2 (two)  times daily. Take with or immediately following a meal. 04/06/13  Yes Joseph Art, DO  mirabegron ER (MYRBETRIQ) 50 MG TB24 Take 50 mg by mouth every evening.    Yes Historical Provider, MD  nitrofurantoin (MACRODANTIN) 100 MG capsule Take 1 capsule by mouth every evening. 04/26/13  Yes Historical Provider, MD  oxyCODONE (OXYCONTIN) 10 MG 12 hr tablet Take 10 mg by mouth every 12 (twelve) hours.   Yes Historical Provider, MD  potassium chloride (K-DUR,KLOR-CON) 10 MEQ tablet Take 10 mEq by mouth every other day.   Yes Historical Provider, MD  traZODone (DESYREL) 50 MG tablet Take 100 mg by mouth at bedtime.   Yes Historical Provider, MD  venlafaxine XR (EFFEXOR-XR) 75 MG 24 hr capsule Take 75 mg by mouth 2 (two) times daily.   Yes Historical Provider, MD  Vitamin D, Ergocalciferol, (DRISDOL) 50000 UNITS CAPS Take 50,000 Units by mouth every 7 (seven) days. wednesdays   Yes Historical Provider, MD  zaleplon (SONATA) 10 MG capsule Take 10 mg by mouth at bedtime.   Yes Historical Provider, MD   Physical Exam: Filed Vitals:   01/08/14 1045 01/08/14 1636 01/08/14 1828  BP: 146/86 126/86 128/78  Pulse: 110 84 90  Temp: 98 F (36.7 C)  98.1 F (36.7 C)  TempSrc: Oral  Oral  Resp: 20 22 20   Height:   5\' 4"  (1.626 m)  Weight:   64.1 kg (141 lb 5 oz)  SpO2: 94% 94% 95%     General:  CF, NAD but panting and tachypneic  Eyes:  PERRL, anicteric, non-injected.  ENT:  Nares very congested with swollen turbinates.  OP clear, non-erythematous without plaques or exudates.  MMM.  Neck:  Supple without TM or JVD.    Lymph:  No cervical, supraclavicular, or submandibular LAD.  Cardiovascular:  RRR, normal S1, S2, 2/6 systolic murmur LSB.  2+ pulses, warm extremities  Respiratory:  CTA except for rales at the left base  which cleared somewhat with repeat respirations, without increased WOB.  Abdomen:  NABS.  Soft, ND, TTP LLQ without rebound or guarding  Skin:  No rashes or focal  lesions.  Musculoskeletal:  Decreased bulk and tone except for LUE with increased tone.    Psychiatric:  A & O to person only.  Appropriate affect.  Was unable to answer some questions and would simply not answer at all  Neurologic:  CN 3-12 intact.  Sensation intact.  Keeps fingers in contracted position but is able to extend them with effort.  Gross motor LUE only, 4/5 strength, other extremities 5-/5.  No LE edema.    Labs on Admission:  Basic Metabolic Panel:  Recent Labs Lab 01/08/14 1120  NA 139  K 3.7  CL 98  CO2 22  GLUCOSE 131*  BUN 26*  CREATININE 1.16*  CALCIUM 8.9   Liver Function Tests:  Recent Labs Lab 01/08/14 1120  AST 18  ALT 15  ALKPHOS 74  BILITOT 0.3  PROT 7.0  ALBUMIN 3.7    Recent Labs Lab 01/08/14 1120  LIPASE 21   No results found for this basename: AMMONIA,  in the last 168 hours CBC:  Recent Labs Lab 01/08/14 1120  WBC 11.0*  NEUTROABS 10.0*  HGB 12.9  HCT 36.7  MCV 88.2  PLT 215   Cardiac Enzymes:  Recent Labs Lab 01/08/14 1120  TROPONINI <0.30    BNP (last 3 results)  Recent Labs  03/25/13 2035  PROBNP 7189.0*   CBG: No results found for this basename: GLUCAP,  in the last 168 hours  Radiological Exams on Admission: Dg Chest 2 View (if Patient Has Fever And/or Copd)  01/08/2014   CLINICAL DATA:  Shortness of breath  EXAM: CHEST  2 VIEW  COMPARISON:  Prior chest x-ray 11/08/2013; CT of the thoracic spine 12/31/2010  FINDINGS: Stable cardiac and mediastinal contours. Atherosclerotic and highly ectatic thoracic aorta. There is likely aneurysmal dilatation of the ascending and proximal transverse segments the maximal transverse diameter of approximately 4.5 cm. The ascending thoracic aortic can be measured at approximately 4.5 cm on the prior CT scan of the thoracic spine from January of 2012.  Mild patchy airspace opacity in the right lower lobe. Background changes of central airway thickening, peribronchial cuffing  and mild interstitial prominence are similar compared to prior and likely chronic. No acute osseous abnormality. Soft tissue Anchor in the right humeral head suggest prior rotator cuff repair. Epidural spinal stimulator is noted in the upper and mid thoracic spine. Stable L1 compression fracture. Surgical clips in the right upper quadrant suggest prior cholecystectomy.  IMPRESSION: 1. Subtle patchy opacity in the right lower lobe could represent bronchopneumonia in the appropriate clinical setting. Small volume aspiration or atelectasis or also possibilities. 2. Grossly stable aneurysmal dilatation of the ascending thoracic aorta is measured at approximately 4.5 cm by conventional radiography. 3. Stable L1 compression fracture.   Electronically Signed   By: Malachy Moan M.D.   On: 01/08/2014 11:15   Ct Abdomen Pelvis W Contrast  01/08/2014   CLINICAL DATA:  Left-sided abdominal pain with nausea and vomiting.  EXAM: CT ABDOMEN AND PELVIS WITH CONTRAST  TECHNIQUE: Multidetector CT imaging of the abdomen and pelvis was performed using the standard protocol following bolus administration of intravenous contrast.  CONTRAST:  50mL OMNIPAQUE IOHEXOL 300 MG/ML SOLN, 80mL OMNIPAQUE IOHEXOL 300 MG/ML SOLN  COMPARISON:  CT L SPINE W/CM dated 12/31/2010; CT ABDOMEN W/O CM dated 01/17/2010  FINDINGS: There  are progressive fibrotic changes at both lung bases. There is no confluent airspace opacity, pleural or pericardial effusion. The heart size is stable.  Again demonstrated is diffuse intrahepatic and extrahepatic biliary dilatation. The common hepatic duct measures up to 1.5 cm in diameter. Appearance is similar to the prior study. The patient is status post cholecystectomy. No pancreatic mass or pancreatic ductal dilatation is identified. The pancreas is atrophied but stable.  The liver, spleen and adrenal glands appear unremarkable. Both kidneys demonstrate cortical thinning but no hydronephrosis or suspicious cortical  finding.  The stomach and small bowel demonstrate no acute findings. There are diverticular changes of the sigmoid colon without surrounding inflammation.  Extensive aortoiliac atherosclerosis is again noted. There is a fusiform aneurysm of the abdominal aorta which has enlarged from 2011, measuring up to 4.9 cm AP on image 35. This aneurysm appears relatively stable compared with the more recent spine CT. There is no evidence of retroperitoneal hematoma or lymphadenopathy.  The uterus is surgically absent. There is a low-density 2.4 cm left adnexal lesion on image 62, not previously imaged. The right adnexa and urinary bladder appear unremarkable.  Spinal stimulator is in place extending into the lower thoracic spinal canal. Superior endplate compression deformity at L1 is stable. There are degenerative changes of both hips with probable underlying femoral head avascular necrosis bilaterally. There is no significant subchondral collapse.  IMPRESSION: 1. No acute findings or specific explanation for the patient's symptoms identified. 2. Enlarging abdominal aortic aneurysm, now measuring up to 4.9 cm AP. No evidence of retroperitoneal hemorrhage. 3. Progressive basilar pulmonary fibrosis. 4. Intra and extrahepatic biliary dilatation similar to CT from 2011. 5. Chronic L1 compression deformity and mild bilateral femoral head avascular necrosis. 6. Nonspecific low-density left adnexal lesion, not previously imaged.   Electronically Signed   By: Roxy Horseman M.D.   On: 01/08/2014 13:42    EKG: Independently reviewed. NSR, no ST-segment elevations or depressions, low voltage  Assessment/Plan Active Problems:   GASTRITIS, ACUTE   Aspiration pneumonia   PAF (paroxysmal atrial fibrillation)   Pyelonephritis   HCAP (healthcare-associated pneumonia)   Adnexal mass   Dementia with behavioral disturbance  ---  HCAP vs. Aspiration pneumonia -  Vanc, cefepime, flagyl -  S. Pneumo, legionella -  guiafenesin  prn -  F/u blood cultures  Possible UTI, UA with + nitrite, moderate LE, 7-10 WBC, many bacteria -  abx as above -  F/u urine culture  Nausea and vomiting, ddx includes pneumonia, UTI, gastritis.  Has some associated dehydration suggested by exam, leukocytosis, and elevated BUN:Cr -  Antiemetics and IVF -  Repeat BMP in AM  Sinus congestion -  Nasal saline, flonase, and afrin  LLQ pain with adnexal mass on CT vs. Constipation.  Pain more likely to be secondary to constipation, however, will try to evaluate mass more thoroughly -  Pelvic US  -  Bisacodyl, colace, senna -  Disimpaction prn  Hypothyroidism, stable, continue synthroid  PAF, stable.  Continue ASA, BB  Hx Stroke, stable.  Continue ASA, statin  AAA, enlarging but no evidence of leak, followed by vascular.  F/u with vascular  Chronic pain, stable.  Continue oxycontin, gabapentin  Dementia with behavioral disturbance, sleep disturbance -  Continue effexor, trazodone, and sleep medications -  Frequent reorientation  Diet:  regular Access:  PIV IVF:  yes Proph:  lovenox  Code Status: DNR Family Communication: patient and her two daughters Disposition Plan: Admit to telemetry  Time spent: 60 min  Renae FickleSHORT, Suhail Peloquin [facility] Pager [phone]  If 7PM-7AM, please contact night-coverage [email or web address] Password [email or web address] 01/08/2014, 7:46 PM

## 2014-01-08 NOTE — ED Notes (Signed)
Pt adds that she has felt more sob than usual. Lungs clear.

## 2014-01-08 NOTE — ED Notes (Signed)
Bed: WA10 Expected date:  Expected time:  Means of arrival:  Comments: ems 

## 2014-01-08 NOTE — Progress Notes (Signed)
Utilization Review completed.  Alano Blasco RN CM  

## 2014-01-08 NOTE — ED Notes (Signed)
Patient transported to X-ray 

## 2014-01-08 NOTE — Progress Notes (Signed)
PHARMACIST - PHYSICIAN ORDER COMMUNICATION  CONCERNING: P&T Medication Policy on Herbal Medications  DESCRIPTION:  This patient's order for:  Melatonin 20mg , Biotin  has been noted.  This product(s) is classified as an "herbal" or natural product. Due to a lack of definitive safety studies or FDA approval, nonstandard manufacturing practices, plus the potential risk of unknown drug-drug interactions while on inpatient medications, the Pharmacy and Therapeutics Committee does not permit the use of "herbal" or natural products of this type within [facility].   ACTION TAKEN: The pharmacy department is unable to verify this order at this time and your patient has been informed of this safety policy. Please reevaluate patient's clinical condition at discharge and address if the herbal or natural product(s) should be resumed at that time.

## 2014-01-08 NOTE — ED Notes (Signed)
Pt from home. Pt has been having n/v since yesterday with diarrhea starting today. EMS gave 4mg  zofran prior to arrival. Pt also complaining of chronic back pain.

## 2014-01-08 NOTE — ED Notes (Signed)
hospitalist at bedside

## 2014-01-08 NOTE — Progress Notes (Signed)
ANTIBIOTIC CONSULT NOTE - INITIAL  Pharmacy Consult for vancomycin Indication: pneumonia  Allergies  Allergen Reactions  . Clarithromycin Other (See Comments)    Unknown   . Codeine Other (See Comments)    Unknown   . Penicillins Other (See Comments)    Unknown.  Rash?   Lorenda Cahill [Gatifloxacin] Other (See Comments)    Unknown     Patient Measurements:   Adjusted Body Weight:   Vital Signs: Temp: 98 F (36.7 C) (02/02 1045) Temp src: Oral (02/02 1045) BP: 126/86 mmHg (02/02 1636) Pulse Rate: 84 (02/02 1636) Intake/Output from previous day:   Intake/Output from this shift:    Labs:  Recent Labs  01/08/14 1120  WBC 11.0*  HGB 12.9  PLT 215  CREATININE 1.16*   The CrCl is unknown because both a height and weight (above a minimum accepted value) are required for this calculation. No results found for this basename: VANCOTROUGH, VANCOPEAK, VANCORANDOM, GENTTROUGH, GENTPEAK, GENTRANDOM, TOBRATROUGH, TOBRAPEAK, TOBRARND, AMIKACINPEAK, AMIKACINTROU, AMIKACIN,  in the last 72 hours   Microbiology: No results found for this or any previous visit (from the past 720 hour(s)).  Medical History: Past Medical History  Diagnosis Date  . GERD (gastroesophageal reflux disease)   . Depression   . Neuropathy   . Constipation, chronic   . Insomnia   . Chronic pain   . Hypothyroid   . Anxiety   . Frequent falls     "not much in the last few months" (11/08/2013)  . PAF (paroxysmal atrial fibrillation)     not on anticoagulation due to a fall risk, wore an Event monitor 02//23/11-03/03/11  . PAT (paroxysmal atrial tachycardia)     Supraventricular tachycardia; most recent episode associated with sepsis  . Hypertension   . AAA (abdominal aortic aneurysm) without rupture     Followed by routine Dopplers  . Spinal stenosis   . Complication of anesthesia     "it's hard for me to wakeup" (11/08/2013)  . Pneumonia     "several years ago" (11/08/2013)  . Chronic bronchitis      "hasn't had it in quite a few years" (11/08/2013)  . Shortness of breath     "can come about at anytime" (11/08/2013)  . Sleep apnea     "doesn't wear a mask; RX'd but wouldn't get one" (11/08/2013)  . Headache(784.0)     "at least weekly; sometimes daily" (11/08/2013)  . Stroke ?1980's    denies residual on 11/08/2013  . DDD (degenerative disc disease)     "all of her spine" (11/08/2013)  . Arthritis     "joints; back; hands are getting pretty bad" (11/08/2013)  . Chronic lower back pain   . Chronic leg pain     "nerve pain" (11/08/2013)  . Dementia     "confusion comes and goes; mostly situational; does not have good short term memory but won't admit it" (11/08/2013)    Assessment: 71 YOF presents with N/V and abd pain.  CXR reveals subtle patchy opacity in RLL that could be bronchopneumonia.  Started on Cefepime and vancomycin for HCAP.  Vancomycin 1gm sent to ED but does not appear to have been given - to give on floor.   2/2 >>vancomycin  >> 2/2 >> cefepime >>   2/2 >> metronidazole >>  Tmax: afeb WBCs: 11 Renal: SCr = 1.16, est CrCl = 67ml/min (C-G) and 39.45ml/min (N)  2/2 blood: pending 2/2 urine: pending  Goal of Therapy:  Vancomycin trough level 15-20 mcg/ml  Plan:   Vancomycin 1gm IV q24h  Check steady state trough if remains on vancomycin > 4 days  Change Cefepime to 1gm IV q24h  Dannielle HuhZeigler, Alton Bouknight George 01/08/2014,5:48 PM

## 2014-01-08 NOTE — ED Notes (Addendum)
MD at bedside. 

## 2014-01-08 NOTE — ED Notes (Signed)
Patient transported to CT 

## 2014-01-08 NOTE — ED Provider Notes (Signed)
CSN: [id number]     Arrival date & time 01/08/14  1041 History   First MD Initiated Contact with Patient 01/08/14 1107     Chief Complaint  Patient presents with  . Emesis  . Diarrhea  . Shortness of Breath   (Consider location/radiation/quality/duration/timing/severity/associated sxs/prior Treatment) Patient is a 80 y.o. female presenting with vomiting, diarrhea, and shortness of breath. The history is provided by the patient.  Emesis Severity:  Mild Duration:  1 day Timing:  Constant Quality:  Stomach contents Progression:  Unchanged Chronicity:  New Relieved by:  Nothing Worsened by:  Nothing tried Associated symptoms: abdominal pain and diarrhea   Associated symptoms: no headaches   Diarrhea Associated symptoms: abdominal pain and vomiting   Associated symptoms: no fever and no headaches   Shortness of Breath Associated symptoms: abdominal pain, cough and vomiting   Associated symptoms: no chest pain, no fever, no headaches and no neck pain     Past Medical History  Diagnosis Date  . GERD (gastroesophageal reflux disease)   . Depression   . Neuropathy   . Constipation, chronic   . Insomnia   . Chronic pain   . Hypothyroid   . Anxiety   . Frequent falls     "not much in the last few months" (11/08/2013)  . PAF (paroxysmal atrial fibrillation)     not on anticoagulation due to a fall risk, wore an Event monitor 02//23/11-03/03/11  . PAT (paroxysmal atrial tachycardia)     Supraventricular tachycardia; most recent episode associated with sepsis  . Hypertension   . AAA (abdominal aortic aneurysm) without rupture     Followed by routine Dopplers  . Spinal stenosis   . Complication of anesthesia     "it's hard for me to wakeup" (11/08/2013)  . Pneumonia     "several years ago" (11/08/2013)  . Chronic bronchitis     "hasn't had it in quite a few years" (11/08/2013)  . Shortness of breath     "can come about at anytime" (11/08/2013)  . Sleep apnea     "doesn't wear a  mask; RX'd but wouldn't get one" (11/08/2013)  . Headache(784.0)     "at least weekly; sometimes daily" (11/08/2013)  . Stroke ?1980's    denies residual on 11/08/2013  . DDD (degenerative disc disease)     "all of her spine" (11/08/2013)  . Arthritis     "joints; back; hands are getting pretty bad" (11/08/2013)  . Chronic lower back pain   . Chronic leg pain     "nerve pain" (11/08/2013)  . Dementia     "confusion comes and goes; mostly situational; does not have good short term memory but won't admit it" (11/08/2013)   Past Surgical History  Procedure Laterality Date  . Nm myocar perf wall motion  June 2014    No ischemia or infarction, mild apical breast attenuation, EF roughly 70%  . Doppler echocardiography  03/2013    Normal EF 66 5%. Mild/moderate aortic regurgitation, mildly dilated RV with moderate pulmonary hypertension  . Abdomnal aorta  02/25/2012    mid abd. aorta  mid segment diltaton 3.93 x 4.55 cm greatest diameter.mild amt atherosclerosis without evidence of sigificant diamter reduction  . Lower arterial extremities doppler  02/19/2011    right abi 0.98,left 0.97  . Renal doppler  02/12/2011    abd aorta prox 2.6 x 3.1 cm,mid 4.1 x 3.6 cm, distal 2.3 x 3.4 cm, right renal artery 1-59% left renal artery norm. ,both  renal size normal  . Renal doppler  02/11/2010    infrarenal AAA mearsuring 3.6 x 3.7 cm  . Appendectomy    . Cholecystectomy    . Eye surgery    . Lumbar disc surgery  X 3  . Tonsillectomy    . Knee arthroscopy Right ?1980's  . Vaginal hysterectomy    . Dilation and curettage of uterus      "a few" (11/08/2013)  . Cataract extraction w/ intraocular lens  implant, bilateral Bilateral 2000's  . Anterior cervical decomp/discectomy fusion     Family History  Problem Relation Age of Onset  . Stroke Mother   . Heart attack Father   . Lung cancer Sister   . Bone cancer Sister    History  Substance Use Topics  . Smoking status: Former Smoker -- 0.50  packs/day for 10 years    Types: Cigarettes  . Smokeless tobacco: Never Used  . Alcohol Use: Yes     Comment: 11/08/2013 "used to drink some; never had problem w/it; haven't had a drink in years"   OB History   Grav Para Term Preterm Abortions TAB SAB Ect Mult Living                 Review of Systems  Constitutional: Negative for fever and fatigue.  HENT: Negative for congestion and drooling.   Eyes: Negative for pain.  Respiratory: Positive for cough and shortness of breath.   Cardiovascular: Negative for chest pain.  Gastrointestinal: Positive for nausea, vomiting, abdominal pain and diarrhea.  Genitourinary: Negative for dysuria and hematuria.  Musculoskeletal: Negative for back pain, gait problem and neck pain.  Skin: Negative for color change.  Neurological: Negative for dizziness and headaches.  Hematological: Negative for adenopathy.  Psychiatric/Behavioral: Negative for behavioral problems.  All other systems reviewed and are negative.    Allergies  Clarithromycin; Codeine; Penicillins; and Tequin  Home Medications   Current Outpatient Rx  Name  Route  Sig  Dispense  Refill  . acetaminophen (TYLENOL) 500 MG tablet   Oral   Take 1,000 mg by mouth every 6 (six) hours as needed for mild pain.         Marland Kitchen aspirin EC 325 MG tablet   Oral   Take 1 tablet (325 mg total) by mouth daily.   30 tablet   0   . atorvastatin (LIPITOR) 10 MG tablet   Oral   Take 1 tablet (10 mg total) by mouth daily at 6 PM.   30 tablet   1   . Biotin 5000 MCG CAPS   Oral   Take 5,000 mcg by mouth daily.         . bisacodyl (DULCOLAX) 10 MG suppository   Rectal   Place 10 mg rectally daily as needed for mild constipation or moderate constipation.         . bisacodyl (DULCOLAX) 5 MG EC tablet   Oral   Take 5 mg by mouth daily as needed for moderate constipation.         . docusate sodium (COLACE) 100 MG capsule   Oral   Take 100 mg by mouth daily with supper.          . fish oil-omega-3 fatty acids 1000 MG capsule   Oral   Take 2 g by mouth every morning.          . furosemide (LASIX) 20 MG tablet   Oral   Take 20 mg by mouth every  morning.         . gabapentin (NEURONTIN) 400 MG capsule   Oral   Take 400 mg by mouth 3 (three) times daily.         Marland Kitchen levothyroxine (SYNTHROID, LEVOTHROID) 75 MCG tablet   Oral   Take 1 tablet (75 mcg total) by mouth daily before breakfast.   30 tablet   0   . Melatonin 5 MG CAPS   Oral   Take 4 capsules by mouth at bedtime as needed (sleep).         . metoprolol succinate (TOPROL-XL) 50 MG 24 hr tablet   Oral   Take 25 mg by mouth 2 (two) times daily. Take with or immediately following a meal.         . mirabegron ER (MYRBETRIQ) 50 MG TB24   Oral   Take 50 mg by mouth every evening.          . nitrofurantoin (MACRODANTIN) 100 MG capsule   Oral   Take 1 capsule by mouth every evening.         Marland Kitchen oxyCODONE (OXYCONTIN) 10 MG 12 hr tablet   Oral   Take 10 mg by mouth every 12 (twelve) hours.         . potassium chloride (K-DUR,KLOR-CON) 10 MEQ tablet   Oral   Take 10 mEq by mouth every other day.         . traZODone (DESYREL) 50 MG tablet   Oral   Take 100 mg by mouth at bedtime.         Marland Kitchen venlafaxine XR (EFFEXOR-XR) 75 MG 24 hr capsule   Oral   Take 75 mg by mouth 2 (two) times daily.         . Vitamin D, Ergocalciferol, (DRISDOL) 50000 UNITS CAPS   Oral   Take 50,000 Units by mouth every 7 (seven) days. wednesdays         . zaleplon (SONATA) 10 MG capsule   Oral   Take 10 mg by mouth at bedtime.          BP 146/86  Pulse 110  Temp(Src) 98 F (36.7 C) (Oral)  Resp 20  SpO2 94% Physical Exam  Nursing note and vitals reviewed. Constitutional: She is oriented to person, place, and time. She appears well-developed and well-nourished.  HENT:  Head: Normocephalic.  Mouth/Throat: No oropharyngeal exudate.  Dry oral mucous membranes.  Eyes: Conjunctivae and EOM  are normal. Pupils are equal, round, and reactive to light.  Neck: Normal range of motion. Neck supple.  Cardiovascular: Regular rhythm, normal heart sounds and intact distal pulses.  Exam reveals no gallop and no friction rub.   No murmur heard. HR 110  Pulmonary/Chest: Breath sounds normal. No respiratory distress. She has no wheezes.  Mild tachypnea  Abdominal: Soft. Bowel sounds are normal. There is no tenderness. There is no rebound and no guarding.  Musculoskeletal: Normal range of motion. She exhibits no edema and no tenderness.  Neurological: She is alert and oriented to person, place, and time.  Skin: Skin is warm and dry.  Psychiatric: She has a normal mood and affect. Her behavior is normal.    ED Course  Procedures (including critical care time) Labs Review Labs Reviewed  COMPREHENSIVE METABOLIC PANEL - Abnormal; Notable for the following:    Glucose, Bld 131 (*)    BUN 26 (*)    Creatinine, Ser 1.16 (*)    GFR calc non Af Amer 41 (*)  GFR calc Af Amer 48 (*)    All other components within normal limits  CBC WITH DIFFERENTIAL - Abnormal; Notable for the following:    WBC 11.0 (*)    Neutrophils Relative % 91 (*)    Neutro Abs 10.0 (*)    Lymphocytes Relative 2 (*)    Lymphs Abs 0.2 (*)    All other components within normal limits  URINALYSIS W MICROSCOPIC + REFLEX CULTURE - Abnormal; Notable for the following:    Nitrite POSITIVE (*)    Leukocytes, UA MODERATE (*)    Bacteria, UA MANY (*)    All other components within normal limits  URINE CULTURE  CULTURE, BLOOD (ROUTINE X 2)  CULTURE, BLOOD (ROUTINE X 2)  LIPASE, BLOOD  TROPONIN I  LACTIC ACID, PLASMA   Imaging Review Dg Chest 2 View (if Patient Has Fever And/or Copd)  01/08/2014   CLINICAL DATA:  Shortness of breath  EXAM: CHEST  2 VIEW  COMPARISON:  Prior chest x-ray 11/08/2013; CT of the thoracic spine 12/31/2010  FINDINGS: Stable cardiac and mediastinal contours. Atherosclerotic and highly ectatic  thoracic aorta. There is likely aneurysmal dilatation of the ascending and proximal transverse segments the maximal transverse diameter of approximately 4.5 cm. The ascending thoracic aortic can be measured at approximately 4.5 cm on the prior CT scan of the thoracic spine from January of 2012.  Mild patchy airspace opacity in the right lower lobe. Background changes of central airway thickening, peribronchial cuffing and mild interstitial prominence are similar compared to prior and likely chronic. No acute osseous abnormality. Soft tissue Anchor in the right humeral head suggest prior rotator cuff repair. Epidural spinal stimulator is noted in the upper and mid thoracic spine. Stable L1 compression fracture. Surgical clips in the right upper quadrant suggest prior cholecystectomy.  IMPRESSION: 1. Subtle patchy opacity in the right lower lobe could represent bronchopneumonia in the appropriate clinical setting. Small volume aspiration or atelectasis or also possibilities. 2. Grossly stable aneurysmal dilatation of the ascending thoracic aorta is measured at approximately 4.5 cm by conventional radiography. 3. Stable L1 compression fracture.   Electronically Signed   By: Malachy Moan M.D.   On: 01/08/2014 11:15    EKG Interpretation    Date/Time:  Monday January 08 2014 12:08:01 EST Ventricular Rate:  97 PR Interval:    QRS Duration: 74 QT Interval:  356 QTC Calculation: 452 R Axis:   3 Text Interpretation:  sinus rhythm Low voltage, precordial leads Confirmed by Reola Buckles  MD, Day Greb ([id number]) on 01/08/2014 1:40:19 PM            MDM   1. HCAP (healthcare-associated pneumonia)   2. Pyelonephritis   3. Vomiting   4. SOB (shortness of breath)   5. Generalized weakness    11:51 AM 80 y.o. female who presents with nausea and vomiting which began yesterday evening. Patient also notes some left lower abdominal pain which began this morning but is now gone. The family notes the patient has  some mild shortness of breath at baseline which seems to be slightly worse. She also has some chronic upper back pain which is slightly worse as she has not taken her pain medication in 2 days. She is afebrile and mildly tachycardic here. Will get screening labs and imaging. Morphine for pain.  Non-spec abd pain w/ vomiting suspicious for pyelonephritis. Pt also w/ cough and mild worsening of baseline sob and CXR suspicious for RLL PNA. Will tx empirically for HCAP given pt's  recent admission in December.   The patient will be admitted to [facility] hospitalist. Any medications given in the ED during this visit are listed below:  Medications  vancomycin (VANCOCIN) IVPB 1000 mg/200 mL premix (not administered)  ceFEPIme (MAXIPIME) 1 g in dextrose 5 % 50 mL IVPB (1 g Intravenous New Bag/Given 01/08/14 1547)  0.9 %  sodium chloride infusion ( Intravenous New Bag/Given 01/08/14 1548)  sodium chloride 0.9 % bolus 500 mL (0 mLs Intravenous Stopped 01/08/14 1329)  morphine 4 MG/ML injection 4 mg (4 mg Intravenous Given 01/08/14 1158)  ondansetron (ZOFRAN) injection 4 mg (4 mg Intravenous Given 01/08/14 1157)  iohexol (OMNIPAQUE) 300 MG/ML solution 50 mL (50 mLs Oral Contrast Given 01/08/14 1229)  iohexol (OMNIPAQUE) 300 MG/ML solution 100 mL (80 mLs Intravenous Contrast Given 01/08/14 1304)        Junius ArgyleForrest S Angline Schweigert, MD 01/08/14 [phone]

## 2014-01-09 DIAGNOSIS — N12 Tubulo-interstitial nephritis, not specified as acute or chronic: Secondary | ICD-10-CM

## 2014-01-09 LAB — CBC
HCT: 31.8 % — ABNORMAL LOW (ref 36.0–46.0)
HEMOGLOBIN: 11 g/dL — AB (ref 12.0–15.0)
MCH: 30.6 pg (ref 26.0–34.0)
MCHC: 34.6 g/dL (ref 30.0–36.0)
MCV: 88.6 fL (ref 78.0–100.0)
PLATELETS: 179 10*3/uL (ref 150–400)
RBC: 3.59 MIL/uL — AB (ref 3.87–5.11)
RDW: 13.9 % (ref 11.5–15.5)
WBC: 5.2 10*3/uL (ref 4.0–10.5)

## 2014-01-09 LAB — BASIC METABOLIC PANEL
BUN: 21 mg/dL (ref 6–23)
CALCIUM: 8.2 mg/dL — AB (ref 8.4–10.5)
CO2: 23 mEq/L (ref 19–32)
Chloride: 101 mEq/L (ref 96–112)
Creatinine, Ser: 1.23 mg/dL — ABNORMAL HIGH (ref 0.50–1.10)
GFR, EST AFRICAN AMERICAN: 45 mL/min — AB (ref >90)
GFR, EST NON AFRICAN AMERICAN: 39 mL/min — AB (ref >90)
Glucose, Bld: 92 mg/dL (ref 70–99)
POTASSIUM: 3.6 meq/L — AB (ref 3.7–5.3)
SODIUM: 137 meq/L (ref 137–147)

## 2014-01-09 LAB — TSH: TSH: 2.871 u[IU]/mL (ref 0.350–4.500)

## 2014-01-09 MED ORDER — PANTOPRAZOLE SODIUM 40 MG PO TBEC
40.0000 mg | DELAYED_RELEASE_TABLET | Freq: Two times a day (BID) | ORAL | Status: DC
  Administered 2014-01-09 – 2014-01-11 (×4): 40 mg via ORAL
  Filled 2014-01-09 (×7): qty 1

## 2014-01-09 MED ORDER — PROMETHAZINE HCL 25 MG/ML IJ SOLN: 12.5000 mg | Freq: Four times a day (QID) | INTRAMUSCULAR | Status: DC | PRN

## 2014-01-09 MED ORDER — BISACODYL 10 MG RE SUPP
10.0000 mg | Freq: Every day | RECTAL | Status: DC
  Administered 2014-01-09: 10 mg via RECTAL
  Filled 2014-01-09 (×2): qty 1

## 2014-01-09 MED ORDER — BISACODYL 5 MG PO TBEC: 5.0000 mg | DELAYED_RELEASE_TABLET | Freq: Every day | ORAL | Status: DC | PRN

## 2014-01-09 MED ORDER — ONDANSETRON HCL 4 MG/2ML IJ SOLN
4.0000 mg | Freq: Four times a day (QID) | INTRAMUSCULAR | Status: DC
  Administered 2014-01-09 – 2014-01-11 (×7): 4 mg via INTRAVENOUS
  Filled 2014-01-09 (×7): qty 2

## 2014-01-09 NOTE — Progress Notes (Signed)
OT Cancellation Note  Patient Details Name: Arlington CalixMaxine J Yeakel MRN: [id number] DOB: 07-10-1925   Cancelled Treatment:    Reason Eval/Treat Not Completed: OT screened, no needs identified, will sign off (pt with dementia and has 24/7 aides to A with ADLs)  Tyleah Loh A 01/09/2014, 4:02 PM

## 2014-01-09 NOTE — Progress Notes (Signed)
[facility] PROGRESS NOTE  KADIENCE MACCHI ZOX:[id number] DOB: January 03, 1925 DOA: 01/08/2014 PCP: [name], MD  Brief Summary  The patient is a 80 y.o. year-old female with history of hypertension, paroxysmal atrial fibrillation, AAA, hypothyroidism, dementia with sundowning, chronic pain due to DDD/arthritis and peripheral neuropathy who lives at home with her husband and ambulates with a rolling walker. She has a 24h home health aid. She presents with nausea, vomiting, abdominal pain.  Possible aspiration pneumonia and GNR pyelonephritis.  Awaiting urine culture report.     Assessment/Plan  Aspiration pneumonia improving - Vanc, cefepime, flagyl day 2, narrow after UCx reported - S. Pneumo, legionella not obtained  - guiafenesin prn  - blood cultures NGTD  Pyelonephritis with GNR UTI  - abx as above  - F/u sensitivities and speciation -  BCx NGTD  Nausea and vomiting, ddx includes pneumonia, UTI, gastritis. Has some associated dehydration suggested by exam, leukocytosis, and elevated BUN:Cr  - Antiemetics and IVF   Sinus congestion improved - continue nasal saline, flonase, and afrin   LLQ pain with adnexal mass on CT vs. Constipation. Pain more likely to be secondary to constipation, however, will try to evaluate mass more thoroughly  - Pelvic US unrevealing - Bisacodyl, colace, senna  - Disimpaction prn   Hypothyroidism, stable, continue synthroid  PAF, stable. Continue ASA, BB  Hx Stroke, stable. Continue ASA, statin  AAA, enlarging but no evidence of leak, followed by vascular. F/u with vascular  Chronic pain, stable. Continue oxycontin, gabapentin  Dementia with behavioral disturbance, sleep disturbance  - Continue effexor, trazodone, and sleep medications  - Frequent reorientation   Diet: dysphagia 3  Access: PIV  IVF: yes  Proph: lovenox   Code Status: DNR  Family Communication: patient and her two daughters  Disposition Plan:  Home pending  results of urine culture  Consultants:  None  Procedures:  CXR  CT abd/pelvis  Pelvic/transvaginal US  Antibiotics: Vanc, cefepime, flagyl 2/2 >> (zosyn shortage?)  HPI/Subjective:  Feeling better today.  Still no BM.  Caretaker reports that she normally needs bisacodyl suppository to go.  Has a HA and earlier today had some nausea.  Poor appetite.    Objective: Filed Vitals:   01/08/14 1828 01/08/14 2111 01/09/14 0548 01/09/14 1423  BP: 128/78 126/68 113/66 153/84  Pulse: 90 85 75 69  Temp: 98.1 F (36.7 C) 99.4 F (37.4 C) 97 F (36.1 C) 98.2 F (36.8 C)  TempSrc: Oral Oral Oral Oral  Resp: 20 22 18 18   Height: 5\' 4"  (1.626 m)     Weight: 64.1 kg (141 lb 5 oz)     SpO2: 95% 96% 95% 96%    Intake/Output Summary (Last 24 hours) at 01/09/14 1904 Last data filed at 01/09/14 1749  Gross per 24 hour  Intake   1635 ml  Output      0 ml  Net   1635 ml   Filed Weights   01/08/14 1828  Weight: 64.1 kg (141 lb 5 oz)    Exam:   General:  CF, No acute distress  HEENT:  NCAT, MMM Cardiovascular:  RRR, nl S1, S2 2/6 systolic murmur LSB, warm extremities  Telemetry:  NSR with PVC and PAC  Respiratory:  CTAB, no increased WOB  Abdomen:   NABS, soft, NT/ND MSK:   Decreased tone and bulk except for LUE with increased tone, no LEE Neuro:  Gross motor LUE only, 4/5 strength, other extremities 5-/5.   Data Reviewed: Basic Metabolic Panel:  Recent Labs Lab 01/08/14 1120 01/09/14 0507  NA 139 137  K 3.7 3.6*  CL 98 101  CO2 22 23  GLUCOSE 131* 92  BUN 26* 21  CREATININE 1.16* 1.23*  CALCIUM 8.9 8.2*   Liver Function Tests:  Recent Labs Lab 01/08/14 1120  AST 18  ALT 15  ALKPHOS 74  BILITOT 0.3  PROT 7.0  ALBUMIN 3.7    Recent Labs Lab 01/08/14 1120  LIPASE 21   No results found for this basename: AMMONIA,  in the last 168 hours CBC:  Recent Labs Lab 01/08/14 1120 01/09/14 0507  WBC 11.0* 5.2  NEUTROABS 10.0*  --   HGB 12.9 11.0*   HCT 36.7 31.8*  MCV 88.2 88.6  PLT 215 179   Cardiac Enzymes:  Recent Labs Lab 01/08/14 1120  TROPONINI <0.30   BNP (last 3 results)  Recent Labs  03/25/13 2035  PROBNP 7189.0*   CBG: No results found for this basename: GLUCAP,  in the last 168 hours  Recent Results (from the past 240 hour(s))  URINE CULTURE     Status: None   Collection Time    01/08/14  1:29 PM      Result Value Range Status   Specimen Description URINE, CATHETERIZED   Final   Special Requests NONE   Final   Culture  Setup Time     Final   Value: 01/08/2014 16:49     Performed at [facility] Count     Final   Value: >=100,000 COLONIES/ML     Performed at [facility]   Culture     Final   Value: GRAM NEGATIVE RODS     Performed at [facility]   Report Status PENDING   Incomplete  CULTURE, BLOOD (ROUTINE X 2)     Status: None   Collection Time    01/08/14  2:55 PM      Result Value Range Status   Specimen Description BLOOD RIGHT ANTECUBITAL   Final   Special Requests BOTTLES DRAWN AEROBIC AND ANAEROBIC   Final   Culture  Setup Time     Final   Value: 01/08/2014 21:41     Performed at [facility]   Culture     Final   Value:        BLOOD CULTURE RECEIVED NO GROWTH TO DATE CULTURE WILL BE HELD FOR 5 DAYS BEFORE ISSUING A FINAL NEGATIVE REPORT     Performed at [facility]   Report Status PENDING   Incomplete  CULTURE, BLOOD (ROUTINE X 2)     Status: None   Collection Time    01/08/14  2:59 PM      Result Value Range Status   Specimen Description BLOOD BLOOD LEFT FOREARM   Final   Special Requests BOTTLES DRAWN AEROBIC ONLY   Final   Culture  Setup Time     Final   Value: 01/08/2014 21:41     Performed at [facility]   Culture     Final   Value:        BLOOD CULTURE RECEIVED NO GROWTH TO DATE CULTURE WILL BE HELD FOR 5 DAYS BEFORE ISSUING A FINAL NEGATIVE REPORT     Performed at [facility]   Report Status  PENDING   Incomplete     Studies: Dg Chest 2 View (if Patient Has Fever And/or Copd)  01/08/2014   CLINICAL DATA:  Shortness of breath  EXAM: CHEST  2 VIEW  COMPARISON:  Prior chest x-ray 11/08/2013; CT of the thoracic spine 12/31/2010  FINDINGS: Stable cardiac and mediastinal contours. Atherosclerotic and highly ectatic thoracic aorta. There is likely aneurysmal dilatation of the ascending and proximal transverse segments the maximal transverse diameter of approximately 4.5 cm. The ascending thoracic aortic can be measured at approximately 4.5 cm on the prior CT scan of the thoracic spine from January of 2012.  Mild patchy airspace opacity in the right lower lobe. Background changes of central airway thickening, peribronchial cuffing and mild interstitial prominence are similar compared to prior and likely chronic. No acute osseous abnormality. Soft tissue Anchor in the right humeral head suggest prior rotator cuff repair. Epidural spinal stimulator is noted in the upper and mid thoracic spine. Stable L1 compression fracture. Surgical clips in the right upper quadrant suggest prior cholecystectomy.  IMPRESSION: 1. Subtle patchy opacity in the right lower lobe could represent bronchopneumonia in the appropriate clinical setting. Small volume aspiration or atelectasis or also possibilities. 2. Grossly stable aneurysmal dilatation of the ascending thoracic aorta is measured at approximately 4.5 cm by conventional radiography. 3. Stable L1 compression fracture.   Electronically Signed   By: Malachy MoanHeath  McCullough M.D.   On: 01/08/2014 11:15   [place] Transvaginal Non-ob  01/08/2014   CLINICAL DATA:  History of a left adnexal mass, now with left lower quadrant discomfort; known abdominal aortic aneurysm  EXAM: TRANSABDOMINAL AND TRANSVAGINAL ULTRASOUND OF PELVIS  TECHNIQUE: Both transabdominal and transvaginal ultrasound examinations of the pelvis were performed. Transabdominal technique was performed for global imaging of  the pelvis including uterus, ovaries, adnexal regions, and pelvic cul-de-sac. It was necessary to proceed with endovaginal exam following the transabdominal exam to visualize the ovaries and adnexal structures .  COMPARISON:  None  FINDINGS: Uterus  The uterus is surgically absent  Right ovary  The right ovary could not be demonstrated.  Left ovary  The left ovary could not be demonstrated.  Other findings  Fluid-filled bowel loops were demonstrated within the pelvis. The transvaginal portion of the study was limited due to patient discomfort.  IMPRESSION: This study is unrevealing. Further evaluation with abdominal pelvic CT scanning may be the most useful next imaging step especially in light of the patient's known infrarenal abdominal aortic aneurysm.   Electronically Signed   By: David  [place]   On: 01/08/2014 21:15   [place] Pelvis Complete  01/08/2014   CLINICAL DATA:  History of a left adnexal mass, now with left lower quadrant discomfort; known abdominal aortic aneurysm  EXAM: TRANSABDOMINAL AND TRANSVAGINAL ULTRASOUND OF PELVIS  TECHNIQUE: Both transabdominal and transvaginal ultrasound examinations of the pelvis were performed. Transabdominal technique was performed for global imaging of the pelvis including uterus, ovaries, adnexal regions, and pelvic cul-de-sac. It was necessary to proceed with endovaginal exam following the transabdominal exam to visualize the ovaries and adnexal structures .  COMPARISON:  None  FINDINGS: Uterus  The uterus is surgically absent  Right ovary  The right ovary could not be demonstrated.  Left ovary  The left ovary could not be demonstrated.  Other findings  Fluid-filled bowel loops were demonstrated within the pelvis. The transvaginal portion of the study was limited due to patient discomfort.  IMPRESSION: This study is unrevealing. Further evaluation with abdominal pelvic CT scanning may be the most useful next imaging step especially in light of the patient's known  infrarenal abdominal aortic aneurysm.   Electronically Signed   By: Onalee Huaavid  [place]   On: 01/08/2014 21:15   Ct Abdomen Pelvis W Contrast  01/08/2014   CLINICAL DATA:  Left-sided abdominal pain with nausea and vomiting.  EXAM: CT ABDOMEN AND PELVIS WITH CONTRAST  TECHNIQUE: Multidetector CT imaging of the abdomen and pelvis was performed using the standard protocol following bolus administration of intravenous contrast.  CONTRAST:  50mL OMNIPAQUE IOHEXOL 300 MG/ML SOLN, 80mL OMNIPAQUE IOHEXOL 300 MG/ML SOLN  COMPARISON:  CT L SPINE W/CM dated 12/31/2010; CT ABDOMEN W/O CM dated 01/17/2010  FINDINGS: There are progressive fibrotic changes at both lung bases. There is no confluent airspace opacity, pleural or pericardial effusion. The heart size is stable.  Again demonstrated is diffuse intrahepatic and extrahepatic biliary dilatation. The common hepatic duct measures up to 1.5 cm in diameter. Appearance is similar to the prior study. The patient is status post cholecystectomy. No pancreatic mass or pancreatic ductal dilatation is identified. The pancreas is atrophied but stable.  The liver, spleen and adrenal glands appear unremarkable. Both kidneys demonstrate cortical thinning but no hydronephrosis or suspicious cortical finding.  The stomach and small bowel demonstrate no acute findings. There are diverticular changes of the sigmoid colon without surrounding inflammation.  Extensive aortoiliac atherosclerosis is again noted. There is a fusiform aneurysm of the abdominal aorta which has enlarged from 2011, measuring up to 4.9 cm AP on image 35. This aneurysm appears relatively stable compared with the more recent spine CT. There is no evidence of retroperitoneal hematoma or lymphadenopathy.  The uterus is surgically absent. There is a low-density 2.4 cm left adnexal lesion on image 62, not previously imaged. The right adnexa and urinary bladder appear unremarkable.  Spinal stimulator is in place extending into the  lower thoracic spinal canal. Superior endplate compression deformity at L1 is stable. There are degenerative changes of both hips with probable underlying femoral head avascular necrosis bilaterally. There is no significant subchondral collapse.  IMPRESSION: 1. No acute findings or specific explanation for the patient's symptoms identified. 2. Enlarging abdominal aortic aneurysm, now measuring up to 4.9 cm AP. No evidence of retroperitoneal hemorrhage. 3. Progressive basilar pulmonary fibrosis. 4. Intra and extrahepatic biliary dilatation similar to CT from 2011. 5. Chronic L1 compression deformity and mild bilateral femoral head avascular necrosis. 6. Nonspecific low-density left adnexal lesion, not previously imaged.   Electronically Signed   By: Roxy Horseman M.D.   On: 01/08/2014 13:42    Scheduled Meds: . aspirin EC  325 mg Oral Daily  . bisacodyl  10 mg Rectal Daily  . ceFEPime (MAXIPIME) IV  1 g Intravenous Q24H  . docusate sodium  200 mg Oral BID  . enoxaparin (LOVENOX) injection  40 mg Subcutaneous Q24H  . fish oil-omega-3 fatty acids  2 g Oral q morning - 10a  . fluticasone  2 spray Each Nare Daily  . gabapentin  400 mg Oral TID  . levothyroxine  75 mcg Oral QAC breakfast  . metoprolol succinate  25 mg Oral BID PC  . metronidazole  500 mg Intravenous Q8H  . mirabegron ER  50 mg Oral QPM  . ondansetron (ZOFRAN) IV  4 mg Intravenous Q6H  . OxyCODONE  10 mg Oral Q12H  . oxymetazoline  1 spray Each Nare BID  . pantoprazole  40 mg Oral BID AC  . polyethylene glycol  17 g Oral Daily  . potassium chloride  10 mEq Oral QODAY  . sodium chloride  3 mL Intravenous Q12H  . traZODone  100 mg Oral QHS  .  vancomycin  1,000 mg Intravenous Q24H  . venlafaxine XR  75 mg Oral BID  . [START ON 01/10/2014] Vitamin D (Ergocalciferol)  50,000 Units Oral Q7 days   Continuous Infusions: . sodium chloride 50 mL/hr at 01/09/14 1529    Active Problems:   GASTRITIS, ACUTE   Aspiration pneumonia   PAF  (paroxysmal atrial fibrillation)   Pyelonephritis   HCAP (healthcare-associated pneumonia)   Adnexal mass   Dementia with behavioral disturbance    Time spent: 30 min    Chelsea Lynch, [facility]  [facility] Pager [phone]. If 7PM-7AM, please contact night-coverage at [email or web address], password [facility] 01/09/2014, 7:04 PM  LOS: 1 day

## 2014-01-09 NOTE — Evaluation (Signed)
Physical Therapy Evaluation Patient Details Name: Arlington CalixMaxine J Zeiders MRN: [id number] DOB: 1925-12-01 Today's Date: 01/09/2014 Time: [id number] PT Time Calculation (min): 18 min  PT Assessment / Plan / Recommendation History of Present Illness  The patient is a 80 y.o. year-old female with history of hypertension, paroxysmal atrial fibrillation, AAA, hypothyroidism, dementia with sundowning, chronic pain due to DDD/arthritis and peripheral neuropathy who lives at home with her husband and ambulates with a rolling walker.  She has a 24h home health aid.  She presents with nausea, vomiting, abdominal pain.    Clinical Impression  Pt will benefit from PT to address deficits below; plan at this time is to return home with HHPT and continued caregivers 24hrs/7days wk    PT Assessment  Patient needs continued PT services    Follow Up Recommendations  Home health PT;Supervision/Assistance - 24 hour    Does the patient have the potential to tolerate intense rehabilitation      Barriers to Discharge        Equipment Recommendations  None recommended by PT    Recommendations for Other Services     Frequency Min 3X/week    Precautions / Restrictions Precautions Precautions: Fall   Pertinent Vitals/Pain No c/o other than HA, RN present and giving meds end of session      Mobility  Bed Mobility Overal bed mobility: Needs Assistance Bed Mobility: Supine to Sit;Sit to Supine Supine to sit: Min assist Sit to supine: Min assist General bed mobility comments: incr time, cues for technique and to assist self, scoot to EOB  Transfers Overall transfer level: Needs assistance Equipment used: Rolling walker (2 wheeled) Transfers: Sit to/from Stand Sit to Stand: Min assist General transfer comment: cues fo roverall safe technique, assist with anterior-superior wt shift and with static balance initially Ambulation/Gait Ambulation/Gait assistance: Min assist;Mod assist Ambulation Distance (Feet):  130 Feet Assistive device: Rolling walker (2 wheeled) (pt normally amb with 4 wh walker) Gait Pattern/deviations: Step-through pattern;Drifts right/left;Trunk flexed General Gait Details: pt requires assist for RW direction, maneuvering obstacles and balance    Exercises     PT Diagnosis: Difficulty walking;Hemiplegia non-dominant side  PT Problem List: Decreased activity tolerance;Decreased balance;Decreased mobility PT Treatment Interventions: DME instruction;Gait training;Functional mobility training;Therapeutic activities;Therapeutic exercise;Patient/family education;Balance training     PT Goals(Current goals can be found in the care plan section) Acute Rehab PT Goals Patient Stated Goal: home, continue therapy at home PT Goal Formulation: With patient/family Time For Goal Achievement: 01/16/14 Potential to Achieve Goals: Good  Visit Information  Last PT Received On: 01/09/14 Assistance Needed: +1 History of Present Illness: The patient is a 80 y.o. year-old female with history of hypertension, paroxysmal atrial fibrillation, AAA, hypothyroidism, dementia with sundowning, chronic pain due to DDD/arthritis and peripheral neuropathy who lives at home with her husband and ambulates with a rolling walker.  She has a 24h home health aid.  She presents with nausea, vomiting, abdominal pain.         Prior Functioning  Home Living Family/patient expects to be discharged to:: Private residence Living Arrangements: Spouse/significant other Available Help at Discharge: Family;Personal care attendant;Available 24 hours/day Type of Home: House Home Access: Stairs to enter Entergy CorporationEntrance Stairs-Number of Steps: 5 Entrance Stairs-Rails: Right;Left Home Layout: One level Home Equipment: Cane - quad;Walker - 4 wheels;Bedside commode;Shower seat Additional Comments: caregivers  24/7 for ADLs prn , cooking, supervision, laundry, med mgt, grocery shopping Prior Function Level of Independence:  Independent with assistive device(s);Needs assistance Gait / Transfers Assistance Needed:  amb with 4wh walker Communication Communication: No difficulties Dominant Hand: Right    Cognition  Cognition Arousal/Alertness: Awake/alert Behavior During Therapy: WFL for tasks assessed/performed Overall Cognitive Status: History of cognitive impairments - at baseline    Extremity/Trunk Assessment Upper Extremity Assessment Upper Extremity Assessment: LUE deficits/detail LUE Deficits / Details: residual deficits due to CVA in past Lower Extremity Assessment Lower Extremity Assessment: Generalized weakness   Balance Balance Overall balance assessment: Needs assistance Sitting-balance support: No upper extremity supported;Feet supported Sitting balance-Leahy Scale: Poor Sitting balance - Comments: LOB to left x2 static sit, no righting/balance reaction in sitting Standing balance-Leahy Scale: Poor  End of Session PT - End of Session Equipment Utilized During Treatment: Gait belt Activity Tolerance: Patient tolerated treatment well Patient left: in bed;with call bell/phone within reach;with family/visitor present;with nursing/sitter in room Nurse Communication: Mobility status  GP     [facility] 01/09/2014, 11:33 AM

## 2014-01-09 NOTE — Care Management Note (Addendum)
    Page 1 of 2   01/11/2014     11:39:32 AM   CARE MANAGEMENT NOTE 01/11/2014  Patient:  Arlington CalixBROWN,Makailee J   Account Number:  [id number]  Date Initiated:  01/09/2014  Documentation initiated by:  [facility]HABIR,Marijose Curing[place]  Subjective/Objective Assessment:   80 Y/O F ADMITTED W/PYELONEPHRITIS.     Action/Plan:   FROM HOME.ACTIVE W/[facility] HHPT/OT.   Anticipated DC Date:  01/12/2014   Anticipated DC Plan:  HOME W HOME HEALTH SERVICES      DC Planning Services  CM consult      [facility]  Res[facility] Of Svcs/PTA Provider   Choice offered to / List presented to:  C-1 Patient        HH arranged  HH-2 PT  HH-3 OT      [facility]   Status of service:  In process, will continue to follow Medicare Important Message given?   (If response is "NO", the following Medicare IM given date fields will be blank) Date Medicare IM given:   Date Additional Medicare IM given:    Discharge Disposition:    Per UR Regulation:  Reviewed for med. necessity/level of care/duration of stay  If discussed at Long Length of Stay Meetings, dates discussed:    Comments:  01/11/14 Hiromi Knodel RN,BSN NCM [phone] HHPT/OT ORDERS PLACED.TC [facility] REP DEBBIE AWARE OF HHC ORDERS,& D/C.  01/09/14 Marciano Mundt RN,BSN NCM [phone] SPOKE TO [facility] REP DEBBIE,ALREADY FOLLOWING FOR D/C.WILL NEED HHPT/OT IF MD AGREE.

## 2014-01-10 DIAGNOSIS — R1110 Vomiting, unspecified: Secondary | ICD-10-CM

## 2014-01-10 DIAGNOSIS — F0391 Unspecified dementia with behavioral disturbance: Secondary | ICD-10-CM

## 2014-01-10 DIAGNOSIS — R0602 Shortness of breath: Secondary | ICD-10-CM

## 2014-01-10 DIAGNOSIS — N12 Tubulo-interstitial nephritis, not specified as acute or chronic: Secondary | ICD-10-CM

## 2014-01-10 DIAGNOSIS — J189 Pneumonia, unspecified organism: Secondary | ICD-10-CM

## 2014-01-10 DIAGNOSIS — F03918 Unspecified dementia, unspecified severity, with other behavioral disturbance: Secondary | ICD-10-CM

## 2014-01-10 LAB — URINE CULTURE: Colony Count: >=100000

## 2014-01-10 LAB — OCCULT BLOOD X 1 CARD TO LAB, STOOL: Fecal Occult Bld: NEGATIVE

## 2014-01-10 MED ORDER — LEVOFLOXACIN 750 MG PO TABS
750.0000 mg | ORAL_TABLET | ORAL | Status: DC
  Administered 2014-01-10: 750 mg via ORAL
  Filled 2014-01-10: qty 1

## 2014-01-10 NOTE — Progress Notes (Signed)
[facility] PROGRESS NOTE  Chelsea Lynch ZOX:[id number] DOB: [birth year] DOA: 01/08/2014 PCP: [name], MD   Assessment/Plan  Aspiration pneumonia improving - Vanc, cefepime, flagyl day 2, narrow after UCx reported - S. Pneumo, legionella not obtained  - guiafenesin prn  - blood cultures NGTD - Unclear what allergy rxn is to Gatifloxacin. Family unsure as well. May consider a cautious trial of levaquin. Discussed with daughter, Chelsea Lynch, who agrees with plan.  Pyelonephritis with GNR UTI  - abx as above  - F/u sensitivities and speciation -  BCx NGTD  Nausea and vomiting,Discussed with family. Multiple close contacts with n/v. Suspect gastroenteritis.   Sinus congestion improved - continue nasal saline, flonase, and afrin   LLQ pain with adnexal mass on CT vs. Constipation. Pain more likely to be secondary to constipation, however, will try to evaluate mass more thoroughly  - Pelvic US unrevealing - Bisacodyl, colace, senna  - Disimpaction prn   Hypothyroidism, stable, continue synthroid  PAF, stable. Continue ASA, BB  Hx Stroke, stable. Continue ASA, statin  AAA, enlarging but no evidence of leak, followed by vascular. F/u with vascular  Chronic pain, stable. Continue oxycontin, gabapentin  Dementia with behavioral disturbance, sleep disturbance  - Continue effexor, trazodone, and sleep medications  - Frequent reorientation  Diet: dysphagia 3  Access: PIV  IVF: yes  Proph: lovenox   Code Status: DNR  Family Communication: patient and her two daughters  Disposition Plan:  Home pending results of urine culture  Consultants:  None  Procedures:  CXR  CT abd/pelvis  Pelvic/transvaginal US  Antibiotics: Vanc, cefepime, flagyl 2/2 >>2/4 Levaquin 2/4>>>  HPI/Subjective: No complaints. Eager to go home.  Objective: Filed Vitals:   01/09/14 2138 01/09/14 2204 01/10/14 0540 01/10/14 1012  BP: 177/83 173/83 136/69 140/66  Pulse: 66  68 70   Temp: 97.8 F (36.6 C)  98.4 F (36.9 C)   TempSrc: Oral  Oral   Resp: 18  18   Height:      Weight:      SpO2: 97%  93%     Intake/Output Summary (Last 24 hours) at 01/10/14 1311 Last data filed at 01/10/14 0420  Gross per 24 hour  Intake   1805 ml  Output      0 ml  Net   1805 ml   Filed Weights   01/08/14 1828  Weight: 64.1 kg (141 lb 5 oz)    Exam:   General:  CF, No acute distress  HEENT:  NCAT, MMM Cardiovascular:  RRR, nl S1, S2 2/6 systolic murmur LSB, warm extremities  Telemetry:  NSR with PVC and PAC  Respiratory:  CTAB, no increased WOB  Abdomen:   NABS, soft, NT/ND MSK:   Decreased tone and bulk except for LUE with increased tone, no LEE Neuro:  Gross motor LUE only, 4/5 strength, other extremities 5-/5.   Data Reviewed: Basic Metabolic Panel:  Recent Labs Lab 01/08/14 1120 01/09/14 0507  NA 139 137  K 3.7 3.6*  CL 98 101  CO2 22 23  GLUCOSE 131* 92  BUN 26* 21  CREATININE 1.16* 1.23*  CALCIUM 8.9 8.2*   Liver Function Tests:  Recent Labs Lab 01/08/14 1120  AST 18  ALT 15  ALKPHOS 74  BILITOT 0.3  PROT 7.0  ALBUMIN 3.7    Recent Labs Lab 01/08/14 1120  LIPASE 21   No results found for this basename: AMMONIA,  in the last 168 hours CBC:  Recent Labs Lab 01/08/14  1120 01/09/14 0507  WBC 11.0* 5.2  NEUTROABS 10.0*  --   HGB 12.9 11.0*  HCT 36.7 31.8*  MCV 88.2 88.6  PLT 215 179   Cardiac Enzymes:  Recent Labs Lab 02/04/2014 1120  TROPONINI <0.30   BNP (last 3 results)  Recent Labs  03/25/13 2035  PROBNP 7189.0*   CBG: No results found for this basename: GLUCAP,  in the last 168 hours  Recent Results (from the past 240 hour(s))  URINE CULTURE     Status: None   Collection Time    02-04-14  1:29 PM      Result Value Range Status   Specimen Description URINE, CATHETERIZED   Final   Special Requests NONE   Final   Culture  Setup Time     Final   Value: 02/04/14 16:49     Performed at [facility] Count     Final   Value: >=100,000 COLONIES/ML     Performed at [facility]   Culture     Final   Value: KLEBSIELLA PNEUMONIAE     Performed at [facility]   Report Status 01/10/2014 FINAL   Final   Organism ID, Bacteria KLEBSIELLA PNEUMONIAE   Final  CULTURE, BLOOD (ROUTINE X 2)     Status: None   Collection Time    02-04-2014  2:55 PM      Result Value Range Status   Specimen Description BLOOD RIGHT ANTECUBITAL   Final   Special Requests BOTTLES DRAWN AEROBIC AND ANAEROBIC   Final   Culture  Setup Time     Final   Value: 02-04-14 21:41     Performed at [facility]   Culture     Final   Value:        BLOOD CULTURE RECEIVED NO GROWTH TO DATE CULTURE WILL BE HELD FOR 5 DAYS BEFORE ISSUING A FINAL NEGATIVE REPORT     Performed at [facility]   Report Status PENDING   Incomplete  CULTURE, BLOOD (ROUTINE X 2)     Status: None   Collection Time    04-Feb-2014  2:59 PM      Result Value Range Status   Specimen Description BLOOD BLOOD LEFT FOREARM   Final   Special Requests BOTTLES DRAWN AEROBIC ONLY   Final   Culture  Setup Time     Final   Value: Feb 04, 2014 21:41     Performed at [facility]   Culture     Final   Value:        BLOOD CULTURE RECEIVED NO GROWTH TO DATE CULTURE WILL BE HELD FOR 5 DAYS BEFORE ISSUING A FINAL NEGATIVE REPORT     Performed at [facility]   Report Status PENDING   Incomplete     Studies: US Transvaginal Non-ob  02/04/14   CLINICAL DATA:  History of a left adnexal mass, now with left lower quadrant discomfort; known abdominal aortic aneurysm  EXAM: TRANSABDOMINAL AND TRANSVAGINAL ULTRASOUND OF PELVIS  TECHNIQUE: Both transabdominal and transvaginal ultrasound examinations of the pelvis were performed. Transabdominal technique was performed for global imaging of the pelvis including uterus, ovaries, adnexal regions, and pelvic cul-de-sac. It was necessary to proceed with  endovaginal exam following the transabdominal exam to visualize the ovaries and adnexal structures .  COMPARISON:  None  FINDINGS: Uterus  The uterus is surgically absent  Right ovary  The right ovary could not  be demonstrated.  Left ovary  The left ovary could not be demonstrated.  Other findings  Fluid-filled bowel loops were demonstrated within the pelvis. The transvaginal portion of the study was limited due to patient discomfort.  IMPRESSION: This study is unrevealing. Further evaluation with abdominal pelvic CT scanning may be the most useful next imaging step especially in light of the patient's known infrarenal abdominal aortic aneurysm.   Electronically Signed   By: David  [place]   On: 01/08/2014 21:15   [place] Pelvis Complete  01/08/2014   CLINICAL DATA:  History of a left adnexal mass, now with left lower quadrant discomfort; known abdominal aortic aneurysm  EXAM: TRANSABDOMINAL AND TRANSVAGINAL ULTRASOUND OF PELVIS  TECHNIQUE: Both transabdominal and transvaginal ultrasound examinations of the pelvis were performed. Transabdominal technique was performed for global imaging of the pelvis including uterus, ovaries, adnexal regions, and pelvic cul-de-sac. It was necessary to proceed with endovaginal exam following the transabdominal exam to visualize the ovaries and adnexal structures .  COMPARISON:  None  FINDINGS: Uterus  The uterus is surgically absent  Right ovary  The right ovary could not be demonstrated.  Left ovary  The left ovary could not be demonstrated.  Other findings  Fluid-filled bowel loops were demonstrated within the pelvis. The transvaginal portion of the study was limited due to patient discomfort.  IMPRESSION: This study is unrevealing. Further evaluation with abdominal pelvic CT scanning may be the most useful next imaging step especially in light of the patient's known infrarenal abdominal aortic aneurysm.   Electronically Signed   By: David  [place]   On: 01/08/2014 21:15   Ct Abdomen  Pelvis W Contrast  01/08/2014   CLINICAL DATA:  Left-sided abdominal pain with nausea and vomiting.  EXAM: CT ABDOMEN AND PELVIS WITH CONTRAST  TECHNIQUE: Multidetector CT imaging of the abdomen and pelvis was performed using the standard protocol following bolus administration of intravenous contrast.  CONTRAST:  50mL OMNIPAQUE IOHEXOL 300 MG/ML SOLN, 80mL OMNIPAQUE IOHEXOL 300 MG/ML SOLN  COMPARISON:  CT L SPINE W/CM dated 12/31/2010; CT ABDOMEN W/O CM dated 01/17/2010  FINDINGS: There are progressive fibrotic changes at both lung bases. There is no confluent airspace opacity, pleural or pericardial effusion. The heart size is stable.  Again demonstrated is diffuse intrahepatic and extrahepatic biliary dilatation. The common hepatic duct measures up to 1.5 cm in diameter. Appearance is similar to the prior study. The patient is status post cholecystectomy. No pancreatic mass or pancreatic ductal dilatation is identified. The pancreas is atrophied but stable.  The liver, spleen and adrenal glands appear unremarkable. Both kidneys demonstrate cortical thinning but no hydronephrosis or suspicious cortical finding.  The stomach and small bowel demonstrate no acute findings. There are diverticular changes of the sigmoid colon without surrounding inflammation.  Extensive aortoiliac atherosclerosis is again noted. There is a fusiform aneurysm of the abdominal aorta which has enlarged from 2011, measuring up to 4.9 cm AP on image 35. This aneurysm appears relatively stable compared with the more recent spine CT. There is no evidence of retroperitoneal hematoma or lymphadenopathy.  The uterus is surgically absent. There is a low-density 2.4 cm left adnexal lesion on image 62, not previously imaged. The right adnexa and urinary bladder appear unremarkable.  Spinal stimulator is in place extending into the lower thoracic spinal canal. Superior endplate compression deformity at L1 is stable. There are degenerative changes of  both hips with probable underlying femoral head avascular necrosis bilaterally. There is no significant subchondral collapse.  IMPRESSION: 1. No acute findings or specific explanation for the patient's symptoms identified. 2. Enlarging abdominal aortic aneurysm, now measuring up to 4.9 cm AP. No evidence of retroperitoneal hemorrhage. 3. Progressive basilar pulmonary fibrosis. 4. Intra and extrahepatic biliary dilatation similar to CT from 2011. 5. Chronic L1 compression deformity and mild bilateral femoral head avascular necrosis. 6. Nonspecific low-density left adnexal lesion, not previously imaged.   Electronically Signed   By: Roxy Horseman M.D.   On: 01/08/2014 13:42    Scheduled Meds: . aspirin EC  325 mg Oral Daily  . bisacodyl  10 mg Rectal Daily  . ceFEPime (MAXIPIME) IV  1 g Intravenous Q24H  . docusate sodium  200 mg Oral BID  . enoxaparin (LOVENOX) injection  40 mg Subcutaneous Q24H  . fish oil-omega-3 fatty acids  2 g Oral q morning - 10a  . fluticasone  2 spray Each Nare Daily  . gabapentin  400 mg Oral TID  . levothyroxine  75 mcg Oral QAC breakfast  . metoprolol succinate  25 mg Oral BID PC  . metronidazole  500 mg Intravenous Q8H  . mirabegron ER  50 mg Oral QPM  . ondansetron (ZOFRAN) IV  4 mg Intravenous Q6H  . OxyCODONE  10 mg Oral Q12H  . oxymetazoline  1 spray Each Nare BID  . pantoprazole  40 mg Oral BID AC  . polyethylene glycol  17 g Oral Daily  . potassium chloride  10 mEq Oral QODAY  . sodium chloride  3 mL Intravenous Q12H  . traZODone  100 mg Oral QHS  . vancomycin  1,000 mg Intravenous Q24H  . venlafaxine XR  75 mg Oral BID  . Vitamin D (Ergocalciferol)  50,000 Units Oral Q7 days   Continuous Infusions: . sodium chloride 50 mL/hr at 01/09/14 1529    Principal Problem:   Pyelonephritis Active Problems:   GASTRITIS, ACUTE   Aspiration pneumonia   PAF (paroxysmal atrial fibrillation)   HCAP (healthcare-associated pneumonia)   Adnexal mass   Dementia  with behavioral disturbance    Time spent: 30 min    Rodnisha Blomgren K  [facility] Pager [phone]. If 7PM-7AM, please contact night-coverage at [email or web address], password [facility] 01/10/2014, 1:11 PM  LOS: 2 days

## 2014-01-10 NOTE — Discharge Summary (Signed)
Physician Discharge Summary  Chelsea Lynch ZOX:[id number] DOB: [birth year] DOA: 01/08/2014  PCP: [name], MD  Admit date: 01/08/2014 Discharge date: 01/11/2014  Time spent: 35 minutes  Recommendations for Outpatient Follow-up:  1. Follow up with PCP in 1-2 weeks  Discharge Diagnoses:  Principal Problem:   Pyelonephritis Active Problems:   GASTRITIS, ACUTE   Aspiration pneumonia   PAF (paroxysmal atrial fibrillation)   HCAP (healthcare-associated pneumonia)   Adnexal mass   Dementia with behavioral disturbance   Discharge Condition: Improved  Diet recommendation: Regular  Filed Weights   01/08/14 1828  Weight: 64.1 kg (141 lb 5 oz)    History of present illness:  The patient is a 80 y.o. year-old female with history of hypertension, paroxysmal atrial fibrillation, AAA, hypothyroidism, dementia with sundowning, chronic pain due to DDD/arthritis and peripheral neuropathy who lives at home with her husband and ambulates with a rolling walker. She has a 24h home health aid. She presents with nausea, vomiting, abdominal pain.  The patient was last at their baseline health during the day yesterday. Due to confusion from dementia and perhaps some infection-related delirium, the patient is unable to give a reliable history. Her daughters and her husband contribute to the history. Per husband, she has been sleepier and grumpier than usual the last few days, then last night, she lay in bed and vomited repeatedly, nonbilious, nonbloody. She has not had a BM in several days and is constipated. She has been complaining of 3/10 intermittent left lower quadrant pain. Denies fevers and has not noticed increased SOB or cough. She has chronic sinus congestion and dyspnea with mouthbreathing. She was very weak this morning so her family called EMS and she was transported to the ER.  In the ER, VSS. WBC 11, BUN:Cr mildly elevated. Troponin negative. CXR with suggestion of LUL PNA. CT abd/pelvis  demonstrated no acute abnormality to explain her nausea, vomiting. She was found to have an enlarged AAA, now 4.9cm, stable biliary tree dilation, chronic femoral head AVN, and nonspecific left adnexal lesion 2.4cm. She was not able to tolerate liquids well due to nausea and is being admitted for further [facility] Course:  Aspiration pneumonia improving  - Vanc, cefepime, flagyl day 2, narrow after UCx reported  - S. Pneumo, legionella not obtained  - guiafenesin prn  - blood cultures NGTD  - Unclear what allergy rxn is to Gatifloxacin. Family unsure as well. Given a cautious trial of levaquin with no adverse reactions noted. Pt continues to improve. Discussed with daughter, Rosalita Chessman. Pyelonephritis with GNR UTI  - abx as above  - Klebsiella, multidrug sensitive, but resistant to nitrofurantoin and ampicillin - BCx NGTD  Nausea and vomiting,Discussed with family. Multiple close contacts with n/v. Suspect gastroenteritis.  Sinus congestion improved  - continue nasal saline, flonase, and afrin  LLQ pain with adnexal mass on CT vs. Constipation. Pain more likely to be secondary to constipation, however, will try to evaluate mass more thoroughly  - Pelvic US unrevealing  - Bisacodyl, colace, senna  - Disimpaction prn  Hypothyroidism, stable, continue synthroid  PAF, stable. Continue ASA, BB  Hx Stroke, stable. Continue ASA, statin  AAA, enlarging but no evidence of leak, followed by vascular. F/u with vascular  Chronic pain, stable. Continue oxycontin, gabapentin  Dementia with behavioral disturbance, sleep disturbance  - Continue effexor, trazodone, and sleep medications  - Frequent reorientation  Procedures:    Consultations:    Discharge Exam: Filed Vitals:   01/10/14 1507 01/10/14 1736  01/10/14 2112 01/11/14 0553  BP: 154/77 155/78 166/80 168/92  Pulse: 66 64 67 64  Temp: 98.1 F (36.7 C)  98 F (36.7 C) 98.4 F (36.9 C)  TempSrc: Oral  Oral Oral  Resp: 16  18 16    Height:      Weight:      SpO2: 100%  99% 99%    General: Awake, in nad Cardiovascular: regular, s1, s2 Respiratory: normal resp effort, no wheezing  Discharge Instructions       Future Appointments Provider Department Dept Phone   01/25/2014 2:30 PM Micki Riley, MD [facility] [phone]       Medication List    STOP taking these medications       nitrofurantoin 100 MG capsule  Commonly known as:  MACRODANTIN      TAKE these medications       acetaminophen 500 MG tablet  Commonly known as:  TYLENOL  Take 1,000 mg by mouth every 6 (six) hours as needed for mild pain.     aspirin EC 325 MG tablet  Take 1 tablet (325 mg total) by mouth daily.     atorvastatin 10 MG tablet  Commonly known as:  LIPITOR  Take 1 tablet (10 mg total) by mouth daily at 6 PM.     Biotin 5000 MCG Caps  Take 5,000 mcg by mouth daily.     bisacodyl 5 MG EC tablet  Commonly known as:  DULCOLAX  Take 5 mg by mouth daily as needed for moderate constipation.     bisacodyl 10 MG suppository  Commonly known as:  DULCOLAX  Place 10 mg rectally daily as needed for mild constipation or moderate constipation.     docusate sodium 100 MG capsule  Commonly known as:  COLACE  Take 100 mg by mouth daily with supper.     fish oil-omega-3 fatty acids 1000 MG capsule  Take 2 g by mouth every morning.     furosemide 20 MG tablet  Commonly known as:  LASIX  Take 20 mg by mouth every morning.     gabapentin 400 MG capsule  Commonly known as:  NEURONTIN  Take 400 mg by mouth 3 (three) times daily.     levofloxacin 750 MG tablet  Commonly known as:  LEVAQUIN  Take 1 tablet (750 mg total) by mouth every other day.     levothyroxine 75 MCG tablet  Commonly known as:  SYNTHROID, LEVOTHROID  Take 1 tablet (75 mcg total) by mouth daily before breakfast.     Melatonin 5 MG Caps  Take 4 capsules by mouth at bedtime as needed (sleep).     metoprolol succinate 50 MG 24 hr  tablet  Commonly known as:  TOPROL-XL  Take 25 mg by mouth 2 (two) times daily. Take with or immediately following a meal.     MYRBETRIQ 50 MG Tb24 tablet  Generic drug:  mirabegron ER  Take 50 mg by mouth every evening.     oxyCODONE 10 MG 12 hr tablet  Commonly known as:  OXYCONTIN  Take 10 mg by mouth every 12 (twelve) hours.     potassium chloride 10 MEQ tablet  Commonly known as:  K-DUR,KLOR-CON  Take 10 mEq by mouth every other day.     traZODone 50 MG tablet  Commonly known as:  DESYREL  Take 100 mg by mouth at bedtime.     venlafaxine XR 75 MG 24 hr capsule  Commonly known as:  EFFEXOR-XR  Take 75 mg by mouth 2 (two) times daily.     Vitamin D (Ergocalciferol) 50000 UNITS Caps capsule  Commonly known as:  DRISDOL  Take 50,000 Units by mouth every 7 (seven) days. wednesdays     zaleplon 10 MG capsule  Commonly known as:  SONATA  Take 10 mg by mouth at bedtime.       Allergies  Allergen Reactions  . Clarithromycin Other (See Comments)    Unknown   . Codeine Other (See Comments)    Unknown   . Penicillins Other (See Comments)    Unknown.  Rash?   Lorenda Cahill [Gatifloxacin] Other (See Comments)    Unknown reaction.  Tolerates Levaquin.    The results of significant diagnostics from this hospitalization (including imaging, microbiology, ancillary and laboratory) are listed below for reference.    Significant Diagnostic Studies: Dg Chest 2 View (if Patient Has Fever And/or Copd)  01/08/2014   CLINICAL DATA:  Shortness of breath  EXAM: CHEST  2 VIEW  COMPARISON:  Prior chest x-ray 11/08/2013; CT of the thoracic spine 12/31/2010  FINDINGS: Stable cardiac and mediastinal contours. Atherosclerotic and highly ectatic thoracic aorta. There is likely aneurysmal dilatation of the ascending and proximal transverse segments the maximal transverse diameter of approximately 4.5 cm. The ascending thoracic aortic can be measured at approximately 4.5 cm on the prior CT scan of the  thoracic spine from January of 2012.  Mild patchy airspace opacity in the right lower lobe. Background changes of central airway thickening, peribronchial cuffing and mild interstitial prominence are similar compared to prior and likely chronic. No acute osseous abnormality. Soft tissue Anchor in the right humeral head suggest prior rotator cuff repair. Epidural spinal stimulator is noted in the upper and mid thoracic spine. Stable L1 compression fracture. Surgical clips in the right upper quadrant suggest prior cholecystectomy.  IMPRESSION: 1. Subtle patchy opacity in the right lower lobe could represent bronchopneumonia in the appropriate clinical setting. Small volume aspiration or atelectasis or also possibilities. 2. Grossly stable aneurysmal dilatation of the ascending thoracic aorta is measured at approximately 4.5 cm by conventional radiography. 3. Stable L1 compression fracture.   Electronically Signed   By: Malachy Moan M.D.   On: 01/08/2014 11:15   US Transvaginal Non-ob  01/08/2014   CLINICAL DATA:  History of a left adnexal mass, now with left lower quadrant discomfort; known abdominal aortic aneurysm  EXAM: TRANSABDOMINAL AND TRANSVAGINAL ULTRASOUND OF PELVIS  TECHNIQUE: Both transabdominal and transvaginal ultrasound examinations of the pelvis were performed. Transabdominal technique was performed for global imaging of the pelvis including uterus, ovaries, adnexal regions, and pelvic cul-de-sac. It was necessary to proceed with endovaginal exam following the transabdominal exam to visualize the ovaries and adnexal structures .  COMPARISON:  None  FINDINGS: Uterus  The uterus is surgically absent  Right ovary  The right ovary could not be demonstrated.  Left ovary  The left ovary could not be demonstrated.  Other findings  Fluid-filled bowel loops were demonstrated within the pelvis. The transvaginal portion of the study was limited due to patient discomfort.  IMPRESSION: This study is  unrevealing. Further evaluation with abdominal pelvic CT scanning may be the most useful next imaging step especially in light of the patient's known infrarenal abdominal aortic aneurysm.   Electronically Signed   By: David  [place]   On: 01/08/2014 21:15   US Pelvis Complete  01/08/2014   CLINICAL DATA:  History of a left adnexal mass, now with  left lower quadrant discomfort; known abdominal aortic aneurysm  EXAM: TRANSABDOMINAL AND TRANSVAGINAL ULTRASOUND OF PELVIS  TECHNIQUE: Both transabdominal and transvaginal ultrasound examinations of the pelvis were performed. Transabdominal technique was performed for global imaging of the pelvis including uterus, ovaries, adnexal regions, and pelvic cul-de-sac. It was necessary to proceed with endovaginal exam following the transabdominal exam to visualize the ovaries and adnexal structures .  COMPARISON:  None  FINDINGS: Uterus  The uterus is surgically absent  Right ovary  The right ovary could not be demonstrated.  Left ovary  The left ovary could not be demonstrated.  Other findings  Fluid-filled bowel loops were demonstrated within the pelvis. The transvaginal portion of the study was limited due to patient discomfort.  IMPRESSION: This study is unrevealing. Further evaluation with abdominal pelvic CT scanning may be the most useful next imaging step especially in light of the patient's known infrarenal abdominal aortic aneurysm.   Electronically Signed   By: David  [place]   On: 01/08/2014 21:15   Ct Abdomen Pelvis W Contrast  01/08/2014   CLINICAL DATA:  Left-sided abdominal pain with nausea and vomiting.  EXAM: CT ABDOMEN AND PELVIS WITH CONTRAST  TECHNIQUE: Multidetector CT imaging of the abdomen and pelvis was performed using the standard protocol following bolus administration of intravenous contrast.  CONTRAST:  50mL OMNIPAQUE IOHEXOL 300 MG/ML SOLN, 80mL OMNIPAQUE IOHEXOL 300 MG/ML SOLN  COMPARISON:  CT L SPINE W/CM dated 12/31/2010; CT ABDOMEN W/O CM dated  01/17/2010  FINDINGS: There are progressive fibrotic changes at both lung bases. There is no confluent airspace opacity, pleural or pericardial effusion. The heart size is stable.  Again demonstrated is diffuse intrahepatic and extrahepatic biliary dilatation. The common hepatic duct measures up to 1.5 cm in diameter. Appearance is similar to the prior study. The patient is status post cholecystectomy. No pancreatic mass or pancreatic ductal dilatation is identified. The pancreas is atrophied but stable.  The liver, spleen and adrenal glands appear unremarkable. Both kidneys demonstrate cortical thinning but no hydronephrosis or suspicious cortical finding.  The stomach and small bowel demonstrate no acute findings. There are diverticular changes of the sigmoid colon without surrounding inflammation.  Extensive aortoiliac atherosclerosis is again noted. There is a fusiform aneurysm of the abdominal aorta which has enlarged from 2011, measuring up to 4.9 cm AP on image 35. This aneurysm appears relatively stable compared with the more recent spine CT. There is no evidence of retroperitoneal hematoma or lymphadenopathy.  The uterus is surgically absent. There is a low-density 2.4 cm left adnexal lesion on image 62, not previously imaged. The right adnexa and urinary bladder appear unremarkable.  Spinal stimulator is in place extending into the lower thoracic spinal canal. Superior endplate compression deformity at L1 is stable. There are degenerative changes of both hips with probable underlying femoral head avascular necrosis bilaterally. There is no significant subchondral collapse.  IMPRESSION: 1. No acute findings or specific explanation for the patient's symptoms identified. 2. Enlarging abdominal aortic aneurysm, now measuring up to 4.9 cm AP. No evidence of retroperitoneal hemorrhage. 3. Progressive basilar pulmonary fibrosis. 4. Intra and extrahepatic biliary dilatation similar to CT from 2011. 5. Chronic L1  compression deformity and mild bilateral femoral head avascular necrosis. 6. Nonspecific low-density left adnexal lesion, not previously imaged.   Electronically Signed   By: Roxy Horseman M.D.   On: 01/08/2014 13:42    Microbiology: Recent Results (from the past 240 hour(s))  URINE CULTURE     Status: None  Collection Time    01/08/14  1:29 PM      Result Value Range Status   Specimen Description URINE, CATHETERIZED   Final   Special Requests NONE   Final   Culture  Setup Time     Final   Value: 01/08/2014 16:49     Performed at [facility]   Colony Count     Final   Value: >=100,000 COLONIES/ML     Performed at [facility]   Culture     Final   Value: KLEBSIELLA PNEUMONIAE     Performed at [facility]   Report Status 01/10/2014 FINAL   Final   Organism ID, Bacteria KLEBSIELLA PNEUMONIAE   Final  CULTURE, BLOOD (ROUTINE X 2)     Status: None   Collection Time    01/08/14  2:55 PM      Result Value Range Status   Specimen Description BLOOD RIGHT ANTECUBITAL   Final   Special Requests BOTTLES DRAWN AEROBIC AND ANAEROBIC 3ML   Final   Culture  Setup Time     Final   Value: 01/08/2014 21:41     Performed at [facility]   Culture     Final   Value:        BLOOD CULTURE RECEIVED NO GROWTH TO DATE CULTURE WILL BE HELD FOR 5 DAYS BEFORE ISSUING A FINAL NEGATIVE REPORT     Performed at [facility]   Report Status PENDING   Incomplete  CULTURE, BLOOD (ROUTINE X 2)     Status: None   Collection Time    01/08/14  2:59 PM      Result Value Range Status   Specimen Description BLOOD BLOOD LEFT FOREARM   Final   Special Requests BOTTLES DRAWN AEROBIC ONLY 3ML   Final   Culture  Setup Time     Final   Value: 01/08/2014 21:41     Performed at [facility]   Culture     Final   Value:        BLOOD CULTURE RECEIVED NO GROWTH TO DATE CULTURE WILL BE HELD FOR 5 DAYS BEFORE ISSUING A FINAL NEGATIVE REPORT     Performed at [facility]   Report Status PENDING   Incomplete     Labs: Basic Metabolic Panel:  Recent Labs Lab 01/08/14 1120 01/09/14 0507  NA 139 137  K 3.7 3.6*  CL 98 101  CO2 22 23  GLUCOSE 131* 92  BUN 26* 21  CREATININE 1.16* 1.23*  CALCIUM 8.9 8.2*   Liver Function Tests:  Recent Labs Lab 01/08/14 1120  AST 18  ALT 15  ALKPHOS 74  BILITOT 0.3  PROT 7.0  ALBUMIN 3.7    Recent Labs Lab 01/08/14 1120  LIPASE 21   No results found for this basename: AMMONIA,  in the last 168 hours CBC:  Recent Labs Lab 01/08/14 1120 01/09/14 0507 01/11/14 0548  WBC 11.0* 5.2 5.1  NEUTROABS 10.0*  --  2.8  HGB 12.9 11.0* 11.3*  HCT 36.7 31.8* 33.0*  MCV 88.2 88.6 88.5  PLT 215 179 177   Cardiac Enzymes:  Recent Labs Lab 01/08/14 1120  TROPONINI <0.30   BNP: BNP (last 3 results)  Recent Labs  03/25/13 2035  PROBNP 7189.0*   CBG: No results found for this basename: GLUCAP,  in the last 168 hours   Signed:  Yareni Creps K  [facility] 01/11/2014, 11:07 AM

## 2014-01-11 DIAGNOSIS — R1310 Dysphagia, unspecified: Secondary | ICD-10-CM

## 2014-01-11 DIAGNOSIS — R5381 Other malaise: Secondary | ICD-10-CM

## 2014-01-11 DIAGNOSIS — R5383 Other fatigue: Secondary | ICD-10-CM

## 2014-01-11 DIAGNOSIS — N390 Urinary tract infection, site not specified: Secondary | ICD-10-CM

## 2014-01-11 LAB — CBC WITH DIFFERENTIAL/PLATELET
BASOS PCT: 1 % (ref 0–1)
Basophils Absolute: 0.1 10*3/uL (ref 0.0–0.1)
Eosinophils Absolute: 0.3 10*3/uL (ref 0.0–0.7)
Eosinophils Relative: 5 % (ref 0–5)
HEMATOCRIT: 33 % — AB (ref 36.0–46.0)
Hemoglobin: 11.3 g/dL — ABNORMAL LOW (ref 12.0–15.0)
LYMPHS PCT: 22 % (ref 12–46)
Lymphs Abs: 1.1 10*3/uL (ref 0.7–4.0)
MCH: 30.3 pg (ref 26.0–34.0)
MCHC: 34.2 g/dL (ref 30.0–36.0)
MCV: 88.5 fL (ref 78.0–100.0)
MONO ABS: 0.9 10*3/uL (ref 0.1–1.0)
Monocytes Relative: 17 % — ABNORMAL HIGH (ref 3–12)
NEUTROS ABS: 2.8 10*3/uL (ref 1.7–7.7)
Neutrophils Relative %: 55 % (ref 43–77)
Platelets: 177 10*3/uL (ref 150–400)
RBC: 3.73 MIL/uL — ABNORMAL LOW (ref 3.87–5.11)
RDW: 13.5 % (ref 11.5–15.5)
WBC: 5.1 10*3/uL (ref 4.0–10.5)

## 2014-01-11 MED ORDER — LEVOFLOXACIN 750 MG PO TABS
750.0000 mg | ORAL_TABLET | ORAL | Status: DC
Start: 2014-01-11 — End: 2014-04-19

## 2014-01-11 NOTE — Progress Notes (Signed)
Physical Therapy Treatment Patient Details Name: Chelsea Lynch MRN: [id number] DOB: 1925-05-25 Today's Date: 01/11/2014 Time: 1050-1120 PT Time Calculation (min): 30 min  PT Assessment / Plan / Recommendation  History of Present Illness The patient is a 80 y.o. year-old female with history of hypertension, paroxysmal atrial fibrillation, AAA, hypothyroidism, dementia with sundowning, chronic pain due to DDD/arthritis and peripheral neuropathy who lives at home with her husband and ambulates with a rolling walker.  She has a 24h home health aid.  She presents with nausea, vomiting, abdominal pain.     PT Comments   Pt in recliner with personal aide "beverely" in room.  [facility]  Pt incont urine.  Assisted with hygiene. Amb in hallway.   Follow Up Recommendations  Home health PT;Supervision/Assistance - 24 hour     Does the patient have the potential to tolerate intense rehabilitation     Barriers to Discharge        Equipment Recommendations       Recommendations for Other Services    Frequency Min 3X/week   Progress towards PT Goals Progress towards PT goals: Progressing toward goals  Plan      Precautions / Restrictions Precautions Precautions: Fall Restrictions Weight Bearing Restrictions: No   Pertinent Vitals/Pain     Mobility  Bed Mobility General bed mobility comments: Pt OOB in recliner Transfers Overall transfer level: Needs assistance Equipment used: Rolling walker (2 wheeled) Transfers: Sit to/from Stand Sit to Stand: Min assist General transfer comment: cues fo roverall safe technique, assist with anterior-superior wt shift and with static balance initially Ambulation/Gait Ambulation/Gait assistance: Mod assist;Min assist Ambulation Distance (Feet): 142 Feet Assistive device: Rolling walker (2 wheeled) Gait Pattern/deviations: Step-through pattern;Drifts right/left;Trunk flexed Gait velocity: decreased General Gait Details: 50%  VC's on safety negociating around obsticles.  Unsteady gait.      PT Goals (current goals can now be found in the care plan section)    Visit Information  Last PT Received On: 01/11/14 Assistance Needed: +1 History of Present Illness: The patient is a 80 y.o. year-old female with history of hypertension, paroxysmal atrial fibrillation, AAA, hypothyroidism, dementia with sundowning, chronic pain due to DDD/arthritis and peripheral neuropathy who lives at home with her husband and ambulates with a rolling walker.  She has a 24h home health aid.  She presents with nausea, vomiting, abdominal pain.      Subjective Data      Cognition       Balance     End of Session PT - End of Session Equipment Utilized During Treatment: Gait belt Activity Tolerance: Patient tolerated treatment well Patient left: in chair;with call bell/phone within reach;with family/visitor present   Felecia ShellingLori Morrill Bomkamp  PTA [facility]  Acute  Rehab Pager      [phone]

## 2014-01-14 LAB — CULTURE, BLOOD (ROUTINE X 2)
CULTURE: NO GROWTH
Culture: NO GROWTH

## 2014-01-25 ENCOUNTER — Ambulatory Visit: Payer: Medicare Other | Admitting: Neurology

## 2014-02-21 ENCOUNTER — Ambulatory Visit: Payer: Medicare Other | Admitting: Neurology

## 2014-04-19 ENCOUNTER — Ambulatory Visit: Payer: Medicare Other | Admitting: Cardiology

## 2014-04-19 ENCOUNTER — Encounter (HOSPITAL_COMMUNITY): Payer: Self-pay | Admitting: Emergency Medicine

## 2014-04-19 ENCOUNTER — Emergency Department (HOSPITAL_COMMUNITY): Payer: Medicare Other

## 2014-04-19 ENCOUNTER — Observation Stay (HOSPITAL_COMMUNITY)
Admission: EM | Admit: 2014-04-19 | Discharge: 2014-04-21 | Disposition: A | Discharge status: Home / Self Care | Payer: Medicare Other | Attending: Internal Medicine | Admitting: Internal Medicine

## 2014-04-19 DIAGNOSIS — I714 Abdominal aortic aneurysm, without rupture, unspecified: Secondary | ICD-10-CM | POA: Insufficient documentation

## 2014-04-19 DIAGNOSIS — Z8673 Personal history of transient ischemic attack (TIA), and cerebral infarction without residual deficits: Secondary | ICD-10-CM | POA: Insufficient documentation

## 2014-04-19 DIAGNOSIS — E039 Hypothyroidism, unspecified: Secondary | ICD-10-CM | POA: Insufficient documentation

## 2014-04-19 DIAGNOSIS — R4182 Altered mental status, unspecified: Secondary | ICD-10-CM

## 2014-04-19 DIAGNOSIS — N179 Acute kidney failure, unspecified: Secondary | ICD-10-CM | POA: Insufficient documentation

## 2014-04-19 DIAGNOSIS — K2900 Acute gastritis without bleeding: Secondary | ICD-10-CM

## 2014-04-19 DIAGNOSIS — I4891 Unspecified atrial fibrillation: Secondary | ICD-10-CM | POA: Insufficient documentation

## 2014-04-19 DIAGNOSIS — F1193 Opioid use, unspecified with withdrawal: Secondary | ICD-10-CM

## 2014-04-19 DIAGNOSIS — M48061 Spinal stenosis, lumbar region without neurogenic claudication: Secondary | ICD-10-CM | POA: Insufficient documentation

## 2014-04-19 DIAGNOSIS — I471 Supraventricular tachycardia: Secondary | ICD-10-CM

## 2014-04-19 DIAGNOSIS — R079 Chest pain, unspecified: Secondary | ICD-10-CM

## 2014-04-19 DIAGNOSIS — M549 Dorsalgia, unspecified: Secondary | ICD-10-CM

## 2014-04-19 DIAGNOSIS — R7881 Bacteremia: Secondary | ICD-10-CM

## 2014-04-19 DIAGNOSIS — F411 Generalized anxiety disorder: Secondary | ICD-10-CM | POA: Insufficient documentation

## 2014-04-19 DIAGNOSIS — R1310 Dysphagia, unspecified: Secondary | ICD-10-CM

## 2014-04-19 DIAGNOSIS — E785 Hyperlipidemia, unspecified: Secondary | ICD-10-CM | POA: Insufficient documentation

## 2014-04-19 DIAGNOSIS — N12 Tubulo-interstitial nephritis, not specified as acute or chronic: Secondary | ICD-10-CM

## 2014-04-19 DIAGNOSIS — F0391 Unspecified dementia with behavioral disturbance: Secondary | ICD-10-CM | POA: Insufficient documentation

## 2014-04-19 DIAGNOSIS — R9389 Abnormal findings on diagnostic imaging of other specified body structures: Secondary | ICD-10-CM

## 2014-04-19 DIAGNOSIS — Z87891 Personal history of nicotine dependence: Secondary | ICD-10-CM | POA: Insufficient documentation

## 2014-04-19 DIAGNOSIS — K5900 Constipation, unspecified: Secondary | ICD-10-CM | POA: Insufficient documentation

## 2014-04-19 DIAGNOSIS — K5901 Slow transit constipation: Secondary | ICD-10-CM

## 2014-04-19 DIAGNOSIS — G4700 Insomnia, unspecified: Secondary | ICD-10-CM | POA: Insufficient documentation

## 2014-04-19 DIAGNOSIS — Z7982 Long term (current) use of aspirin: Secondary | ICD-10-CM | POA: Insufficient documentation

## 2014-04-19 DIAGNOSIS — E860 Dehydration: Secondary | ICD-10-CM

## 2014-04-19 DIAGNOSIS — K219 Gastro-esophageal reflux disease without esophagitis: Secondary | ICD-10-CM | POA: Insufficient documentation

## 2014-04-19 DIAGNOSIS — K5730 Diverticulosis of large intestine without perforation or abscess without bleeding: Secondary | ICD-10-CM

## 2014-04-19 DIAGNOSIS — IMO0002 Reserved for concepts with insufficient information to code with codable children: Secondary | ICD-10-CM | POA: Insufficient documentation

## 2014-04-19 DIAGNOSIS — N289 Disorder of kidney and ureter, unspecified: Secondary | ICD-10-CM

## 2014-04-19 DIAGNOSIS — M25559 Pain in unspecified hip: Secondary | ICD-10-CM | POA: Insufficient documentation

## 2014-04-19 DIAGNOSIS — R778 Other specified abnormalities of plasma proteins: Secondary | ICD-10-CM

## 2014-04-19 DIAGNOSIS — R5383 Other fatigue: Secondary | ICD-10-CM

## 2014-04-19 DIAGNOSIS — R531 Weakness: Secondary | ICD-10-CM

## 2014-04-19 DIAGNOSIS — R932 Abnormal findings on diagnostic imaging of liver and biliary tract: Secondary | ICD-10-CM

## 2014-04-19 DIAGNOSIS — J690 Pneumonitis due to inhalation of food and vomit: Secondary | ICD-10-CM

## 2014-04-19 DIAGNOSIS — D649 Anemia, unspecified: Secondary | ICD-10-CM | POA: Insufficient documentation

## 2014-04-19 DIAGNOSIS — N9489 Other specified conditions associated with female genital organs and menstrual cycle: Secondary | ICD-10-CM

## 2014-04-19 DIAGNOSIS — F1123 Opioid dependence with withdrawal: Secondary | ICD-10-CM

## 2014-04-19 DIAGNOSIS — I480 Paroxysmal atrial fibrillation: Secondary | ICD-10-CM

## 2014-04-19 DIAGNOSIS — G459 Transient cerebral ischemic attack, unspecified: Secondary | ICD-10-CM | POA: Diagnosis present

## 2014-04-19 DIAGNOSIS — Z9181 History of falling: Secondary | ICD-10-CM | POA: Insufficient documentation

## 2014-04-19 DIAGNOSIS — I639 Cerebral infarction, unspecified: Secondary | ICD-10-CM | POA: Diagnosis present

## 2014-04-19 DIAGNOSIS — G589 Mononeuropathy, unspecified: Secondary | ICD-10-CM | POA: Insufficient documentation

## 2014-04-19 DIAGNOSIS — N189 Chronic kidney disease, unspecified: Secondary | ICD-10-CM

## 2014-04-19 DIAGNOSIS — I10 Essential (primary) hypertension: Secondary | ICD-10-CM | POA: Insufficient documentation

## 2014-04-19 DIAGNOSIS — I517 Cardiomegaly: Secondary | ICD-10-CM | POA: Insufficient documentation

## 2014-04-19 DIAGNOSIS — J189 Pneumonia, unspecified organism: Secondary | ICD-10-CM

## 2014-04-19 DIAGNOSIS — I359 Nonrheumatic aortic valve disorder, unspecified: Secondary | ICD-10-CM | POA: Insufficient documentation

## 2014-04-19 DIAGNOSIS — R5381 Other malaise: Secondary | ICD-10-CM | POA: Insufficient documentation

## 2014-04-19 DIAGNOSIS — R6521 Severe sepsis with septic shock: Secondary | ICD-10-CM

## 2014-04-19 DIAGNOSIS — M1610 Unilateral primary osteoarthritis, unspecified hip: Secondary | ICD-10-CM | POA: Insufficient documentation

## 2014-04-19 DIAGNOSIS — F03918 Unspecified dementia, unspecified severity, with other behavioral disturbance: Secondary | ICD-10-CM

## 2014-04-19 DIAGNOSIS — G9340 Encephalopathy, unspecified: Principal | ICD-10-CM | POA: Insufficient documentation

## 2014-04-19 DIAGNOSIS — A419 Sepsis, unspecified organism: Secondary | ICD-10-CM

## 2014-04-19 DIAGNOSIS — Z792 Long term (current) use of antibiotics: Secondary | ICD-10-CM | POA: Insufficient documentation

## 2014-04-19 DIAGNOSIS — I2789 Other specified pulmonary heart diseases: Secondary | ICD-10-CM | POA: Insufficient documentation

## 2014-04-19 DIAGNOSIS — G8929 Other chronic pain: Secondary | ICD-10-CM | POA: Insufficient documentation

## 2014-04-19 DIAGNOSIS — R7989 Other specified abnormal findings of blood chemistry: Secondary | ICD-10-CM

## 2014-04-19 DIAGNOSIS — M169 Osteoarthritis of hip, unspecified: Secondary | ICD-10-CM | POA: Insufficient documentation

## 2014-04-19 LAB — COMPREHENSIVE METABOLIC PANEL
ALBUMIN: 4.1 g/dL (ref 3.5–5.2)
ALK PHOS: 93 U/L (ref 39–117)
ALT: 15 U/L (ref 0–35)
AST: 19 U/L (ref 0–37)
BILIRUBIN TOTAL: 0.3 mg/dL (ref 0.3–1.2)
BUN: 39 mg/dL — ABNORMAL HIGH (ref 6–23)
CHLORIDE: 102 meq/L (ref 96–112)
CO2: 27 mEq/L (ref 19–32)
CREATININE: 1.87 mg/dL — AB (ref 0.50–1.10)
Calcium: 9.8 mg/dL (ref 8.4–10.5)
GFR calc Af Amer: 27 mL/min — ABNORMAL LOW (ref >90)
GFR calc non Af Amer: 23 mL/min — ABNORMAL LOW (ref >90)
Glucose, Bld: 96 mg/dL (ref 70–99)
POTASSIUM: 4.3 meq/L (ref 3.7–5.3)
Sodium: 141 mEq/L (ref 137–147)
Total Protein: 7.8 g/dL (ref 6.0–8.3)

## 2014-04-19 LAB — URINALYSIS, ROUTINE W REFLEX MICROSCOPIC
Bilirubin Urine: NEGATIVE
Glucose, UA: NEGATIVE mg/dL
HGB URINE DIPSTICK: NEGATIVE
Ketones, ur: NEGATIVE mg/dL
Nitrite: NEGATIVE
PH: 6.5 (ref 5.0–8.0)
PROTEIN: NEGATIVE mg/dL
Specific Gravity, Urine: 1.016 (ref 1.005–1.030)
Urobilinogen, UA: 0.2 mg/dL (ref 0.0–1.0)

## 2014-04-19 LAB — CBC
HCT: 40.8 % (ref 36.0–46.0)
HCT: 38.8 % (ref 36.0–46.0)
Hemoglobin: 13.7 g/dL (ref 12.0–15.0)
Hemoglobin: 12.9 g/dL (ref 12.0–15.0)
MCH: 30.7 pg (ref 26.0–34.0)
MCH: 30.3 pg (ref 26.0–34.0)
MCHC: 33.6 g/dL (ref 30.0–36.0)
MCHC: 33.2 g/dL (ref 30.0–36.0)
MCV: 91.5 fL (ref 78.0–100.0)
MCV: 91.1 fL (ref 78.0–100.0)
PLATELETS: 254 10*3/uL (ref 150–400)
Platelets: 203 10*3/uL (ref 150–400)
RBC: 4.46 MIL/uL (ref 3.87–5.11)
RBC: 4.26 MIL/uL (ref 3.87–5.11)
RDW: 14 % (ref 11.5–15.5)
RDW: 14 % (ref 11.5–15.5)
WBC: 8 10*3/uL (ref 4.0–10.5)
WBC: 8 10*3/uL (ref 4.0–10.5)

## 2014-04-19 LAB — URINE MICROSCOPIC-ADD ON

## 2014-04-19 LAB — CREATININE, SERUM
CREATININE: 1.55 mg/dL — AB (ref 0.50–1.10)
GFR calc Af Amer: 34 mL/min — ABNORMAL LOW (ref >90)
GFR calc non Af Amer: 29 mL/min — ABNORMAL LOW (ref >90)

## 2014-04-19 LAB — TROPONIN I

## 2014-04-19 MED ORDER — SODIUM CHLORIDE 0.9 % IV SOLN
INTRAVENOUS | Status: DC
  Administered 2014-04-19: 15:00:00 via INTRAVENOUS

## 2014-04-19 MED ORDER — BISACODYL 10 MG RE SUPP: 10.0000 mg | Freq: Every day | RECTAL | Status: DC | PRN

## 2014-04-19 MED ORDER — BIOTIN 5000 MCG PO CAPS: 5000.0000 ug | ORAL_CAPSULE | Freq: Every day | ORAL | Status: DC

## 2014-04-19 MED ORDER — ZOLPIDEM TARTRATE 5 MG PO TABS
5.0000 mg | ORAL_TABLET | Freq: Every evening | ORAL | Status: DC | PRN
  Administered 2014-04-20: 5 mg via ORAL
  Filled 2014-04-19: qty 1

## 2014-04-19 MED ORDER — MIRABEGRON ER 50 MG PO TB24
50.0000 mg | ORAL_TABLET | Freq: Every evening | ORAL | Status: DC
  Administered 2014-04-19 – 2014-04-20 (×2): 50 mg via ORAL
  Filled 2014-04-19 (×4): qty 1

## 2014-04-19 MED ORDER — VITAMIN D (ERGOCALCIFEROL) 1.25 MG (50000 UNIT) PO CAPS: 50000.0000 [IU] | ORAL_CAPSULE | ORAL | Status: DC

## 2014-04-19 MED ORDER — LEVOTHYROXINE SODIUM 75 MCG PO TABS
75.0000 ug | ORAL_TABLET | Freq: Every day | ORAL | Status: DC
  Administered 2014-04-20 – 2014-04-21 (×2): 75 ug via ORAL
  Filled 2014-04-19 (×3): qty 1

## 2014-04-19 MED ORDER — MIRTAZAPINE 7.5 MG PO TABS
7.5000 mg | ORAL_TABLET | Freq: Every day | ORAL | Status: DC
  Administered 2014-04-19 – 2014-04-20 (×2): 7.5 mg via ORAL
  Filled 2014-04-19 (×3): qty 1

## 2014-04-19 MED ORDER — SODIUM CHLORIDE 0.9 % IV BOLUS (SEPSIS)
500.0000 mL | Freq: Once | INTRAVENOUS | Status: AC
  Administered 2014-04-19: 500 mL via INTRAVENOUS

## 2014-04-19 MED ORDER — VENLAFAXINE HCL ER 75 MG PO CP24
75.0000 mg | ORAL_CAPSULE | Freq: Two times a day (BID) | ORAL | Status: DC
  Administered 2014-04-19 – 2014-04-21 (×4): 75 mg via ORAL
  Filled 2014-04-19 (×5): qty 1

## 2014-04-19 MED ORDER — ATORVASTATIN CALCIUM 10 MG PO TABS
10.0000 mg | ORAL_TABLET | Freq: Every day | ORAL | Status: DC
  Administered 2014-04-19 – 2014-04-20 (×2): 10 mg via ORAL
  Filled 2014-04-19 (×3): qty 1

## 2014-04-19 MED ORDER — ENOXAPARIN SODIUM 30 MG/0.3ML ~~LOC~~ SOLN
30.0000 mg | SUBCUTANEOUS | Status: DC
  Administered 2014-04-19 – 2014-04-20 (×2): 30 mg via SUBCUTANEOUS
  Filled 2014-04-19 (×3): qty 0.3

## 2014-04-19 MED ORDER — ASPIRIN EC 325 MG PO TBEC
325.0000 mg | DELAYED_RELEASE_TABLET | Freq: Every day | ORAL | Status: DC
  Administered 2014-04-19 – 2014-04-21 (×3): 325 mg via ORAL
  Filled 2014-04-19 (×3): qty 1

## 2014-04-19 MED ORDER — SENNOSIDES-DOCUSATE SODIUM 8.6-50 MG PO TABS: 1.0000 | ORAL_TABLET | Freq: Every evening | ORAL | Status: DC | PRN

## 2014-04-19 MED ORDER — ACETAMINOPHEN 500 MG PO TABS
1000.0000 mg | ORAL_TABLET | Freq: Four times a day (QID) | ORAL | Status: DC | PRN
  Administered 2014-04-19: 1000 mg via ORAL
  Filled 2014-04-19: qty 2

## 2014-04-19 MED ORDER — DEXTROSE 5 % IV SOLN
1.0000 g | INTRAVENOUS | Status: DC
  Administered 2014-04-19 – 2014-04-20 (×2): 1 g via INTRAVENOUS
  Filled 2014-04-19 (×3): qty 10

## 2014-04-19 MED ORDER — GABAPENTIN 400 MG PO CAPS
400.0000 mg | ORAL_CAPSULE | Freq: Three times a day (TID) | ORAL | Status: DC
  Administered 2014-04-19 – 2014-04-21 (×5): 400 mg via ORAL
  Filled 2014-04-19 (×7): qty 1

## 2014-04-19 MED ORDER — METOPROLOL SUCCINATE ER 25 MG PO TB24
25.0000 mg | ORAL_TABLET | Freq: Two times a day (BID) | ORAL | Status: DC
  Administered 2014-04-19 – 2014-04-21 (×4): 25 mg via ORAL
  Filled 2014-04-19 (×5): qty 1

## 2014-04-19 MED ORDER — OXYCODONE HCL 5 MG PO TABS
5.0000 mg | ORAL_TABLET | Freq: Once | ORAL | Status: AC
  Administered 2014-04-19: 5 mg via ORAL
  Filled 2014-04-19: qty 1

## 2014-04-19 NOTE — Progress Notes (Signed)
Patient arrived to 4N 17 via stretcher and was transferred to the bed. She was AAOx4 but forgetful. Vital signs were taken, Tele was placed and call bell was put by her side. Patient complains of right sided hip pain. Will continue to monitor. Dayton ScrapeSarah E Jacklynn Lynch

## 2014-04-19 NOTE — H&P (Addendum)
Hospitalist Admission History and Physical  Patient name: Chelsea Lynch Medical record number: [id number] Date of birth: June 12, 1925 Age: 80 y.o. Gender: female  Primary Care Provider: [name], MD  Chief Complaint: encephalopathy, ataxia, ? TIA  History of Present Illness:This is a 80 y.o. year old female multiple medical problems including dementia, CVA, paroxysmal afib, AAA, HTN, presenting with encephalopathy and ataxia. Daughter states that pt has been more confused over the past 2 days. Pt has also been complaining of inability to ambulate. Daughter went to pick up pt from cardiology appt today when she noticed pt with persistent ataxia and confusion. Daughter also reports some mild worsening of baseline slurred speech s/p prior CVA. Denies any recent infections. Has had falls at home, though no direct head trauma. Daughter states that pt has been getting treated for UTI over past 2 weeks. Was transitioned from one abx to another because of persistent sxs.  Presented to the ER today. Afebrile. Hemodynamically stable. WBC 8.0. Hgb 13.7. Cr 1.87 (baseline around 1.2). Head CT with old microvascular disease and lacunar infarcts but no acute abnormalities. CXR negative. UA mildly indicative of infection.   Patient Active Problem List   Diagnosis Date Noted  . Encephalopathy 04/19/2014  . Pyelonephritis 01/08/2014  . HCAP (healthcare-associated pneumonia) 01/08/2014  . Adnexal mass 01/08/2014  . Dementia with behavioral disturbance 01/08/2014  . Acute CVA (cerebrovascular accident) 11/08/2013  . CVA (cerebral infarction) 11/08/2013  . Chest pain with moderate risk for cardiac etiology 05/15/2013  . PAF (paroxysmal atrial fibrillation)   . Dysphagia, unspecified(787.20) 04/02/2013  . Opiate withdrawal 03/29/2013  . Unspecified hypothyroidism 03/28/2013  . Normocytic anemia 03/28/2013  . GERD (gastroesophageal reflux disease) 03/28/2013  . Infiltrate noted on imaging study  03/28/2013  . Elevated troponin- felt to be secondary to SVT 03/28/2013  . SVT (supraventricular tachycardia) 03/28/2013  . Urosepsis 03/27/2013  . Bacteremia due to Gram-negative bacteria 03/27/2013  . Septic shock(785.52) 03/27/2013  . Aspiration pneumonia 03/27/2013  . Acute on chronic renal insufficiency- resolved 03/27/2013  . BACK PAIN, CHRONIC- on chronic narcotics at home 01/23/2010  . NONSPECIFIC ABN FINDNG RAD&OTH EXAM BILARY TRCT 01/23/2010  . TIA 01/22/2010  . DIVERTICULOSIS, COLON 01/22/2010  . GASTRITIS, ACUTE 01/05/2008  . CONSTIPATION, SLOW TRANSIT 01/05/2008   Past Medical History: Past Medical History  Diagnosis Date  . GERD (gastroesophageal reflux disease)   . Depression   . Neuropathy   . Constipation, chronic   . Insomnia   . Chronic pain   . Hypothyroid   . Anxiety   . Frequent falls     "not much in the last few months" (11/08/2013)  . PAF (paroxysmal atrial fibrillation)     not on anticoagulation due to a fall risk, wore an Event monitor 02//23/11-03/03/11  . PAT (paroxysmal atrial tachycardia)     Supraventricular tachycardia; most recent episode associated with sepsis  . Hypertension   . AAA (abdominal aortic aneurysm) without rupture     Followed by routine Dopplers  . Spinal stenosis   . Complication of anesthesia     "it's hard for me to wakeup" (11/08/2013)  . Pneumonia     "several years ago" (11/08/2013)  . Chronic bronchitis     "hasn't had it in quite a few years" (11/08/2013)  . Shortness of breath     "can come about at anytime" (11/08/2013)  . Sleep apnea     "doesn't wear a mask; RX'd but wouldn't get one" (11/08/2013)  . Headache(784.0)     "  at least weekly; sometimes daily" (11/08/2013)  . Stroke ?1980's    denies residual on 11/08/2013  . DDD (degenerative disc disease)     "all of her spine" (11/08/2013)  . Arthritis     "joints; back; hands are getting pretty bad" (11/08/2013)  . Chronic lower back pain   . Chronic leg pain      "nerve pain" (11/08/2013)  . Dementia     "confusion comes and goes; mostly situational; does not have good short term memory but won't admit it" (11/08/2013)    Past Surgical History: Past Surgical History  Procedure Laterality Date  . Nm myocar perf wall motion  June 2014    No ischemia or infarction, mild apical breast attenuation, EF roughly 70%  . Doppler echocardiography  03/2013    Normal EF 66 5%. Mild/moderate aortic regurgitation, mildly dilated RV with moderate pulmonary hypertension  . Abdomnal aorta  02/25/2012    mid abd. aorta  mid segment diltaton 3.93 x 4.55 cm greatest diameter.mild amt atherosclerosis without evidence of sigificant diamter reduction  . Lower arterial extremities doppler  02/19/2011    right abi 0.98,left 0.97  . Renal doppler  02/12/2011    abd aorta prox 2.6 x 3.1 cm,mid 4.1 x 3.6 cm, distal 2.3 x 3.4 cm, right renal artery 1-59% left renal artery norm. ,both renal size normal  . Renal doppler  02/11/2010    infrarenal AAA mearsuring 3.6 x 3.7 cm  . Appendectomy    . Cholecystectomy    . Eye surgery    . Lumbar disc surgery  X 3  . Tonsillectomy    . Knee arthroscopy Right ?1980's  . Vaginal hysterectomy    . Dilation and curettage of uterus      "a few" (11/08/2013)  . Cataract extraction w/ intraocular lens  implant, bilateral Bilateral 2000's  . Anterior cervical decomp/discectomy fusion      Social History: History   Social History  . Marital Status: Married    Spouse Name: N/A    Number of Children: N/A  . Years of Education: N/A   Social History Main Topics  . Smoking status: Former Smoker -- 0.50 packs/day for 10 years    Types: Cigarettes  . Smokeless tobacco: Never Used  . Alcohol Use: Yes     Comment: 11/08/2013 "used to drink some; never had problem w/it; haven't had a drink in years"  . Drug Use: No  . Sexual Activity: No   Other Topics Concern  . None   Social History Narrative   She is a married mother of 2,  grandmother 4 with now 2 great-grandchildren. She routinely exercises, currently using the assistance of home health PT. She does use a walker. She is always accompanied by her daughter, who does say that she is not all that functional sports getting around the house and being much more than just mild activities.   She does not smoke or drink alcohol.    Family History: Family History  Problem Relation Age of Onset  . Stroke Mother   . Heart attack Father   . Lung cancer Sister   . Bone cancer Sister   . High blood pressure    . Heart disease      Allergies: Allergies  Allergen Reactions  . Clarithromycin Other (See Comments)    Unknown reaction    . Codeine Nausea And Vomiting  . Tequin [Gatifloxacin] Other (See Comments)    Unknown reaction.  Tolerates Levaquin.  Marland Kitchen  Penicillins Rash    Current Facility-Administered Medications  Medication Dose Route Frequency Provider Last Rate Last Dose  . 0.9 %  sodium chloride infusion   Intravenous Continuous Suzi RootsKevin E Steinl, MD      . acetaminophen (TYLENOL) tablet 1,000 mg  1,000 mg Oral Q6H PRN Doree AlbeeSteven Xane Amsden, MD      . aspirin EC tablet 325 mg  325 mg Oral Daily Doree AlbeeSteven Apryle Stowell, MD      . Melene Muller[START ON 04/20/2014] atorvastatin (LIPITOR) tablet 10 mg  10 mg Oral q1800 Doree AlbeeSteven Thais Silberstein, MD      . Biotin CAPS 5,000 mcg  5,000 mcg Oral Daily Doree AlbeeSteven Nanette Wirsing, MD      . bisacodyl (DULCOLAX) suppository 10 mg  10 mg Rectal Daily PRN Doree AlbeeSteven Quayshawn Nin, MD      . cefTRIAXone (ROCEPHIN) 1 g in dextrose 5 % 50 mL IVPB  1 g Intravenous Q24H Suzi RootsKevin E Steinl, MD 100 mL/hr at 04/19/14 1857 1 g at 04/19/14 1857  . enoxaparin (LOVENOX) injection 30 mg  30 mg Subcutaneous Q24H Doree AlbeeSteven Tejal Monroy, MD      . gabapentin (NEURONTIN) capsule 400 mg  400 mg Oral TID Doree AlbeeSteven Annalise Mcdiarmid, MD      . Melene Muller[START ON 04/20/2014] levothyroxine (SYNTHROID, LEVOTHROID) tablet 75 mcg  75 mcg Oral QAC breakfast Doree AlbeeSteven David Towson, MD      . metoprolol succinate (TOPROL-XL) 24 hr tablet 25 mg  25 mg Oral BID  Doree AlbeeSteven Evalin Shawhan, MD      . mirabegron ER [facility](MYRBETRIQ) tablet 50 mg  50 mg Oral QPM Doree AlbeeSteven Bassem Bernasconi, MD      . mirtazapine (REMERON) tablet 7.5 mg  7.5 mg Oral QHS Doree AlbeeSteven Cornie Mccomber, MD      . senna-docusate (Senokot-S) tablet 1 tablet  1 tablet Oral QHS PRN Doree AlbeeSteven Alfons Sulkowski, MD      . venlafaxine XR (EFFEXOR-XR) 24 hr capsule 75 mg  75 mg Oral BID Doree AlbeeSteven Symon Norwood, MD      . Vitamin D (Ergocalciferol) (DRISDOL) capsule 50,000 Units  50,000 Units Oral Q7 days Doree AlbeeSteven Baudelia Schroepfer, MD      . zolpidem [facility](AMBIEN) tablet 5 mg  5 mg Oral QHS PRN Doree AlbeeSteven Mahika Vanvoorhis, MD       Current Outpatient Prescriptions  Medication Sig Dispense Refill  . acetaminophen (TYLENOL) 500 MG tablet Take 1,000 mg by mouth every 6 (six) hours as needed for mild pain.      Marland Kitchen. aspirin EC 325 MG tablet Take 1 tablet (325 mg total) by mouth daily.  30 tablet  0  . atorvastatin (LIPITOR) 10 MG tablet Take 1 tablet (10 mg total) by mouth daily at 6 PM.  30 tablet  1  . Biotin 5000 MCG CAPS Take 5,000 mcg by mouth daily.      . bisacodyl (DULCOLAX) 10 MG suppository Place 10 mg rectally daily as needed for mild constipation or moderate constipation.      . docusate sodium (COLACE) 100 MG capsule Take 100 mg by mouth daily with supper.      . fish oil-omega-3 fatty acids 1000 MG capsule Take 2 g by mouth every morning.       . furosemide (LASIX) 20 MG tablet Take 20-40 mg by mouth every morning.       . gabapentin (NEURONTIN) 400 MG capsule Take 400 mg by mouth 3 (three) times daily.      Marland Kitchen. levothyroxine (SYNTHROID, LEVOTHROID) 75 MCG tablet Take 1 tablet (75 mcg total) by mouth daily before breakfast.  30 tablet  0  . metoprolol succinate (TOPROL-XL) 50 MG 24 hr tablet Take 25 mg by mouth 2 (two) times daily. Take with or immediately following a meal.      . mirabegron ER (MYRBETRIQ) 50 MG TB24 Take 50 mg by mouth every evening.       . mirtazapine (REMERON) 7.5 MG tablet Take 7.5 mg by mouth at bedtime.      . nitrofurantoin, macrocrystal-monohydrate,  (MACROBID) 100 MG capsule Take 100 mg by mouth every evening.      Marland Kitchen. oxyCODONE (OXYCONTIN) 10 MG 12 hr tablet Take 10 mg by mouth every 12 (twelve) hours.      . potassium chloride (K-DUR,KLOR-CON) 10 MEQ tablet Take 10 mEq by mouth every other day.      . trimethoprim (TRIMPEX) 100 MG tablet Take 100 mg by mouth 2 (two) times daily.      Marland Kitchen. venlafaxine XR (EFFEXOR-XR) 75 MG 24 hr capsule Take 75 mg by mouth 2 (two) times daily.      . Vitamin D, Ergocalciferol, (DRISDOL) 50000 UNITS CAPS Take 50,000 Units by mouth every 7 (seven) days. wednesdays      . zaleplon (SONATA) 10 MG capsule Take 10 mg by mouth at bedtime.       Review Of Systems: 12 point ROS negative except as noted above in HPI.  Physical Exam: Filed Vitals:   04/19/14 1921  BP: 150/72  Pulse: 62  Temp:   Resp: 20    General: cooperative and confused  HEENT: PERRLA and extra ocular movement intact Heart: S1, S2 normal, no murmur, rub or gallop, regular rate and rhythm Lungs: clear to auscultation, no wheezes or rales and unlabored breathing Abdomen: abdomen is soft without significant tenderness, masses, organomegaly or guarding Extremities: extremities normal, atraumatic, no cyanosis or edema Skin:no rashes Neurology: normal without focal findings  Labs and Imaging: Lab Results  Component Value Date/Time   NA 141 04/19/2014  2:44 PM   K 4.3 04/19/2014  2:44 PM   CL 102 04/19/2014  2:44 PM   CO2 27 04/19/2014  2:44 PM   BUN 39* 04/19/2014  2:44 PM   CREATININE 1.87* 04/19/2014  2:44 PM   GLUCOSE 96 04/19/2014  2:44 PM   Lab Results  Component Value Date   WBC 8.0 04/19/2014   HGB 13.7 04/19/2014   HCT 40.8 04/19/2014   MCV 91.5 04/19/2014   PLT 254 04/19/2014   Urinalysis    Component Value Date/Time   COLORURINE YELLOW 04/19/2014 1540   APPEARANCEUR CLEAR 04/19/2014 1540   LABSPEC 1.016 04/19/2014 1540   PHURINE 6.5 04/19/2014 1540   GLUCOSEU NEGATIVE 04/19/2014 1540   HGBUR NEGATIVE 04/19/2014 1540   BILIRUBINUR  NEGATIVE 04/19/2014 1540   KETONESUR NEGATIVE 04/19/2014 1540   PROTEINUR NEGATIVE 04/19/2014 1540   UROBILINOGEN 0.2 04/19/2014 1540   NITRITE NEGATIVE 04/19/2014 1540   LEUKOCYTESUR SMALL* 04/19/2014 1540       Dg Chest 2 View  04/19/2014   CLINICAL DATA:  Leg weakness and chest pain  EXAM: CHEST  2 VIEW  COMPARISON:  DG CHEST 2V dated 03/07/2014; DG CHEST 2 VIEW dated 11/08/2013; CT T SPINE W/CM dated 12/31/2010  FINDINGS: The heart size is normal. Prominent calcified aortic arch stable. Spinal stimulators noted.  No consolidation or effusion. Thoracolumbar compression deformity stable from 11/08/2013.  IMPRESSION: No active cardiopulmonary disease.   Electronically Signed   By: Esperanza Heiraymond  Rubner M.D.   On: 04/19/2014 16:12   Ct Head Wo Contrast  04/19/2014   CLINICAL DATA:  Weakness. Unable to ambulate. Abnormal mental status.  EXAM: CT HEAD WITHOUT CONTRAST  TECHNIQUE: Contiguous axial images were obtained from the base of the skull through the vertex without intravenous contrast.  COMPARISON:  Head CT 11/09/2013.  FINDINGS: Mild cerebral atrophy. Patchy and confluent areas of decreased attenuation are noted throughout the deep and periventricular white matter of the cerebral hemispheres bilaterally, compatible with chronic microvascular ischemic disease. Several well-defined foci of low attenuation in the basal ganglia bilaterally are compatible with old lacunar infarcts, largest of which is in the head of the right caudate nucleus. No acute intracranial abnormalities. Specifically, no evidence of acute intracranial hemorrhage, no definite findings of acute/subacute cerebral ischemia, no mass, mass effect, hydrocephalus or abnormal intra or extra-axial fluid collections. Visualized paranasal sinuses and mastoids are well pneumatized. No acute displaced skull fractures are identified.  IMPRESSION: 1. No acute intracranial abnormalities. 2. Mild cerebral atrophy with extensive chronic microvascular ischemic  changes in the cerebral white matter and old lacunar infarcts redemonstrated, as above.   Electronically Signed   By: Trudie Reed M.D.   On: 04/19/2014 18:23     Assessment and Plan: ANIQA HARE is a 80 y.o. year old female presenting with encephalopathy, ataxia, ? TIA   Encephalopathy: Broad ddx for sxs including metabolic, infectious, hypovolemia. Some concern for neurogenic sources, ie TIA. Will proceed down stroke pathway including MRI, MRA, carotid dopplers, 2D ECHO, risk stratification labs. Neuro consult pending. Continue full dose ASA in the interim. Treat UTI. IV rocephin (pharmacy to clarify PCN allergy). Check ammonia level. Hydrate pt.   UTI: as above. Treat with IV rocephin. Noted multiple medication allergys including macrolide and fluroquinolone.Pan culture. Pharmacy to clarify medication allergies.   HTN/Afib: BP stable. HR well controlled. Not anticoagulation candidate given recurrent falls at home. Continue home regimen. Hold diuretic given mild dehydration and AKI.   AAA: Asymptomatic currently. Followed by vascular surgery outpt. Will need imaging if pt develops abd pain.   AKI: suspect prerenal etiology in setting of current presentation and diuretic use. Hydrate pt. Reassess.   FEN/GI: NPO pending bedside swallow eval.  Prophylaxis: lovenox  Disposition: pending further evaluation  Code Status:Full Code.        Doree Albee MD  Pager: [phone]

## 2014-04-19 NOTE — ED Notes (Signed)
Pt complaining of weakness that started yesterday- pt normally is ambulatory. Pt unable to ambulate, not talking very much today per family, doesn't seem herself. Pt has hx of UTI- taking abx for this. Pt is alert and oriented for EMS. BP 120/88, HR 76, 98% room air. CBG 180.

## 2014-04-19 NOTE — ED Provider Notes (Addendum)
CSN: [id number]     Arrival date & time 04/19/14  1421 History   First MD Initiated Contact with Patient 04/19/14 1422     Chief Complaint  Patient presents with  . Weakness     (Consider location/radiation/quality/duration/timing/severity/associated sxs/prior Treatment) Patient is a 80 y.o. female presenting with weakness. The history is provided by the patient and a relative.  Weakness Pertinent negatives include no chest pain, no abdominal pain, no headaches and no shortness of breath.  pt with hx utis, demenetia, afib, presents from home w family member who states today pt seems generally weak, not able to ambulate, seems more confused and at times w blank stare.  Last appeared at baseline yesterday. Daughter notes currently being treated w second round of abx for uti. Pt w normal appetite. No nvd. No dysuria. No abd or flank pain. No fever or chills. Denies headache. No cough or uri c/o. No chest pain or discomfort. Does feel sob at times, unchanged from baseline. No new leg swelling, orthopnea or pnd. Compliant w normal meds, and new abx. Denies trauma/fall. No syncope or loc. Pt currently sitting upright, smiles, conversant, responds appropriately, more consistent w her normal baseline. At baseline walks short distances w walker, although occasionally legs will give way.     Past Medical History  Diagnosis Date  . GERD (gastroesophageal reflux disease)   . Depression   . Neuropathy   . Constipation, chronic   . Insomnia   . Chronic pain   . Hypothyroid   . Anxiety   . Frequent falls     "not much in the last few months" (11/08/2013)  . PAF (paroxysmal atrial fibrillation)     not on anticoagulation due to a fall risk, wore an Event monitor 02//23/11-03/03/11  . PAT (paroxysmal atrial tachycardia)     Supraventricular tachycardia; most recent episode associated with sepsis  . Hypertension   . AAA (abdominal aortic aneurysm) without rupture     Followed by routine Dopplers  .  Spinal stenosis   . Complication of anesthesia     "it's hard for me to wakeup" (11/08/2013)  . Pneumonia     "several years ago" (11/08/2013)  . Chronic bronchitis     "hasn't had it in quite a few years" (11/08/2013)  . Shortness of breath     "can come about at anytime" (11/08/2013)  . Sleep apnea     "doesn't wear a mask; RX'd but wouldn't get one" (11/08/2013)  . Headache(784.0)     "at least weekly; sometimes daily" (11/08/2013)  . Stroke ?1980's    denies residual on 11/08/2013  . DDD (degenerative disc disease)     "all of her spine" (11/08/2013)  . Arthritis     "joints; back; hands are getting pretty bad" (11/08/2013)  . Chronic lower back pain   . Chronic leg pain     "nerve pain" (11/08/2013)  . Dementia     "confusion comes and goes; mostly situational; does not have good short term memory but won't admit it" (11/08/2013)   Past Surgical History  Procedure Laterality Date  . Nm myocar perf wall motion  June 2014    No ischemia or infarction, mild apical breast attenuation, EF roughly 70%  . Doppler echocardiography  03/2013    Normal EF 66 5%. Mild/moderate aortic regurgitation, mildly dilated RV with moderate pulmonary hypertension  . Abdomnal aorta  02/25/2012    mid abd. aorta  mid segment diltaton 3.93 x 4.55 cm greatest diameter.mild  amt atherosclerosis without evidence of sigificant diamter reduction  . Lower arterial extremities doppler  02/19/2011    right abi 0.98,left 0.97  . Renal doppler  02/12/2011    abd aorta prox 2.6 x 3.1 cm,mid 4.1 x 3.6 cm, distal 2.3 x 3.4 cm, right renal artery 1-59% left renal artery norm. ,both renal size normal  . Renal doppler  02/11/2010    infrarenal AAA mearsuring 3.6 x 3.7 cm  . Appendectomy    . Cholecystectomy    . Eye surgery    . Lumbar disc surgery  X 3  . Tonsillectomy    . Knee arthroscopy Right ?1980's  . Vaginal hysterectomy    . Dilation and curettage of uterus      "a few" (11/08/2013)  . Cataract extraction w/  intraocular lens  implant, bilateral Bilateral 2000's  . Anterior cervical decomp/discectomy fusion     Family History  Problem Relation Age of Onset  . Stroke Mother   . Heart attack Father   . Lung cancer Sister   . Bone cancer Sister   . High blood pressure    . Heart disease     History  Substance Use Topics  . Smoking status: Former Smoker -- 0.50 packs/day for 10 years    Types: Cigarettes  . Smokeless tobacco: Never Used  . Alcohol Use: Yes     Comment: 11/08/2013 "used to drink some; never had problem w/it; haven't had a drink in years"   OB History   Grav Para Term Preterm Abortions TAB SAB Ect Mult Living                 Review of Systems  Constitutional: Negative for fever and chills.  HENT: Negative for sore throat.   Eyes: Negative for pain and visual disturbance.  Respiratory: Negative for cough and shortness of breath.   Cardiovascular: Negative for chest pain, palpitations and leg swelling.  Gastrointestinal: Negative for vomiting, abdominal pain, diarrhea and blood in stool.  Genitourinary: Negative for dysuria and flank pain.  Musculoskeletal: Negative for back pain and neck pain.  Skin: Negative for rash.  Neurological: Positive for weakness. Negative for numbness and headaches.  Hematological: Does not bruise/bleed easily.  Psychiatric/Behavioral: Positive for confusion.      Allergies  Clarithromycin; Codeine; Penicillins; and Tequin  Home Medications   Prior to Admission medications   Medication Sig Start Date End Date Taking? Authorizing Provider  acetaminophen (TYLENOL) 500 MG tablet Take 1,000 mg by mouth every 6 (six) hours as needed for mild pain.    Historical Provider, MD  aspirin EC 325 MG tablet Take 1 tablet (325 mg total) by mouth daily. 11/10/13   Esperanza SheetsUlugbek N Buriev, MD  atorvastatin (LIPITOR) 10 MG tablet Take 1 tablet (10 mg total) by mouth daily at 6 PM. 11/10/13   Esperanza SheetsUlugbek N Buriev, MD  Biotin 5000 MCG CAPS Take 5,000 mcg by mouth  daily.    Historical Provider, MD  bisacodyl (DULCOLAX) 10 MG suppository Place 10 mg rectally daily as needed for mild constipation or moderate constipation.    Historical Provider, MD  bisacodyl (DULCOLAX) 5 MG EC tablet Take 5 mg by mouth daily as needed for moderate constipation.    Historical Provider, MD  docusate sodium (COLACE) 100 MG capsule Take 100 mg by mouth daily with supper.    Historical Provider, MD  fish oil-omega-3 fatty acids 1000 MG capsule Take 2 g by mouth every morning.     Historical Provider,  MD  furosemide (LASIX) 20 MG tablet Take 20 mg by mouth every morning.    Historical Provider, MD  gabapentin (NEURONTIN) 400 MG capsule Take 400 mg by mouth 3 (three) times daily.    Historical Provider, MD  levofloxacin (LEVAQUIN) 750 MG tablet Take 1 tablet (750 mg total) by mouth every other day. 01/11/14   Jerald KiefStephen K Chiu, MD  levothyroxine (SYNTHROID, LEVOTHROID) 75 MCG tablet Take 1 tablet (75 mcg total) by mouth daily before breakfast. 04/06/13   Joseph ArtJessica U Vann, DO  Melatonin 5 MG CAPS Take 4 capsules by mouth at bedtime as needed (sleep).    Historical Provider, MD  metoprolol succinate (TOPROL-XL) 50 MG 24 hr tablet Take 25 mg by mouth 2 (two) times daily. Take with or immediately following a meal. 04/06/13   Joseph ArtJessica U Vann, DO  mirabegron ER (MYRBETRIQ) 50 MG TB24 Take 50 mg by mouth every evening.     Historical Provider, MD  oxyCODONE (OXYCONTIN) 10 MG 12 hr tablet Take 10 mg by mouth every 12 (twelve) hours.    Historical Provider, MD  potassium chloride (K-DUR,KLOR-CON) 10 MEQ tablet Take 10 mEq by mouth every other day.    Historical Provider, MD  traZODone (DESYREL) 50 MG tablet Take 100 mg by mouth at bedtime.    Historical Provider, MD  venlafaxine XR (EFFEXOR-XR) 75 MG 24 hr capsule Take 75 mg by mouth 2 (two) times daily.    Historical Provider, MD  Vitamin D, Ergocalciferol, (DRISDOL) 50000 UNITS CAPS Take 50,000 Units by mouth every 7 (seven) days. wednesdays     Historical Provider, MD  zaleplon (SONATA) 10 MG capsule Take 10 mg by mouth at bedtime.    Historical Provider, MD   BP 126/78  Pulse 86  Temp(Src) 97.9 F (36.6 C) (Oral)  Resp 20 Physical Exam  Nursing note and vitals reviewed. Constitutional: She appears well-developed and well-nourished. No distress.  HENT:  Head: Atraumatic.  Mouth/Throat: Oropharynx is clear and moist.  Eyes: Conjunctivae are normal. Pupils are equal, round, and reactive to light. No scleral icterus.  Neck: Neck supple. No tracheal deviation present. No thyromegaly present.  No bruit  Cardiovascular: Normal rate, regular rhythm, normal heart sounds and intact distal pulses.   Pulmonary/Chest: Effort normal and breath sounds normal. No respiratory distress.  Abdominal: Soft. Normal appearance and bowel sounds are normal. She exhibits no distension and no mass. There is no tenderness. There is no rebound and no guarding.  Genitourinary:  No cva tenderness  Musculoskeletal: She exhibits no edema.  Neurological: She is alert. No cranial nerve deficit.  Alert, oriented to person/place. Speech clear/fluent. Motor intact bil. No pronator drift.   Skin: Skin is warm and dry. No rash noted. She is not diaphoretic.  Psychiatric: She has a normal mood and affect.    ED Course  Procedures (including critical care time)  Results for orders placed during the hospital encounter of 04/19/14  CBC      Result Value Ref Range   WBC 8.0  4.0 - 10.5 K/uL   RBC 4.46  3.87 - 5.11 MIL/uL   Hemoglobin 13.7  12.0 - 15.0 g/dL   HCT 16.140.8  09.636.0 - 04.546.0 %   MCV 91.5  78.0 - 100.0 fL   MCH 30.7  26.0 - 34.0 pg   MCHC 33.6  30.0 - 36.0 g/dL   RDW 40.914.0  81.111.5 - 91.415.5 %   Platelets 254  150 - 400 K/uL  COMPREHENSIVE METABOLIC PANEL  Result Value Ref Range   Sodium 141  137 - 147 mEq/L   Potassium 4.3  3.7 - 5.3 mEq/L   Chloride 102  96 - 112 mEq/L   CO2 27  19 - 32 mEq/L   Glucose, Bld 96  70 - 99 mg/dL   BUN 39 (*) 6 - 23  mg/dL   Creatinine, Ser 4.09 (*) 0.50 - 1.10 mg/dL   Calcium 9.8  8.4 - 81.1 mg/dL   Total Protein 7.8  6.0 - 8.3 g/dL   Albumin 4.1  3.5 - 5.2 g/dL   AST 19  0 - 37 U/L   ALT 15  0 - 35 U/L   Alkaline Phosphatase 93  39 - 117 U/L   Total Bilirubin 0.3  0.3 - 1.2 mg/dL   GFR calc non Af Amer 23 (*) >90 mL/min   GFR calc Af Amer 27 (*) >90 mL/min  URINALYSIS, ROUTINE W REFLEX MICROSCOPIC      Result Value Ref Range   Color, Urine YELLOW  YELLOW   APPearance CLEAR  CLEAR   Specific Gravity, Urine 1.016  1.005 - 1.030   pH 6.5  5.0 - 8.0   Glucose, UA NEGATIVE  NEGATIVE mg/dL   Hgb urine dipstick NEGATIVE  NEGATIVE   Bilirubin Urine NEGATIVE  NEGATIVE   Ketones, ur NEGATIVE  NEGATIVE mg/dL   Protein, ur NEGATIVE  NEGATIVE mg/dL   Urobilinogen, UA 0.2  0.0 - 1.0 mg/dL   Nitrite NEGATIVE  NEGATIVE   Leukocytes, UA SMALL (*) NEGATIVE  TROPONIN I      Result Value Ref Range   Troponin I <0.30  <0.30 ng/mL  URINE MICROSCOPIC-ADD ON      Result Value Ref Range   Squamous Epithelial / LPF RARE  RARE   WBC, UA 0-2  <3 WBC/hpf   Bacteria, UA FEW (*) RARE   Dg Chest 2 View  04/19/2014   CLINICAL DATA:  Leg weakness and chest pain  EXAM: CHEST  2 VIEW  COMPARISON:  DG CHEST 2V dated 03/07/2014; DG CHEST 2 VIEW dated 11/08/2013; CT T SPINE W/CM dated 12/31/2010  FINDINGS: The heart size is normal. Prominent calcified aortic arch stable. Spinal stimulators noted.  No consolidation or effusion. Thoracolumbar compression deformity stable from 11/08/2013.  IMPRESSION: No active cardiopulmonary disease.   Electronically Signed   By: Esperanza Heir M.D.   On: 04/19/2014 16:12   Ct Head Wo Contrast  04/19/2014   CLINICAL DATA:  Weakness. Unable to ambulate. Abnormal mental status.  EXAM: CT HEAD WITHOUT CONTRAST  TECHNIQUE: Contiguous axial images were obtained from the base of the skull through the vertex without intravenous contrast.  COMPARISON:  Head CT 11/09/2013.  FINDINGS: Mild cerebral atrophy.  Patchy and confluent areas of decreased attenuation are noted throughout the deep and periventricular white matter of the cerebral hemispheres bilaterally, compatible with chronic microvascular ischemic disease. Several well-defined foci of low attenuation in the basal ganglia bilaterally are compatible with old lacunar infarcts, largest of which is in the head of the right caudate nucleus. No acute intracranial abnormalities. Specifically, no evidence of acute intracranial hemorrhage, no definite findings of acute/subacute cerebral ischemia, no mass, mass effect, hydrocephalus or abnormal intra or extra-axial fluid collections. Visualized paranasal sinuses and mastoids are well pneumatized. No acute displaced skull fractures are identified.  IMPRESSION: 1. No acute intracranial abnormalities. 2. Mild cerebral atrophy with extensive chronic microvascular ischemic changes in the cerebral white matter and old lacunar infarcts redemonstrated, as  above.   Electronically Signed   By: Trudie Reed M.D.   On: 04/19/2014 18:23      EKG Interpretation   Date/Time:  Thursday Apr 19 2014 14:34:15 EDT Ventricular Rate:  86 PR Interval:  233 QRS Duration: 72 QT Interval:  389 QTC Calculation: 465 R Axis:   -4 Text Interpretation:  Sinus arrhythmia `no acute st/t changes compared to  prior ecg Confirmed by Denton Lank  MD, Caryn Bee ([id number]) on 04/19/2014 2:45:24 PM      MDM  Iv ns. Labs. Monitor.  Reviewed nursing notes and prior charts for additional history.   Cr elevated as compared to baseline c/w AKI.  Iv ns bolus.  Given acute kidney injury, change in mental status, weakness, inability to ambulate, will admit to med service.  hosp request abx for possible uti, cx is pending. Rocephin iv.   Admitting neurologist requests neurology consult re possible tias - I discussed pt with Dr Mervin Kung - he requests that if admitted team wants neuro consult that they contact him directly.      Suzi Roots, MD 04/19/14 [phone]

## 2014-04-19 NOTE — Consult Note (Signed)
Reason for Consult: Altered mental status with generalized weakness.  HPI:                                                                                                                                          Chelsea Lynch is an 80 y.o. female with a history of multiple medical problems including previous stroke, dementia, paroxysmal atrial fibrillation, AAA, and recent urinary tract infection, presenting with confusion and weakness. Onset of symptoms is somewhat unclear but was particularly noticeable last night. Patient's responses including speech output worse load. There was no actual slurring of speech. No facial droop was noted nor focal extremity weakness. She has complained of right hip and leg pain and has a history of spinal stenosis. CT scan of her head today showed diffuse white matter changes and lacunar infarctions. No acute abnormalities were noted. Patient has been afebrile. Laboratory studies showed BUN of 39 creatinine of 1.87. Urinalysis showed few bacteria, small number of leukocytes. WBC count was normal.  Past Medical History  Diagnosis Date  . GERD (gastroesophageal reflux disease)   . Depression   . Neuropathy   . Constipation, chronic   . Insomnia   . Chronic pain   . Hypothyroid   . Anxiety   . Frequent falls     "not much in the last few months" (11/08/2013)  . PAF (paroxysmal atrial fibrillation)     not on anticoagulation due to a fall risk, wore an Event monitor 02//23/11-03/03/11  . PAT (paroxysmal atrial tachycardia)     Supraventricular tachycardia; most recent episode associated with sepsis  . Hypertension   . AAA (abdominal aortic aneurysm) without rupture     Followed by routine Dopplers  . Spinal stenosis   . Complication of anesthesia     "it's hard for me to wakeup" (11/08/2013)  . Pneumonia     "several years ago" (11/08/2013)  . Chronic bronchitis     "hasn't had it in quite a few years" (11/08/2013)  . Shortness of breath     "can come about  at anytime" (11/08/2013)  . Sleep apnea     "doesn't wear a mask; RX'd but wouldn't get one" (11/08/2013)  . Headache(784.0)     "at least weekly; sometimes daily" (11/08/2013)  . Stroke ?1980's    denies residual on 11/08/2013  . DDD (degenerative disc disease)     "all of her spine" (11/08/2013)  . Arthritis     "joints; back; hands are getting pretty bad" (11/08/2013)  . Chronic lower back pain   . Chronic leg pain     "nerve pain" (11/08/2013)  . Dementia     "confusion comes and goes; mostly situational; does not have good short term memory but won't admit it" (11/08/2013)    Past Surgical History  Procedure Laterality Date  . Nm myocar perf wall motion  June 2014  No ischemia or infarction, mild apical breast attenuation, EF roughly 70%  . Doppler echocardiography  03/2013    Normal EF 66 5%. Mild/moderate aortic regurgitation, mildly dilated RV with moderate pulmonary hypertension  . Abdomnal aorta  02/25/2012    mid abd. aorta  mid segment diltaton 3.93 x 4.55 cm greatest diameter.mild amt atherosclerosis without evidence of sigificant diamter reduction  . Lower arterial extremities doppler  02/19/2011    right abi 0.98,left 0.97  . Renal doppler  02/12/2011    abd aorta prox 2.6 x 3.1 cm,mid 4.1 x 3.6 cm, distal 2.3 x 3.4 cm, right renal artery 1-59% left renal artery norm. ,both renal size normal  . Renal doppler  02/11/2010    infrarenal AAA mearsuring 3.6 x 3.7 cm  . Appendectomy    . Cholecystectomy    . Eye surgery    . Lumbar disc surgery  X 3  . Tonsillectomy    . Knee arthroscopy Right ?1980's  . Vaginal hysterectomy    . Dilation and curettage of uterus      "a few" (11/08/2013)  . Cataract extraction w/ intraocular lens  implant, bilateral Bilateral 2000's  . Anterior cervical decomp/discectomy fusion      Family History  Problem Relation Age of Onset  . Stroke Mother   . Heart attack Father   . Lung cancer Sister   . Bone cancer Sister   . High blood  pressure    . Heart disease      Social History:  reports that she has quit smoking. Her smoking use included Cigarettes. She has a 5 pack-year smoking history. She has never used smokeless tobacco. She reports that she drinks alcohol. She reports that she does not use illicit drugs.  Allergies  Allergen Reactions  . Clarithromycin Other (See Comments)    Unknown reaction    . Codeine Nausea And Vomiting  . Tequin [Gatifloxacin] Other (See Comments)    Unknown reaction.  Tolerates Levaquin.  Marland Kitchen Penicillins Rash    MEDICATIONS:                                                                                                                     I have reviewed the patient's current medications.   ROS:  History obtained from child and nighttime caretaker  General ROS: negative for - chills, fatigue, fever, night sweats, weight gain or weight loss Psychological ROS: As noted in history of present illness  Ophthalmic ROS: negative for - blurry vision, double vision, eye pain or loss of vision ENT ROS: negative for - epistaxis, nasal discharge, oral lesions, sore throat, tinnitus or vertigo Allergy and Immunology ROS: negative for - hives or itchy/watery eyes Hematological and Lymphatic ROS: negative for - bleeding problems, bruising or swollen lymph nodes Endocrine ROS: negative for - galactorrhea, hair pattern changes, polydipsia/polyuria or temperature intolerance Respiratory ROS: negative for - cough, hemoptysis, shortness of breath or wheezing Cardiovascular ROS: negative for - chest pain, dyspnea on exertion, edema or irregular heartbeat Gastrointestinal ROS: negative for - abdominal pain, diarrhea, hematemesis, nausea/vomiting or stool incontinence Genito-Urinary ROS: Currently on antibiotic regimen for urinary tract infection Musculoskeletal ROS: Back  and hip pain as well as right leg pain Neurological ROS: as noted in HPI Dermatological ROS: negative for rash and skin lesion changes   Blood pressure 157/88, pulse 63, temperature 97.6 F (36.4 C), temperature source Oral, resp. rate 16, height _0  (1.626 m), weight 64.547 kg (142 lb 4.8 oz), SpO2 98.00%.   Neurologic Examination:                                                                                                      Mental Status: Alert, oriented x3, but confused with frequent confabulations.  Speech fluent without evidence of aphasia. Able to follow commands without difficulty. Cranial Nerves: II-Visual fields were normal. III/IV/VI-Pupils were equal and reacted. Extraocular movements were full and conjugate.    V/VII-no facial numbness and no facial weakness. VIII-normal. X-normal speech and symmetrical palatal movement. Motor: 5/5 bilaterally with normal tone and bulk Sensory: Normal throughout. Deep Tendon Reflexes: Absent at knees and ankles. Plantars: Mute bilaterally Cerebellar: Normal finger-to-nose testing.  Lab Results  Component Value Date/Time   CHOL 188 11/09/2013  3:58 AM    Results for orders placed during the hospital encounter of 04/19/14 (from the past 48 hour(s))  CBC     Status: None   Collection Time    04/19/14  2:44 PM      Result Value Ref Range   WBC 8.0  4.0 - 10.5 K/uL   RBC 4.46  3.87 - 5.11 MIL/uL   Hemoglobin 13.7  12.0 - 15.0 g/dL   HCT 40.8  36.0 - 46.0 %   MCV 91.5  78.0 - 100.0 fL   MCH 30.7  26.0 - 34.0 pg   MCHC 33.6  30.0 - 36.0 g/dL   RDW 14.0  11.5 - 15.5 %   Platelets 254  150 - 400 K/uL  COMPREHENSIVE METABOLIC PANEL     Status: Abnormal   Collection Time    04/19/14  2:44 PM      Result Value Ref Range   Sodium 141  137 - 147 mEq/L   Potassium 4.3  3.7 - 5.3 mEq/L   Chloride 102  96 - 112 mEq/L   CO2  27  19 - 32 mEq/L   Glucose, Bld 96  70 - 99 mg/dL   BUN 39 (*) 6 - 23 mg/dL   Creatinine, Ser 1.87 (*)  0.50 - 1.10 mg/dL   Calcium 9.8  8.4 - 10.5 mg/dL   Total Protein 7.8  6.0 - 8.3 g/dL   Albumin 4.1  3.5 - 5.2 g/dL   AST 19  0 - 37 U/L   ALT 15  0 - 35 U/L   Alkaline Phosphatase 93  39 - 117 U/L   Total Bilirubin 0.3  0.3 - 1.2 mg/dL   GFR calc non Af Amer 23 (*) >90 mL/min   GFR calc Af Amer 27 (*) >90 mL/min   Comment: (NOTE)     The eGFR has been calculated using the CKD EPI equation.     This calculation has not been validated in all clinical situations.     eGFR's persistently <90 mL/min signify possible Chronic Kidney     Disease.  TROPONIN I     Status: None   Collection Time    04/19/14  2:44 PM      Result Value Ref Range   Troponin I <0.30  <0.30 ng/mL   Comment:            Due to the release kinetics of cTnI,     a negative result within the first hours     of the onset of symptoms does not rule out     myocardial infarction with certainty.     If myocardial infarction is still suspected,     repeat the test at appropriate intervals.  URINALYSIS, ROUTINE W REFLEX MICROSCOPIC     Status: Abnormal   Collection Time    04/19/14  3:40 PM      Result Value Ref Range   Color, Urine YELLOW  YELLOW   APPearance CLEAR  CLEAR   Specific Gravity, Urine 1.016  1.005 - 1.030   pH 6.5  5.0 - 8.0   Glucose, UA NEGATIVE  NEGATIVE mg/dL   Hgb urine dipstick NEGATIVE  NEGATIVE   Bilirubin Urine NEGATIVE  NEGATIVE   Ketones, ur NEGATIVE  NEGATIVE mg/dL   Protein, ur NEGATIVE  NEGATIVE mg/dL   Urobilinogen, UA 0.2  0.0 - 1.0 mg/dL   Nitrite NEGATIVE  NEGATIVE   Leukocytes, UA SMALL (*) NEGATIVE  URINE MICROSCOPIC-ADD ON     Status: Abnormal   Collection Time    04/19/14  3:40 PM      Result Value Ref Range   Squamous Epithelial / LPF RARE  RARE   WBC, UA 0-2  <3 WBC/hpf   Bacteria, UA FEW (*) RARE  CBC     Status: None   Collection Time    04/19/14  9:05 PM      Result Value Ref Range   WBC 8.0  4.0 - 10.5 K/uL   RBC 4.26  3.87 - 5.11 MIL/uL   Hemoglobin 12.9  12.0 -  15.0 g/dL   HCT 38.8  36.0 - 46.0 %   MCV 91.1  78.0 - 100.0 fL   MCH 30.3  26.0 - 34.0 pg   MCHC 33.2  30.0 - 36.0 g/dL   RDW 14.0  11.5 - 15.5 %   Platelets 203  150 - 400 K/uL  CREATININE, SERUM     Status: Abnormal   Collection Time    04/19/14  9:05 PM      Result Value Ref Range  Creatinine, Ser 1.55 (*) 0.50 - 1.10 mg/dL   GFR calc non Af Amer 29 (*) >90 mL/min   GFR calc Af Amer 34 (*) >90 mL/min   Comment: (NOTE)     The eGFR has been calculated using the CKD EPI equation.     This calculation has not been validated in all clinical situations.     eGFR's persistently <90 mL/min signify possible Chronic Kidney     Disease.    Dg Chest 2 View  04/19/2014   CLINICAL DATA:  Leg weakness and chest pain  EXAM: CHEST  2 VIEW  COMPARISON:  DG CHEST 2V dated 03/07/2014; DG CHEST 2 VIEW dated 11/08/2013; CT T SPINE W/CM dated 12/31/2010  FINDINGS: The heart size is normal. Prominent calcified aortic arch stable. Spinal stimulators noted.  No consolidation or effusion. Thoracolumbar compression deformity stable from 11/08/2013.  IMPRESSION: No active cardiopulmonary disease.   Electronically Signed   By: Skipper Cliche M.D.   On: 04/19/2014 16:12   Ct Head Wo Contrast  04/19/2014   CLINICAL DATA:  Weakness. Unable to ambulate. Abnormal mental status.  EXAM: CT HEAD WITHOUT CONTRAST  TECHNIQUE: Contiguous axial images were obtained from the base of the skull through the vertex without intravenous contrast.  COMPARISON:  Head CT 11/09/2013.  FINDINGS: Mild cerebral atrophy. Patchy and confluent areas of decreased attenuation are noted throughout the deep and periventricular white matter of the cerebral hemispheres bilaterally, compatible with chronic microvascular ischemic disease. Several well-defined foci of low attenuation in the basal ganglia bilaterally are compatible with old lacunar infarcts, largest of which is in the head of the right caudate nucleus. No acute intracranial abnormalities.  Specifically, no evidence of acute intracranial hemorrhage, no definite findings of acute/subacute cerebral ischemia, no mass, mass effect, hydrocephalus or abnormal intra or extra-axial fluid collections. Visualized paranasal sinuses and mastoids are well pneumatized. No acute displaced skull fractures are identified.  IMPRESSION: 1. No acute intracranial abnormalities. 2. Mild cerebral atrophy with extensive chronic microvascular ischemic changes in the cerebral white matter and old lacunar infarcts redemonstrated, as above.   Electronically Signed   By: Vinnie Langton M.D.   On: 04/19/2014 18:23   Assessment/Plan: 80 year old lady with multiple medical problems presenting with altered mental status with confusion as well as complaint of generalized weakness. Etiology is unclear. Patient has no clear signs of acute infectious process. As well there is no indication of acute stroke. TIA is unlikely but cannot be completely ruled out.  Recommendations: 1. MRI of the brain to rule out acute/subacute stroke. 2. Stroke workup, including risk assessment if MRI shows recent stroke. 3. Consider routine EEG if confusion continues 4. Vitamin B12 and folate levels, TSH and RPR 5. Physical therapy consult  We will continue to follow this patient with you.  C.R. Nicole Kindred, MD [facility] [phone]  04/19/2014, 11:01 PM

## 2014-04-19 NOTE — Progress Notes (Signed)
Unit CM UR Completed by [facility] ED CM  W. Iliyana Convey RN  

## 2014-04-20 ENCOUNTER — Observation Stay (HOSPITAL_COMMUNITY): Payer: Medicare Other

## 2014-04-20 DIAGNOSIS — R5381 Other malaise: Secondary | ICD-10-CM

## 2014-04-20 DIAGNOSIS — I359 Nonrheumatic aortic valve disorder, unspecified: Secondary | ICD-10-CM

## 2014-04-20 DIAGNOSIS — N179 Acute kidney failure, unspecified: Secondary | ICD-10-CM | POA: Diagnosis present

## 2014-04-20 DIAGNOSIS — R5383 Other fatigue: Secondary | ICD-10-CM

## 2014-04-20 LAB — LIPID PANEL
CHOLESTEROL: 132 mg/dL (ref 0–200)
HDL: 51 mg/dL (ref >39)
LDL Cholesterol: 65 mg/dL (ref 0–99)
TRIGLYCERIDES: 82 mg/dL (ref <150)
Total CHOL/HDL Ratio: 2.6 RATIO
VLDL: 16 mg/dL (ref 0–40)

## 2014-04-20 LAB — RPR

## 2014-04-20 LAB — HEMOGLOBIN A1C
Hgb A1c MFr Bld: 5.7 % — ABNORMAL HIGH (ref <5.7)
Mean Plasma Glucose: 117 mg/dL — ABNORMAL HIGH (ref <117)

## 2014-04-20 LAB — TSH: TSH: 2.38 u[IU]/mL (ref 0.350–4.500)

## 2014-04-20 LAB — HIV ANTIBODY (ROUTINE TESTING W REFLEX): HIV: NONREACTIVE

## 2014-04-20 LAB — VITAMIN B12: Vitamin B-12: 222 pg/mL (ref 211–911)

## 2014-04-20 MED ORDER — METOPROLOL TARTRATE 1 MG/ML IV SOLN
5.0000 mg | INTRAVENOUS | Status: DC | PRN
Start: 2014-04-20 — End: 2014-04-21

## 2014-04-20 MED ORDER — PANTOPRAZOLE SODIUM 40 MG PO TBEC
40.0000 mg | DELAYED_RELEASE_TABLET | Freq: Every day | ORAL | Status: DC
  Administered 2014-04-20 – 2014-04-21 (×2): 40 mg via ORAL
  Filled 2014-04-20 (×2): qty 1

## 2014-04-20 MED ORDER — PNEUMOCOCCAL VAC POLYVALENT 25 MCG/0.5ML IJ INJ
0.5000 mL | INJECTION | INTRAMUSCULAR | Status: DC
  Filled 2014-04-20: qty 0.5

## 2014-04-20 MED ORDER — SODIUM CHLORIDE 0.9 % IV SOLN
INTRAVENOUS | Status: AC
  Administered 2014-04-20: 11:00:00 via INTRAVENOUS
  Administered 2014-04-21: 1000 mL via INTRAVENOUS

## 2014-04-20 MED ORDER — DOCUSATE SODIUM 100 MG PO CAPS
100.0000 mg | ORAL_CAPSULE | Freq: Every day | ORAL | Status: DC
  Administered 2014-04-20: 100 mg via ORAL
  Filled 2014-04-20: qty 1

## 2014-04-20 NOTE — Progress Notes (Signed)
  Echocardiogram 2D Echocardiogram has been performed.  Neysa BonitoChristy F Bruce Mayers 04/20/2014, 10:15 AM

## 2014-04-20 NOTE — Progress Notes (Signed)
VASCULAR LAB PRELIMINARY  PRELIMINARY  PRELIMINARY  PRELIMINARY  Carotid duplex  completed.    Preliminary report: 1-39% ICA stenosis.  Mild plaque disease.   Vanna ScotlandFrances G Jettie Lazare, RVT 04/20/2014, 6:00 PM

## 2014-04-20 NOTE — Progress Notes (Signed)
MRI needs more information on the type of stimulator implant the patient has in order to tell if the patient will be able to complete the scan. Patient's home health aid is trying to get the information from family at this time.

## 2014-04-20 NOTE — Progress Notes (Signed)
UR completed 

## 2014-04-20 NOTE — Evaluation (Signed)
Speech Language Pathology Evaluation Patient Details Name: Chelsea Lynch MRN: [id number] DOB: 30-May-1925 Today's Date: 04/20/2014 Time: [id number] SLP Time Calculation (min): 25 min  Problem List:  Patient Active Problem List   Diagnosis Date Noted  . ARF (acute renal failure) 04/20/2014  . Encephalopathy 04/19/2014  . Pyelonephritis 01/08/2014  . HCAP (healthcare-associated pneumonia) 01/08/2014  . Adnexal mass 01/08/2014  . Dementia with behavioral disturbance 01/08/2014  . Acute CVA (cerebrovascular accident) 11/08/2013  . CVA (cerebral infarction) 11/08/2013  . Chest pain with moderate risk for cardiac etiology 05/15/2013  . PAF (paroxysmal atrial fibrillation)   . Dysphagia, unspecified(787.20) 04/02/2013  . Opiate withdrawal 03/29/2013  . Unspecified hypothyroidism 03/28/2013  . Normocytic anemia 03/28/2013  . GERD (gastroesophageal reflux disease) 03/28/2013  . Infiltrate noted on imaging study 03/28/2013  . Elevated troponin- felt to be secondary to SVT 03/28/2013  . SVT (supraventricular tachycardia) 03/28/2013  . Urosepsis 03/27/2013  . Bacteremia due to Gram-negative bacteria 03/27/2013  . Septic shock(785.52) 03/27/2013  . Aspiration pneumonia 03/27/2013  . Acute on chronic renal insufficiency- resolved 03/27/2013  . BACK PAIN, CHRONIC- on chronic narcotics at home 01/23/2010  . NONSPECIFIC ABN FINDNG RAD&OTH EXAM BILARY TRCT 01/23/2010  . TIA 01/22/2010  . DIVERTICULOSIS, COLON 01/22/2010  . GASTRITIS, ACUTE 01/05/2008  . CONSTIPATION, SLOW TRANSIT 01/05/2008   Past Medical History:  Past Medical History  Diagnosis Date  . GERD (gastroesophageal reflux disease)   . Depression   . Neuropathy   . Constipation, chronic   . Insomnia   . Chronic pain   . Hypothyroid   . Anxiety   . Frequent falls     "not much in the last few months" (11/08/2013)  . PAF (paroxysmal atrial fibrillation)     not on anticoagulation due to a fall risk, wore an Event monitor  02//23/11-03/03/11  . PAT (paroxysmal atrial tachycardia)     Supraventricular tachycardia; most recent episode associated with sepsis  . Hypertension   . AAA (abdominal aortic aneurysm) without rupture     Followed by routine Dopplers  . Spinal stenosis   . Complication of anesthesia     "it's hard for me to wakeup" (11/08/2013)  . Pneumonia     "several years ago" (11/08/2013)  . Chronic bronchitis     "hasn't had it in quite a few years" (11/08/2013)  . Shortness of breath     "can come about at anytime" (11/08/2013)  . Sleep apnea     "doesn't wear a mask; RX'd but wouldn't get one" (11/08/2013)  . Headache(784.0)     "at least weekly; sometimes daily" (11/08/2013)  . Stroke ?1980's    denies residual on 11/08/2013  . DDD (degenerative disc disease)     "all of her spine" (11/08/2013)  . Arthritis     "joints; back; hands are getting pretty bad" (11/08/2013)  . Chronic lower back pain   . Chronic leg pain     "nerve pain" (11/08/2013)  . Dementia     "confusion comes and goes; mostly situational; does not have good short term memory but won't admit it" (11/08/2013)   Past Surgical History:  Past Surgical History  Procedure Laterality Date  . Nm myocar perf wall motion  June 2014    No ischemia or infarction, mild apical breast attenuation, EF roughly 70%  . Doppler echocardiography  03/2013    Normal EF 66 5%. Mild/moderate aortic regurgitation, mildly dilated RV with moderate pulmonary hypertension  . Abdomnal aorta  02/25/2012  mid abd. aorta  mid segment diltaton 3.93 x 4.55 cm greatest diameter.mild amt atherosclerosis without evidence of sigificant diamter reduction  . Lower arterial extremities doppler  02/19/2011    right abi 0.98,left 0.97  . Renal doppler  02/12/2011    abd aorta prox 2.6 x 3.1 cm,mid 4.1 x 3.6 cm, distal 2.3 x 3.4 cm, right renal artery 1-59% left renal artery norm. ,both renal size normal  . Renal doppler  02/11/2010    infrarenal AAA mearsuring 3.6 x  3.7 cm  . Appendectomy    . Cholecystectomy    . Eye surgery    . Lumbar disc surgery  X 3  . Tonsillectomy    . Knee arthroscopy Right ?1980's  . Vaginal hysterectomy    . Dilation and curettage of uterus      "a few" (11/08/2013)  . Cataract extraction w/ intraocular lens  implant, bilateral Bilateral 2000's  . Anterior cervical decomp/discectomy fusion     HPI:  80 year old female admitted 04/19/14 with encephalopathy/ataxia, ? TIA. PMH significant for dementia, CVA (12/14) with residual dysarthria, dysphagia, Aspiration PNA, GERD.  CT and CXR negative for acute findings. SLE ordered to assess cognitive linguistic status.   Assessment / Plan / Recommendation Clinical Impression  Pt currently lethargic, but rouses briefly to answer simple questions. Pt has 24-hour sitters, 2 of whom were present this morning. They report rapid change in pt status earlier this week, from baseline level of conversation and ADL's Monday to decreased responsiveness, increased confusion, increased sleeping, and poor po intake by Wednesday.  Pt underwent SLE in December of 2014, and cognition was deemed at baseline.  At this time, pt is able to awaken briefly, but is unable to maintain alertness to allow for reliable full cognitive assessment.  Given caregiver report of decline over the past week, SLP will continue diagnostic treatment as pt tolerates.    SLP Assessment  Patient needs continued Speech Lanaguage Pathology Services    Follow Up Recommendations  Home health SLP    Frequency and Duration min 1 x/week  1 week   Pertinent Vitals/Pain VSS, pt reports no pain   SLP Goals  SLP Goals Potential to Achieve Goals: Fair Potential Considerations: Severity of impairments;Family/community support;Ability to learn/carryover information;Previous level of function;Cooperation/participation level;Co-morbidities  SLP Evaluation Prior Functioning  Cognitive/Linguistic Baseline: Baseline deficits Type of  Home: House  Lives With: Spouse (24 hour hour care) Available Help at Discharge: Family;Personal care attendant;Available 24 hours/day Vocation: Retired   IT consultantCognition  Overall Cognitive Status: History of cognitive impairments - at baseline Arousal/Alertness: Lethargic Attention: Focused Focused Attention: Impaired Focused Attention Impairment: Verbal basic    Comprehension  Auditory Comprehension Overall Auditory Comprehension: Appears within functional limits for tasks assessed    Expression Expression Primary Mode of Expression: Verbal Verbal Expression Overall Verbal Expression: Appears within functional limits for tasks assessed   Oral / Motor Oral Motor/Sensory Function Overall Oral Motor/Sensory Function: Appears within functional limits for tasks assessed Motor Speech Overall Motor Speech: Impaired at baseline   GO Functional Limitations: Attention Attention Current Status ([id number]): At least 80 percent but less than 100 percent impaired, limited or restricted Attention Goal Status (U0454(G9166): At least 40 percent but less than 60 percent impaired, limited or restricted   Aamir Mclinden B. Murvin NatalBueche, Gy[facility] Lane Endoscopy Suites IncMSP, CCC-SLP [phone]  Gray BernhardtCelia B Zoriana Oats 04/20/2014, 9:38 AM

## 2014-04-20 NOTE — Progress Notes (Signed)
Sports coachersonal sitter, provided by family, told the nurse tech that the patient would be needing a band aid on her arm from where she fell earlier. NT alerted nurse. Personal sitter stated that the patient became confused around 2100, saying that she needed to go out in hallway to find her husband. When the personal sitter got patient up with walker and assistance, patient stumbled over her own feet and hit her arm on the bed then fell to the floor. Pt and sitter denies hitting head. Pt denies any acute pain. Sitter stated that pt did not want staff to know and that is why she had not said anything up until this point. Sitter educated on fall policy and told to call for staff asistance before providing care to patient. On call NP notified with no new orders at this time. Family notified. Will continue to monitor.

## 2014-04-20 NOTE — Clinical Social Work Note (Signed)
CSW received consult for possible SNF placement at time of discharge. Per chart review, pt to be discharged home with husband. CSW signing off. Please reconsult if discharge disposition changes. Thank you for the consult.  Darlyn ChamberEmily [place], LCSWA Clinical Social Worker [phone]

## 2014-04-20 NOTE — Progress Notes (Addendum)
Patient Demographics  Chelsea Lynch, is a 80 y.o. female, DOB - 1925/04/02, AOZ:[id number]  Admit date - 04/19/2014   Admitting Physician Doree AlbeeSteven Newton, MD  Outpatient Primary MD for the patient is Londell MohPHARR,WALTER DAVIDSON, MD  LOS - 1   Chief Complaint  Patient presents with  . Weakness        Assessment & Plan     1. Acute metabolic encephalopathy in a patient with dementia-CVA. Most likely caused by acute renal failure plus possible mild UTI, head CT unremarkable, CVA to be ruled out, echo and carotid duplex pending, will be seen by PT/OT and speech, neuro following. Mentation somewhat improved per caregiver bedside. Antibiotics for UTI.  Lab Results  Component Value Date   CHOL 132 04/20/2014   HDL 51 04/20/2014   LDLCALC 65 04/20/2014   TRIG 82 04/20/2014   CHOLHDL 2.6 04/20/2014    Lab Results  Component Value Date   HGBA1C 5.8* 11/08/2013      2. Acute renal failure. Clinically appears dehydrated, gentle hydration repeat BMP in the morning baseline creatinine 1.2.   3. Dementia. Her husband not formally diagnosed, will place her on low-dose Aricept and monitor.   4. Right hip pain chronic. X-ray checked severe arthritis, supportive care.   5. Hypothyroidism. Continue home dose Synthroid check TSH.  Lab Results  Component Value Date   TSH 2.871 01/08/2014    6. GERD. PPI  .    7. History of paroxysmal atrial fibrillation along with SVT. On PO metoprolol add IV Lopressor when necessary , not on anticoagulation due to high fall risk.    8. Dyslipidemia. On statin continue.    9. Possible UTI. On Rocephin, monitor cultures.     Code Status: Full  Family Communication: Discussed in detail with husband on 04/20/2014  Disposition Plan: Home per  husband   Procedures CT head, carotids, echo, MRI ordered however she has a spinal stimulator implant and he might not be able to do MRI    Consults Neuro   Medications  Scheduled Meds: . aspirin EC  325 mg Oral Daily  . atorvastatin  10 mg Oral q1800  . cefTRIAXone (ROCEPHIN)  IV  1 g Intravenous Q24H  . enoxaparin (LOVENOX) injection  30 mg Subcutaneous Q24H  . gabapentin  400 mg Oral TID  . levothyroxine  75 mcg Oral QAC breakfast  . metoprolol succinate  25 mg Oral BID  . mirabegron ER  50 mg Oral QPM  . mirtazapine  7.5 mg Oral QHS  . [START ON 04/21/2014] pneumococcal 23 valent vaccine  0.5 mL Intramuscular Tomorrow-1000  . venlafaxine XR  75 mg Oral BID  . [START ON 04/25/2014] Vitamin D (Ergocalciferol)  50,000 Units Oral Q7 days   Continuous Infusions: . sodium chloride     PRN Meds:.acetaminophen, bisacodyl, senna-docusate, zolpidem  DVT Prophylaxis  Lovenox    Lab Results  Component Value Date   PLT 203 04/19/2014    Antibiotics     Anti-infectives   Start     Dose/Rate Route Frequency Ordered Stop   04/19/14 1900  cefTRIAXone (ROCEPHIN) 1 g in dextrose 5 % 50 mL IVPB     1 g 100 mL/hr over 30 Minutes Intravenous Every 24 hours  04/19/14 1850            Subjective:   Chelsea Bloomer today has, No headache, No chest pain, No abdominal pain - No Nausea, No new weakness tingling or numbness, No Cough - SOB.    Objective:   Filed Vitals:   04/19/14 2329 04/20/14 0135 04/20/14 0334 04/20/14 0526  BP: 143/81 133/66 125/62 152/74  Pulse: 71 66 70 63  Temp: 98.1 F (36.7 C) 97.8 F (36.6 C) 97.5 F (36.4 C) 97.9 F (36.6 C)  TempSrc: Oral Oral Oral Oral  Resp: 16 16 16 16   Height:      Weight:      SpO2: 93% 95% 96% 96%    Wt Readings from Last 3 Encounters:  04/19/14 64.547 kg (142 lb 4.8 oz)  01/08/14 64.1 kg (141 lb 5 oz)  11/10/13 61.054 kg (134 lb 9.6 oz)     Intake/Output Summary (Last 24 hours) at 04/20/14 0936 Last data filed at  04/19/14 1857  Gross per 24 hour  Intake      0 ml  Output    300 ml  Net   -300 ml     Physical Exam  Awake , pleasantly confused, Oriented X 0, No new F.N deficits, Normal affect [place].AT,PERRAL Supple Neck,No JVD, No cervical lymphadenopathy appriciated.  Symmetrical Chest wall movement, Good air movement bilaterally, CTAB RRR,No Gallops,Rubs or new Murmurs, No Parasternal Heave +ve B.Sounds, Abd Soft, Non tender, No organomegaly appriciated, No rebound - guarding or rigidity. No Cyanosis, Clubbing or edema, No new Rash or bruise     Data Review   Micro Results No results found for this or any previous visit (from the past 240 hour(s)).  Radiology Reports Dg Chest 2 View  04/19/2014   CLINICAL DATA:  Leg weakness and chest pain  EXAM: CHEST  2 VIEW  COMPARISON:  DG CHEST 2V dated 03/07/2014; DG CHEST 2 VIEW dated 11/08/2013; CT T SPINE W/CM dated 12/31/2010  FINDINGS: The heart size is normal. Prominent calcified aortic arch stable. Spinal stimulators noted.  No consolidation or effusion. Thoracolumbar compression deformity stable from 11/08/2013.  IMPRESSION: No active cardiopulmonary disease.   Electronically Signed   By: Esperanza Heir M.D.   On: 04/19/2014 16:12   Dg Hip Complete Right  04/20/2014   CLINICAL DATA:  Right hip pain without injury.  EXAM: RIGHT HIP - COMPLETE 2+ VIEW  COMPARISON:  None.  FINDINGS: Severe narrowing of the right hip joint is noted. No significant osteophyte formation is noted. There is no evidence of arthropathy or other focal bone abnormality.  IMPRESSION: Severe degenerative joint disease of the right hip. No acute fracture or dislocation is noted.   Electronically Signed   By: Roque Lias M.D.   On: 04/20/2014 09:06   Ct Head Wo Contrast  04/19/2014   CLINICAL DATA:  Weakness. Unable to ambulate. Abnormal mental status.  EXAM: CT HEAD WITHOUT CONTRAST  TECHNIQUE: Contiguous axial images were obtained from the base of the skull through the vertex  without intravenous contrast.  COMPARISON:  Head CT 11/09/2013.  FINDINGS: Mild cerebral atrophy. Patchy and confluent areas of decreased attenuation are noted throughout the deep and periventricular white matter of the cerebral hemispheres bilaterally, compatible with chronic microvascular ischemic disease. Several well-defined foci of low attenuation in the basal ganglia bilaterally are compatible with old lacunar infarcts, largest of which is in the head of the right caudate nucleus. No acute intracranial abnormalities. Specifically, no evidence of acute intracranial  hemorrhage, no definite findings of acute/subacute cerebral ischemia, no mass, mass effect, hydrocephalus or abnormal intra or extra-axial fluid collections. Visualized paranasal sinuses and mastoids are well pneumatized. No acute displaced skull fractures are identified.  IMPRESSION: 1. No acute intracranial abnormalities. 2. Mild cerebral atrophy with extensive chronic microvascular ischemic changes in the cerebral white matter and old lacunar infarcts redemonstrated, as above.   Electronically Signed   By: Trudie Reedaniel  Entrikin M.D.   On: 04/19/2014 18:23    CBC  Recent Labs Lab 04/19/14 1444 04/19/14 2105  WBC 8.0 8.0  HGB 13.7 12.9  HCT 40.8 38.8  PLT 254 203  MCV 91.5 91.1  MCH 30.7 30.3  MCHC 33.6 33.2  RDW 14.0 14.0    Chemistries   Recent Labs Lab 04/19/14 1444 04/19/14 2105  NA 141  --   K 4.3  --   CL 102  --   CO2 27  --   GLUCOSE 96  --   BUN 39*  --   CREATININE 1.87* 1.55*  CALCIUM 9.8  --   AST 19  --   ALT 15  --   ALKPHOS 93  --   BILITOT 0.3  --    ------------------------------------------------------------------------------------------------------------------ estimated creatinine clearance is 22.1 ml/min (by C-G formula based on Cr of 1.55). ------------------------------------------------------------------------------------------------------------------ No results found for this basename:  HGBA1C,  in the last 72 hours ------------------------------------------------------------------------------------------------------------------  Recent Labs  04/20/14 0514  CHOL 132  HDL 51  LDLCALC 65  TRIG 82  CHOLHDL 2.6   ------------------------------------------------------------------------------------------------------------------ No results found for this basename: TSH, T4TOTAL, FREET3, T3FREE, THYROIDAB,  in the last 72 hours ------------------------------------------------------------------------------------------------------------------ No results found for this basename: VITAMINB12, FOLATE, FERRITIN, TIBC, IRON, RETICCTPCT,  in the last 72 hours  Coagulation profile No results found for this basename: INR, PROTIME,  in the last 168 hours  No results found for this basename: DDIMER,  in the last 72 hours  Cardiac Enzymes  Recent Labs Lab 04/19/14 1444  TROPONINI <0.30   ------------------------------------------------------------------------------------------------------------------ No components found with this basename: POCBNP,      Time Spent in minutes   35   Leroy SeaPrashant K Singh M.D on 04/20/2014 at 9:36 AM  Between 7am to 7pm - Pager - [phone]  After 7pm go to [email or web address] - password [email or web address]  And look for the night coverage person covering for me after hours  [facility] Office  [phone]

## 2014-04-20 NOTE — Evaluation (Signed)
Physical Therapy Evaluation Patient Details Name: Chelsea Lynch MRN: [id number] DOB: Apr 05, 1925 Today's Date: 04/20/2014   History of Present Illness  This is a 80 y.o. year old female multiple medical problems including dementia, CVA, paroxysmal afib, AAA, HTN, presenting with encephalopathy and ataxia.  Clinical Impression  Pt admitted with/for encephalopathy and UTI.  Pt currently limited functionally due to the problems listed below.  (see problems list.)  Pt will benefit from PT to maximize function and safety to be able to get home safely with available assist of family and PCA's.     Follow Up Recommendations Home health PT    Equipment Recommendations  None recommended by PT    Recommendations for Other Services       Precautions / Restrictions Precautions Precautions: Fall      Mobility  Bed Mobility Overal bed mobility: Needs Assistance Bed Mobility: Supine to Sit     Supine to sit: Min assist;HOB elevated (with rail  (she has a rail on her bed at home))     General bed mobility comments: struggles to get her weight shifted forward,  quick to fatigue  Transfers Overall transfer level: Needs assistance   Transfers: Sit to/from Stand Sit to Stand: Min assist         General transfer comment: cues for technique and hand placement; assist to help her come forward.  Ambulation/Gait Ambulation/Gait assistance: Min assist;Mod assist Ambulation Distance (Feet): 50 Feet Assistive device: Rolling walker (2 wheeled) Gait Pattern/deviations: Step-through pattern;Decreased step length - right;Decreased step length - left;Decreased stride length   Gait velocity interpretation: Below normal speed for age/gender General Gait Details: frequent cues and assist to help pt keep her knees extended in stance.  Tends to sink into bil flexion   Stairs            Wheelchair Mobility    Modified Rankin (Stroke Patients Only)       Balance Overall balance  assessment: Needs assistance Sitting-balance support: Feet supported;Single extremity supported Sitting balance-Leahy Scale: Poor Sitting balance - Comments: lists posteriorly without use of rail.   Standing balance support: Bilateral upper extremity supported Standing balance-Leahy Scale: Poor                               Pertinent Vitals/Pain     Home Living Family/patient expects to be discharged to:: Private residence Living Arrangements: Spouse/significant other Available Help at Discharge: Family;Personal care attendant;Available 24 hours/day Type of Home: House Home Access: Stairs to enter Entrance Stairs-Rails: Doctor, general practiceight;Left Entrance Stairs-Number of Steps: 5 Home Layout: One level Home Equipment: Cane - quad;Walker - 4 wheels;Bedside commode;Shower seat Additional Comments: caregivers  24/7 for ADLs prn , cooking, supervision, laundry, med mgt, grocery shopping    Prior Function Level of Independence: Independent with assistive device(s);Needs assistance (in her home environment with caregivers always avail.)   Gait / Transfers Assistance Needed: amb with 4wh walker     Comments: Need A with stairs in front of house, occasionally with showering and toileting     Hand Dominance   Dominant Hand: Right    Extremity/Trunk Assessment   Upper Extremity Assessment: Defer to OT evaluation           Lower Extremity Assessment: Generalized weakness (L LE noticeably weaker than R and shows some inccordination)         Communication   Communication: No difficulties  Cognition Arousal/Alertness: Awake/alert Behavior During Therapy: WFL for  tasks assessed/performed Overall Cognitive Status: History of cognitive impairments - at baseline                      General Comments      Exercises        Assessment/Plan    PT Assessment Patient needs continued PT services  PT Diagnosis Difficulty walking;Generalized weakness   PT Problem  List Decreased strength;Decreased activity tolerance;Decreased balance;Decreased mobility;Decreased coordination;Decreased knowledge of use of DME  PT Treatment Interventions DME instruction;Gait training;Stair training;Functional mobility training;Therapeutic activities;Patient/family education   PT Goals (Current goals can be found in the Care Plan section) Acute Rehab PT Goals Patient Stated Goal: agreed whe could use strengthening to get around better PT Goal Formulation: With patient Time For Goal Achievement: 04/27/14 Potential to Achieve Goals: Good    Frequency Min 3X/week   Barriers to discharge        Co-evaluation               End of Session   Activity Tolerance: Patient tolerated treatment well;Patient limited by fatigue Patient left: in chair;with call bell/phone within reach;with family/visitor present Nurse Communication: Mobility status    Functional Assessment Tool Used: clinical judgement and assist used Functional Limitation: Mobility: Walking and moving around Mobility: Walking and Moving Around Current Status ([id number]): At least 20 percent but less than 40 percent impaired, limited or restricted Mobility: Walking and Moving Around Goal Status [phone]): At least 1 percent but less than 20 percent impaired, limited or restricted    Time: 1355-1425 PT Time Calculation (min): 30 min   Charges:   PT Evaluation $Initial PT Evaluation Tier I: 1 Procedure PT Treatments $Gait Training: 8-22 mins $Therapeutic Activity: 8-22 mins   PT G Codes:   Functional Assessment Tool Used: clinical judgement and assist used Functional Limitation: Mobility: Walking and moving around    The Sherwin-WilliamsKenneth V Rajohn Henery 04/20/2014, 2:40 PM 04/20/2014  [place] BingKen Tanzie Rothschild, PT [phone]  (pager)

## 2014-04-21 ENCOUNTER — Observation Stay (HOSPITAL_COMMUNITY): Payer: Medicare Other

## 2014-04-21 LAB — BASIC METABOLIC PANEL
BUN: 28 mg/dL — AB (ref 6–23)
CALCIUM: 9 mg/dL (ref 8.4–10.5)
CHLORIDE: 111 meq/L (ref 96–112)
CO2: 23 mEq/L (ref 19–32)
Creatinine, Ser: 1.34 mg/dL — ABNORMAL HIGH (ref 0.50–1.10)
GFR calc non Af Amer: 35 mL/min — ABNORMAL LOW (ref >90)
GFR, EST AFRICAN AMERICAN: 40 mL/min — AB (ref >90)
Glucose, Bld: 99 mg/dL (ref 70–99)
Potassium: 4.9 mEq/L (ref 3.7–5.3)
Sodium: 145 mEq/L (ref 137–147)

## 2014-04-21 LAB — URINE CULTURE
Colony Count: NO GROWTH
Culture: NO GROWTH

## 2014-04-21 LAB — VITAMIN D 25 HYDROXY (VIT D DEFICIENCY, FRACTURES): Vit D, 25-Hydroxy: 67 ng/mL (ref 30–89)

## 2014-04-21 LAB — MAGNESIUM: Magnesium: 1.9 mg/dL (ref 1.5–2.5)

## 2014-04-21 MED ORDER — CEFUROXIME AXETIL 500 MG PO TABS: 500.0000 mg | ORAL_TABLET | Freq: Two times a day (BID) | ORAL | Status: DC

## 2014-04-21 MED ORDER — DONEPEZIL HCL 5 MG PO TABS: 5.0000 mg | ORAL_TABLET | Freq: Every day | ORAL | Status: DC

## 2014-04-21 NOTE — Discharge Instructions (Signed)
Follow with Primary MD Chelsea Lynch,WALTER DAVIDSON, MD in 7 days   Get CBC, CMP, checked 7 days by Primary MD and again as instructed by your Primary MD.     Activity: As tolerated with Full fall precautions use walker/cane & assistance as needed   Disposition Home with HHPT   Diet: Heart Healthy with feeding assistance and aspiration precautions if needed.  For Heart failure patients - Check your Weight same time everyday, if you gain over 2 pounds, or you develop in leg swelling, experience more shortness of breath or chest pain, call your Primary MD immediately. Follow Cardiac Low Salt Diet and 1.8 lit/day fluid restriction.   On your next visit with her primary care physician please Get Medicines reviewed and adjusted.  Please request your Prim.MD to go over all Hospital Tests and Procedure/Radiological results at the follow up, please get all Hospital records sent to your Prim MD by signing hospital release before you go home.   If you experience worsening of your admission symptoms, develop shortness of breath, life threatening emergency, suicidal or homicidal thoughts you must seek medical attention immediately by calling 911 or calling your MD immediately  if symptoms less severe.  You Must read complete instructions/literature along with all the possible adverse reactions/side effects for all the Medicines you take and that have been prescribed to you. Take any new Medicines after you have completely understood and accpet all the possible adverse reactions/side effects.   Do not drive and provide baby sitting services if your were admitted for syncope or siezures until you have seen by Primary MD or a Neurologist and advised to do so again.  Do not drive when taking Pain medications.    Do not take more than prescribed Pain, Sleep and Anxiety Medications  Special Instructions: If you have smoked or chewed Tobacco  in the last 2 yrs please stop smoking, stop any regular Alcohol  and  or any Recreational drug use.  Wear Seat belts while driving.   Please note  You were cared for by a hospitalist during your hospital stay. If you have any questions about your discharge medications or the care you received while you were in the hospital after you are discharged, you can call the unit and asked to speak with the hospitalist on call if the hospitalist that took care of you is not available. Once you are discharged, your primary care physician will handle any further medical issues. Please note that NO REFILLS for any discharge medications will be authorized once you are discharged, as it is imperative that you return to your primary care physician (or establish a relationship with a primary care physician if you do not have one) for your aftercare needs so that they can reassess your need for medications and monitor your lab values.

## 2014-04-21 NOTE — Progress Notes (Signed)
Patient placed in soft bilateral wrist restraints, she removed one IV and was attempting to remove the other, constantly pulling telemetry leads off. Also person sitting with patient attempted to lossen and did un tie right wrist restraint. Patient is in camera room were she is under full observation and I would recommend a [facility] employee as a sitter so that the restraints could possible be removed.

## 2014-04-21 NOTE — Care Management Note (Signed)
    Page 1 of 1   04/21/2014     4:23:07 PM CARE MANAGEMENT NOTE 04/21/2014  Patient:  Chelsea Lynch,Chelsea Lynch   Account Number:  [id number]  Date Initiated:  04/21/2014  Documentation initiated by:  [facility],Daanish Copes  Subjective/Objective Assessment:   adm: encephalopathy, ataxia, ? TIA     Action/Plan:   discharge planning   Anticipated DC Date:  04/21/2014   Anticipated DC Plan:  HOME W HOME HEALTH SERVICES      DC Planning Services  CM consult      [facility]   Choice offered to / List presented to:  C-1 Patient        HH arranged  HH-1 RN  HH-2 PT  HH-6 SOCIAL WORKER      HH agency  [facility]   Status of service:  Completed, signed off Medicare Important Message given?  YES (If response is "NO", the following Medicare IM given date fields will be blank) Date Medicare IM given:  04/19/2014 Date Additional Medicare IM given:    Discharge Disposition:  HOME W HOME HEALTH SERVICES  Per UR Regulation:    If discussed at Long Length of Stay Meetings, dates discussed:    Comments:  04/21/14 15:30 CM spoke with pt inroom to offer choice. Pt's daughter, Rosalita Chessmansuzanne was also in room and states her mother has had home health with [facility].  No DME requested. Address and contact information verified with Rosalita ChessmanSuzanne. Referral called to [facility] rep, Marny LowensteinUrsula.  No other CM needs were communicated.  Freddy JakschSarah Arye Weyenberg, BSN, CM [phone].

## 2014-04-21 NOTE — Progress Notes (Signed)
Patient would not stop pulling on IV tubing and telemetry leads, she pulled her IV out. Wraped new IV in curlez and put mitts on patient for her own safety. Will continue to monitor.

## 2014-04-21 NOTE — Discharge Summary (Signed)
Chelsea Lynch, is a 80 y.o. female  DOB 1925-11-07  MRN [id number].  Admission date:  04/19/2014  Admitting Physician  Doree Albee, MD  Discharge Date:  04/21/2014   Primary MD  [name], MD  Recommendations for primary care physician for things to follow:   Repeat CBC BMP in a week, follow final urine culture results   Admission Diagnosis  Dehydration [276.51] Altered mental status [780.97] Weakness [780.79] Acute kidney injury [584.9]   Discharge Diagnosis  Dehydration [276.51] Altered mental status [780.97] Weakness [780.79] Acute kidney injury [584.9]     Principal Problem:   Encephalopathy Active Problems:   TIA   Unspecified hypothyroidism   Normocytic anemia   GERD (gastroesophageal reflux disease)   PAF (paroxysmal atrial fibrillation)   CVA (cerebral infarction)   Dementia with behavioral disturbance   ARF (acute renal failure)      Past Medical History  Diagnosis Date  . GERD (gastroesophageal reflux disease)   . Depression   . Neuropathy   . Constipation, chronic   . Insomnia   . Chronic pain   . Hypothyroid   . Anxiety   . Frequent falls     "not much in the last few months" (11/08/2013)  . PAF (paroxysmal atrial fibrillation)     not on anticoagulation due to a fall risk, wore an Event monitor 02//23/11-03/03/11  . PAT (paroxysmal atrial tachycardia)     Supraventricular tachycardia; most recent episode associated with sepsis  . Hypertension   . AAA (abdominal aortic aneurysm) without rupture     Followed by routine Dopplers  . Spinal stenosis   . Complication of anesthesia     "it's hard for me to wakeup" (11/08/2013)  . Pneumonia     "several years ago" (11/08/2013)  . Chronic bronchitis     "hasn't had it in quite a few years" (11/08/2013)  . Shortness of breath     "can  come about at anytime" (11/08/2013)  . Sleep apnea     "doesn't wear a mask; RX'd but wouldn't get one" (11/08/2013)  . Headache(784.0)     "at least weekly; sometimes daily" (11/08/2013)  . Stroke ?1980's    denies residual on 11/08/2013  . DDD (degenerative disc disease)     "all of her spine" (11/08/2013)  . Arthritis     "joints; back; hands are getting pretty bad" (11/08/2013)  . Chronic lower back pain   . Chronic leg pain     "nerve pain" (11/08/2013)  . Dementia     "confusion comes and goes; mostly situational; does not have good short term memory but won't admit it" (11/08/2013)    Past Surgical History  Procedure Laterality Date  . Nm myocar perf wall motion  June 2014    No ischemia or infarction, mild apical breast attenuation, EF roughly 70%  . Doppler echocardiography  03/2013    Normal EF 66 5%. Mild/moderate aortic regurgitation, mildly dilated RV with moderate pulmonary hypertension  . Abdomnal aorta  02/25/2012  mid abd. aorta  mid segment diltaton 3.93 x 4.55 cm greatest diameter.mild amt atherosclerosis without evidence of sigificant diamter reduction  . Lower arterial extremities doppler  02/19/2011    right abi 0.98,left 0.97  . Renal doppler  02/12/2011    abd aorta prox 2.6 x 3.1 cm,mid 4.1 x 3.6 cm, distal 2.3 x 3.4 cm, right renal artery 1-59% left renal artery norm. ,both renal size normal  . Renal doppler  02/11/2010    infrarenal AAA mearsuring 3.6 x 3.7 cm  . Appendectomy    . Cholecystectomy    . Eye surgery    . Lumbar disc surgery  X 3  . Tonsillectomy    . Knee arthroscopy Right ?1980's  . Vaginal hysterectomy    . Dilation and curettage of uterus      "a few" (11/08/2013)  . Cataract extraction w/ intraocular lens  implant, bilateral Bilateral 2000's  . Anterior cervical decomp/discectomy fusion       Discharge Condition: Stable   Follow UP  Follow-up Information   Follow up with Londell MohPHARR,WALTER DAVIDSON, MD. Schedule an appointment as soon  as possible for a visit in 1 week.   Specialty:  Internal Medicine   Contact information:   [address] [place] [address] [phone]       Follow up with [facility].   Contact information:   [address]     [address] [place] [phone]        Discharge Instructions  and  Discharge Medications      Discharge Instructions   Discharge instructions    Complete by:  As directed   Follow with Primary MD Londell MohPHARR,WALTER DAVIDSON, MD in 7 days   Get CBC, CMP, checked 7 days by Primary MD and again as instructed by your Primary MD.     Activity: As tolerated with Full fall precautions use walker/cane & assistance as needed   Disposition Home with HHPT   Diet: Heart Healthy with feeding assistance and aspiration precautions if needed.  For Heart failure patients - Check your Weight same time everyday, if you gain over 2 pounds, or you develop in leg swelling, experience more shortness of breath or chest pain, call your Primary MD immediately. Follow Cardiac Low Salt Diet and 1.8 lit/day fluid restriction.   On your next visit with her primary care physician please Get Medicines reviewed and adjusted.  Please request your Prim.MD to go over all Hospital Tests and Procedure/Radiological results at the follow up, please get all Hospital records sent to your Prim MD by signing hospital release before you go home.   If you experience worsening of your admission symptoms, develop shortness of breath, life threatening emergency, suicidal or homicidal thoughts you must seek medical attention immediately by calling 911 or calling your MD immediately  if symptoms less severe.  You Must read complete instructions/literature along with all the possible adverse reactions/side effects for all the Medicines you take and that have been prescribed to you. Take any new Medicines after you have completely understood and accpet all the  possible adverse reactions/side effects.   Do not drive and provide baby sitting services if your were admitted for syncope or siezures until you have seen by Primary MD or a Neurologist and advised to do so again.  Do not drive when taking Pain medications.    Do not take more than prescribed Pain, Sleep and Anxiety Medications  Special Instructions: If you have  smoked or chewed Tobacco  in the last 2 yrs please stop smoking, stop any regular Alcohol  and or any Recreational drug use.  Wear Seat belts while driving.   Please note  You were cared for by a hospitalist during your hospital stay. If you have any questions about your discharge medications or the care you received while you were in the hospital after you are discharged, you can call the unit and asked to speak with the hospitalist on call if the hospitalist that took care of you is not available. Once you are discharged, your primary care physician will handle any further medical issues. Please note that NO REFILLS for any discharge medications will be authorized once you are discharged, as it is imperative that you return to your primary care physician (or establish a relationship with a primary care physician if you do not have one) for your aftercare needs so that they can reassess your need for medications and monitor your lab values.  Follow with Primary MD [name], MD in 7 days   Get CBC, CMP, checked 7 days by Primary MD and again as instructed by your Primary MD.     Activity: As tolerated with Full fall precautions use walker/cane & assistance as needed   Disposition Home with HHPT   Diet: Heart Healthy with feeding assistance and aspiration precautions if needed.  For Heart failure patients - Check your Weight same time everyday, if you gain over 2 pounds, or you develop in leg swelling, experience more shortness of breath or chest pain, call your Primary MD immediately. Follow Cardiac Low Salt Diet  and 1.8 lit/day fluid restriction.   On your next visit with her primary care physician please Get Medicines reviewed and adjusted.  Please request your Prim.MD to go over all Hospital Tests and Procedure/Radiological results at the follow up, please get all Hospital records sent to your Prim MD by signing hospital release before you go home.   If you experience worsening of your admission symptoms, develop shortness of breath, life threatening emergency, suicidal or homicidal thoughts you must seek medical attention immediately by calling 911 or calling your MD immediately  if symptoms less severe.  You Must read complete instructions/literature along with all the possible adverse reactions/side effects for all the Medicines you take and that have been prescribed to you. Take any new Medicines after you have completely understood and accpet all the possible adverse reactions/side effects.   Do not drive and provide baby sitting services if your were admitted for syncope or siezures until you have seen by Primary MD or a Neurologist and advised to do so again.  Do not drive when taking Pain medications.    Do not take more than prescribed Pain, Sleep and Anxiety Medications  Special Instructions: If you have smoked or chewed Tobacco  in the last 2 yrs please stop smoking, stop any regular Alcohol  and or any Recreational drug use.  Wear Seat belts while driving.   Please note  You were cared for by a hospitalist during your hospital stay. If you have any questions about your discharge medications or the care you received while you were in the hospital after you are discharged, you can call the unit and asked to speak with the hospitalist on call if the hospitalist that took care of you is not available. Once you are discharged, your primary care physician will handle any further medical issues. Please note that NO REFILLS for any discharge  medications will be authorized once you are  discharged, as it is imperative that you return to your primary care physician (or establish a relationship with a primary care physician if you do not have one) for your aftercare needs so that they can reassess your need for medications and monitor your lab values.     Increase activity slowly    Complete by:  As directed             Medication List         acetaminophen 500 MG tablet  Commonly known as:  TYLENOL  Take 1,000 mg by mouth every 6 (six) hours as needed for mild pain.     aspirin EC 325 MG tablet  Take 1 tablet (325 mg total) by mouth daily.     atorvastatin 10 MG tablet  Commonly known as:  LIPITOR  Take 1 tablet (10 mg total) by mouth daily at 6 PM.     Biotin 5000 MCG Caps  Take 5,000 mcg by mouth daily.     bisacodyl 10 MG suppository  Commonly known as:  DULCOLAX  Place 10 mg rectally daily as needed for mild constipation or moderate constipation.     cefUROXime 500 MG tablet  Commonly known as:  CEFTIN  Take 1 tablet (500 mg total) by mouth 2 (two) times daily with a meal.     docusate sodium 100 MG capsule  Commonly known as:  COLACE  Take 100 mg by mouth daily with supper.     fish oil-omega-3 fatty acids 1000 MG capsule  Take 2 g by mouth every morning.     furosemide 20 MG tablet  Commonly known as:  LASIX  Take 20-40 mg by mouth every morning.     gabapentin 400 MG capsule  Commonly known as:  NEURONTIN  Take 400 mg by mouth 3 (three) times daily.     levothyroxine 75 MCG tablet  Commonly known as:  SYNTHROID, LEVOTHROID  Take 1 tablet (75 mcg total) by mouth daily before breakfast.     metoprolol succinate 50 MG 24 hr tablet  Commonly known as:  TOPROL-XL  Take 25 mg by mouth 2 (two) times daily. Take with or immediately following a meal.     mirtazapine 7.5 MG tablet  Commonly known as:  REMERON  Take 7.5 mg by mouth at bedtime.     MYRBETRIQ 50 MG Tb24 tablet  Generic drug:  mirabegron ER  Take 50 mg by mouth every evening.      nitrofurantoin (macrocrystal-monohydrate) 100 MG capsule  Commonly known as:  MACROBID  Take 100 mg by mouth every evening.     oxyCODONE 10 MG 12 hr tablet  Commonly known as:  OXYCONTIN  Take 10 mg by mouth every 12 (twelve) hours.     potassium chloride 10 MEQ tablet  Commonly known as:  K-DUR,KLOR-CON  Take 10 mEq by mouth every other day.     trimethoprim 100 MG tablet  Commonly known as:  TRIMPEX  Take 100 mg by mouth 2 (two) times daily.     venlafaxine XR 75 MG 24 hr capsule  Commonly known as:  EFFEXOR-XR  Take 75 mg by mouth 2 (two) times daily.     Vitamin D (Ergocalciferol) 50000 UNITS Caps capsule  Commonly known as:  DRISDOL  Take 50,000 Units by mouth every 7 (seven) days. wednesdays     zaleplon 10 MG capsule  Commonly known as:  SONATA  Take 10  mg by mouth at bedtime.          Diet and Activity recommendation: See Discharge Instructions above   Consults obtained - Neuro   Major procedures and Radiology Reports - PLEASE review detailed and final reports for all details, in brief -    TTE  - Left ventricle: The cavity size was normal. There was mild focal basal hypertrophy of the septum. Systolic function was normal. The estimated ejection fraction was in the range of 60% to 65%. Wall motion was normal; there were no regional wall motion abnormalities. Doppler parameters are consistent with abnormal left ventricular relaxation (grade 1 diastolic dysfunction). - Aortic valve: Trileaflet; moderately thickened, mildly calcified leaflets. Mild regurgitation. - Aortic root: The aortic root was normal in size. - Mitral valve: No regurgitation. - Right ventricle: Systolic function was normal. - Pulmonary arteries: Systolic pressure was within the normal range. - Inferior vena cava: The vessel was normal in size. - Pericardium, extracardiac: There was no pericardial effusion. Impressions:  - No change since the echocardiogram on  11/09/2014.   Carotids  Preliminary report: 1-39% ICA stenosis. Mild plaque disease.      Dg Chest 2 View  04/19/2014   CLINICAL DATA:  Leg weakness and chest pain  EXAM: CHEST  2 VIEW  COMPARISON:  DG CHEST 2V dated 03/07/2014; DG CHEST 2 VIEW dated 11/08/2013; CT T SPINE W/CM dated 12/31/2010  FINDINGS: The heart size is normal. Prominent calcified aortic arch stable. Spinal stimulators noted.  No consolidation or effusion. Thoracolumbar compression deformity stable from 11/08/2013.  IMPRESSION: No active cardiopulmonary disease.   Electronically Signed   By: Esperanza Heir M.D.   On: 04/19/2014 16:12   Dg Hip Complete Right  04/20/2014   CLINICAL DATA:  Right hip pain without injury.  EXAM: RIGHT HIP - COMPLETE 2+ VIEW  COMPARISON:  None.  FINDINGS: Severe narrowing of the right hip joint is noted. No significant osteophyte formation is noted. There is no evidence of arthropathy or other focal bone abnormality.  IMPRESSION: Severe degenerative joint disease of the right hip. No acute fracture or dislocation is noted.   Electronically Signed   By: Roque Lias M.D.   On: 04/20/2014 09:06   Ct Head Wo Contrast  04/21/2014   CLINICAL DATA:  Decreased mentation. Evaluate for new or evolving cerebral vascular accident. Recent trauma.  EXAM: CT HEAD WITHOUT CONTRAST  TECHNIQUE: Contiguous axial images were obtained from the base of the skull through the vertex without intravenous contrast.  COMPARISON:  04/19/2014  FINDINGS: Skull and Sinuses:No evidence of fracture or osseous destruction. Clear paranasal sinuses.  Orbits: Bilateral cataract resection.  Brain: No evidence of acute abnormality, such as acute infarction, hemorrhage, hydrocephalus, or mass lesion/mass effect. The pattern of ischemic injury is stable from 11/09/2013. No evidence of recent infarct. Chronic small-vessel disease is extensive, with ischemic gliosis present throughout the deep bilateral cerebral white matter, with patchy  subcortical involvement. Remote small cortical infarct in the posterior right frontal lobe, at the vertex. Remote perforator infarct affecting the right caudate head and anterior limb internal capsule.  IMPRESSION: 1. No acute intracranial findings.  No evidence of recent infarct. 2. Advanced chronic small vessel disease.   Electronically Signed   By: Tiburcio Pea M.D.   On: 04/21/2014 09:52   Ct Head Wo Contrast  04/19/2014   CLINICAL DATA:  Weakness. Unable to ambulate. Abnormal mental status.  EXAM: CT HEAD WITHOUT CONTRAST  TECHNIQUE: Contiguous axial images were obtained from  the base of the skull through the vertex without intravenous contrast.  COMPARISON:  Head CT 11/09/2013.  FINDINGS: Mild cerebral atrophy. Patchy and confluent areas of decreased attenuation are noted throughout the deep and periventricular white matter of the cerebral hemispheres bilaterally, compatible with chronic microvascular ischemic disease. Several well-defined foci of low attenuation in the basal ganglia bilaterally are compatible with old lacunar infarcts, largest of which is in the head of the right caudate nucleus. No acute intracranial abnormalities. Specifically, no evidence of acute intracranial hemorrhage, no definite findings of acute/subacute cerebral ischemia, no mass, mass effect, hydrocephalus or abnormal intra or extra-axial fluid collections. Visualized paranasal sinuses and mastoids are well pneumatized. No acute displaced skull fractures are identified.  IMPRESSION: 1. No acute intracranial abnormalities. 2. Mild cerebral atrophy with extensive chronic microvascular ischemic changes in the cerebral white matter and old lacunar infarcts redemonstrated, as above.   Electronically Signed   By: Trudie Reedaniel  Entrikin M.D.   On: 04/19/2014 18:23   Dg Hand Complete Right  04/21/2014   CLINICAL DATA:  Right hand pain.  EXAM: RIGHT HAND - COMPLETE 3+ VIEW  COMPARISON:  None.  FINDINGS: Erosion at the medial fifth CMC  joint. There is extensive calcified soft tissue around the carpus, appearance favoring chondrocalcinosis over tophi. Degenerative or postinflammatory cystic change within the non collapsed lunate. Degenerative joint narrowing and sclerosis present at the base of the thumb and throughout the interphalangeal joints. Osteopenia. No acute fracture subluxation.  IMPRESSION: 1. Erosion at the fifth [facility] joint. Correlate for history of erosive arthritis, including gout. 2. Chondrocalcinosis and multi focal osteoarthritis, as above.   Electronically Signed   By: Tiburcio PeaJonathan  Watts M.D.   On: 04/21/2014 11:21    Micro Results      Recent Results (from the past 240 hour(s))  URINE CULTURE     Status: None   Collection Time    04/19/14  3:40 PM      Result Value Ref Range Status   Specimen Description URINE, CATHETERIZED   Final   Special Requests NONE   Final   Culture  Setup Time     Final   Value: 04/19/2014 18:53     [facility]formed at [facility]   Colony Count     Final   Value: NO GROWTH     [facility]formed at [facility]   Culture     Final   Value: NO GROWTH     Performed at [facility]   Report Status 04/21/2014 FINAL   Final     History of present illness and  Hospital Course:     Kindly see H&P for history of present illness and admission details, please review complete Labs, Consult reports and Test reports for all details in brief [place] CalixMaxine J Martha, is a 80 y.o. female, patient with history of dementia, CVA, paroxysmal afib, AAA, HTN, presenting with encephalopathy and ataxia. Daughter states that pt has been more confused over the past 2 days. Pt has also been complaining of inability to ambulate      1. Acute metabolic encephalopathy in a patient with dementia -  Most likely caused by acute renal failure plus possible mild UTI, head CT unremarkable eyes 2, able, echo and carotid on with A1c and lipid panel, Was seen by PT/OT and speech, neuro following. Mentation  somewhat improved per caregiver daughter bedside and close to baseline now. Continue aspirin. For generalized weakness will get home PT, plan discussed with Dr. they have  24-hour caregiver, have a walker and bedside commode at home.     2. Acute renal failure. Clinically appeared dehydrated, spotted well to gentle hydration repeat BMP in one week.     3. Dementia. Per husband not formally diagnosed, will place her on low-dose Aricept and have her follow with neurology outpatient.     4. Right hip pain chronic, chronic right hand pain. X-ray checked severe arthritis, supportive care.     5. Hypothyroidism. Continue home dose, stable TSH.   Lab Results  Component Value Date   TSH 2.380 04/20/2014      6. GERD. PPI .     7. History of paroxysmal atrial fibrillation along with SVT. On PO metoprolol , not on anticoagulation due to high fall risk.     8. Dyslipidemia. On statin continue.     9. Possible UTI. Was on Rocephin switched to Ceftin for few more days will request PCP to kindly follow final urine culture results which are pending.      Plan discussed with daughter. Will get home PT, has walker and bedside commode at home.      Today   Subjective:   Alichia Alridge today has no headache,no chest abdominal pain,no new weakness tingling or numbness, feels much better wants to go home today.    Objective:   Blood pressure 171/88, pulse 69, temperature 97.9 F (36.6 C), temperature source Oral, resp. rate 20, height 5\' 4"  (1.626 m), weight 64.547 kg (142 lb 4.8 oz), SpO2 98.00%.  No intake or output data in the 24 hours ending 04/21/14 1253  Exam Awake Alert, Oriented *3, No new F.N deficits, Normal affect [place].AT,PERRAL Supple Neck,No JVD, No cervical lymphadenopathy appriciated.  Symmetrical Chest wall movement, Good air movement bilaterally, CTAB RRR,No Gallops,Rubs or new Murmurs, No Parasternal Heave +ve B.Sounds, Abd Soft, Non tender, No  organomegaly appriciated, No rebound -guarding or rigidity. No Cyanosis, Clubbing or edema, No new Rash or bruise  Data Review   CBC w Diff:  Lab Results  Component Value Date   WBC 8.0 04/19/2014   HGB 12.9 04/19/2014   HCT 38.8 04/19/2014   PLT 203 04/19/2014   LYMPHOPCT 22 01/11/2014   MONOPCT 17* 01/11/2014   EOSPCT 5 01/11/2014   BASOPCT 1 01/11/2014    CMP:  Lab Results  Component Value Date   NA 145 04/21/2014   K 4.9 04/21/2014   CL 111 04/21/2014   CO2 23 04/21/2014   BUN 28* 04/21/2014   CREATININE 1.34* 04/21/2014   PROT 7.8 04/19/2014   ALBUMIN 4.1 04/19/2014   BILITOT 0.3 04/19/2014   ALKPHOS 93 04/19/2014   AST 19 04/19/2014   ALT 15 04/19/2014  .  Lab Results  Component Value Date   HGBA1C 5.7* 04/20/2014    Lab Results  Component Value Date   CHOL 132 04/20/2014   HDL 51 04/20/2014   LDLCALC 65 04/20/2014   TRIG 82 04/20/2014   CHOLHDL 2.6 04/20/2014    Total Time in preparing paper work, data evaluation and todays exam - 35 minutes  Leroy Sea M.D on 04/21/2014 at 12:53 PM  [facility] Office  [phone]

## 2014-04-21 NOTE — Progress Notes (Signed)
Utilization review complete 

## 2014-04-21 NOTE — Progress Notes (Signed)
Called to MRI to see if patient could go there from CT, [facility] patient has some type of implants that they are getting more information on prior to MRI for patient safety

## 2014-04-21 NOTE — Progress Notes (Signed)
OT Cancellation Note  Patient Details Name: Chelsea Lynch MRN: [id number] DOB: 11/11/1925   Cancelled Treatment:    Reason Eval/Treat Not Completed: Patient at procedure or test/ unavailable (CT). Will re-attempt later today as time allows.    04/21/2014 Grant RutsJenna E Ross Hefferan OTR/L Pager [phone] Office [phone]

## 2014-04-24 LAB — VITAMIN D 1,25 DIHYDROXY
VITAMIN D2 1, 25 (OH): 33 pg/mL
Vitamin D 1, 25 (OH)2 Total: 33 pg/mL (ref 18–72)
Vitamin D3 1, 25 (OH)2: <8 pg/mL

## 2014-04-26 LAB — CULTURE, BLOOD (ROUTINE X 2)
CULTURE: NO GROWTH
Culture: NO GROWTH

## 2014-05-10 ENCOUNTER — Ambulatory Visit: Payer: Medicare Other | Admitting: Neurology

## 2014-05-10 ENCOUNTER — Telehealth: Payer: Self-pay | Admitting: Neurology

## 2014-05-10 MED ORDER — DONEPEZIL HCL 5 MG PO TABS: 5.0000 mg | ORAL_TABLET | Freq: Every day | ORAL | Status: DC

## 2014-05-10 NOTE — Telephone Encounter (Signed)
Patient's daughter and caregiver Steward Drone) brought patient to the office today thinking they had an appointment but it is not until 6/30. Patient needs refills of Aricept - sent to [facility]. Please call to advise.

## 2014-05-10 NOTE — Telephone Encounter (Signed)
Rx has been sent.  Called back, got no answer.  

## 2014-05-15 ENCOUNTER — Telehealth: Payer: Self-pay | Admitting: Neurology

## 2014-05-15 ENCOUNTER — Encounter: Payer: Self-pay | Admitting: Neurology

## 2014-05-15 NOTE — Telephone Encounter (Signed)
Spoke to patient regarding rescheduling 06/05/14 appointment per Dr. Marlis Edelson schedule, moved to Dr. Roda Shutters, patient verbalized understanding. Printed and mailed letter with new appointment time.

## 2014-06-05 ENCOUNTER — Ambulatory Visit: Payer: Medicare Other | Admitting: Neurology

## 2014-06-19 ENCOUNTER — Other Ambulatory Visit: Payer: Self-pay | Admitting: Neurology

## 2014-06-22 ENCOUNTER — Ambulatory Visit: Payer: Medicare Other | Admitting: Neurology

## 2014-06-25 ENCOUNTER — Inpatient Hospital Stay (HOSPITAL_COMMUNITY)
Admission: EM | Admit: 2014-06-25 | Discharge: 2014-06-28 | DRG: 206 | Disposition: A | Discharge status: Home Health | Payer: Medicare Other | Attending: Internal Medicine | Admitting: Internal Medicine

## 2014-06-25 ENCOUNTER — Encounter (HOSPITAL_COMMUNITY): Payer: Self-pay | Admitting: Emergency Medicine

## 2014-06-25 ENCOUNTER — Emergency Department (HOSPITAL_COMMUNITY): Payer: Medicare Other

## 2014-06-25 DIAGNOSIS — I359 Nonrheumatic aortic valve disorder, unspecified: Secondary | ICD-10-CM | POA: Diagnosis present

## 2014-06-25 DIAGNOSIS — Z79899 Other long term (current) drug therapy: Secondary | ICD-10-CM

## 2014-06-25 DIAGNOSIS — R296 Repeated falls: Secondary | ICD-10-CM

## 2014-06-25 DIAGNOSIS — M549 Dorsalgia, unspecified: Secondary | ICD-10-CM

## 2014-06-25 DIAGNOSIS — G894 Chronic pain syndrome: Secondary | ICD-10-CM

## 2014-06-25 DIAGNOSIS — A419 Sepsis, unspecified organism: Secondary | ICD-10-CM

## 2014-06-25 DIAGNOSIS — Z9181 History of falling: Secondary | ICD-10-CM

## 2014-06-25 DIAGNOSIS — K5730 Diverticulosis of large intestine without perforation or abscess without bleeding: Secondary | ICD-10-CM

## 2014-06-25 DIAGNOSIS — G629 Polyneuropathy, unspecified: Secondary | ICD-10-CM

## 2014-06-25 DIAGNOSIS — Z9849 Cataract extraction status, unspecified eye: Secondary | ICD-10-CM

## 2014-06-25 DIAGNOSIS — Z880 Allergy status to penicillin: Secondary | ICD-10-CM

## 2014-06-25 DIAGNOSIS — R5383 Other fatigue: Secondary | ICD-10-CM

## 2014-06-25 DIAGNOSIS — G8194 Hemiplegia, unspecified affecting left nondominant side: Secondary | ICD-10-CM

## 2014-06-25 DIAGNOSIS — F1123 Opioid dependence with withdrawal: Secondary | ICD-10-CM

## 2014-06-25 DIAGNOSIS — R5381 Other malaise: Secondary | ICD-10-CM | POA: Diagnosis not present

## 2014-06-25 DIAGNOSIS — G4700 Insomnia, unspecified: Secondary | ICD-10-CM | POA: Diagnosis present

## 2014-06-25 DIAGNOSIS — K5901 Slow transit constipation: Secondary | ICD-10-CM

## 2014-06-25 DIAGNOSIS — G459 Transient cerebral ischemic attack, unspecified: Secondary | ICD-10-CM

## 2014-06-25 DIAGNOSIS — R932 Abnormal findings on diagnostic imaging of liver and biliary tract: Secondary | ICD-10-CM

## 2014-06-25 DIAGNOSIS — I639 Cerebral infarction, unspecified: Secondary | ICD-10-CM

## 2014-06-25 DIAGNOSIS — Z961 Presence of intraocular lens: Secondary | ICD-10-CM

## 2014-06-25 DIAGNOSIS — N189 Chronic kidney disease, unspecified: Secondary | ICD-10-CM

## 2014-06-25 DIAGNOSIS — K2900 Acute gastritis without bleeding: Secondary | ICD-10-CM

## 2014-06-25 DIAGNOSIS — Z888 Allergy status to other drugs, medicaments and biological substances status: Secondary | ICD-10-CM

## 2014-06-25 DIAGNOSIS — W19XXXA Unspecified fall, initial encounter: Secondary | ICD-10-CM | POA: Diagnosis present

## 2014-06-25 DIAGNOSIS — I4891 Unspecified atrial fibrillation: Secondary | ICD-10-CM | POA: Diagnosis present

## 2014-06-25 DIAGNOSIS — N12 Tubulo-interstitial nephritis, not specified as acute or chronic: Secondary | ICD-10-CM

## 2014-06-25 DIAGNOSIS — F329 Major depressive disorder, single episode, unspecified: Secondary | ICD-10-CM | POA: Diagnosis present

## 2014-06-25 DIAGNOSIS — Z823 Family history of stroke: Secondary | ICD-10-CM

## 2014-06-25 DIAGNOSIS — S2239XA Fracture of one rib, unspecified side, initial encounter for closed fracture: Secondary | ICD-10-CM

## 2014-06-25 DIAGNOSIS — R778 Other specified abnormalities of plasma proteins: Secondary | ICD-10-CM

## 2014-06-25 DIAGNOSIS — R7989 Other specified abnormal findings of blood chemistry: Secondary | ICD-10-CM

## 2014-06-25 DIAGNOSIS — R531 Weakness: Secondary | ICD-10-CM

## 2014-06-25 DIAGNOSIS — F0391 Unspecified dementia with behavioral disturbance: Secondary | ICD-10-CM

## 2014-06-25 DIAGNOSIS — Z8249 Family history of ischemic heart disease and other diseases of the circulatory system: Secondary | ICD-10-CM

## 2014-06-25 DIAGNOSIS — R7881 Bacteremia: Secondary | ICD-10-CM

## 2014-06-25 DIAGNOSIS — S2249XA Multiple fractures of ribs, unspecified side, initial encounter for closed fracture: Principal | ICD-10-CM | POA: Diagnosis present

## 2014-06-25 DIAGNOSIS — F411 Generalized anxiety disorder: Secondary | ICD-10-CM | POA: Diagnosis present

## 2014-06-25 DIAGNOSIS — I2789 Other specified pulmonary heart diseases: Secondary | ICD-10-CM | POA: Diagnosis present

## 2014-06-25 DIAGNOSIS — Z9089 Acquired absence of other organs: Secondary | ICD-10-CM

## 2014-06-25 DIAGNOSIS — Z7902 Long term (current) use of antithrombotics/antiplatelets: Secondary | ICD-10-CM

## 2014-06-25 DIAGNOSIS — F3289 Other specified depressive episodes: Secondary | ICD-10-CM | POA: Diagnosis present

## 2014-06-25 DIAGNOSIS — I480 Paroxysmal atrial fibrillation: Secondary | ICD-10-CM

## 2014-06-25 DIAGNOSIS — I471 Supraventricular tachycardia, unspecified: Secondary | ICD-10-CM

## 2014-06-25 DIAGNOSIS — G9340 Encephalopathy, unspecified: Secondary | ICD-10-CM

## 2014-06-25 DIAGNOSIS — G8190 Hemiplegia, unspecified affecting unspecified side: Secondary | ICD-10-CM | POA: Diagnosis present

## 2014-06-25 DIAGNOSIS — Z8673 Personal history of transient ischemic attack (TIA), and cerebral infarction without residual deficits: Secondary | ICD-10-CM

## 2014-06-25 DIAGNOSIS — F03918 Unspecified dementia, unspecified severity, with other behavioral disturbance: Secondary | ICD-10-CM

## 2014-06-25 DIAGNOSIS — J189 Pneumonia, unspecified organism: Secondary | ICD-10-CM

## 2014-06-25 DIAGNOSIS — F0390 Unspecified dementia without behavioral disturbance: Secondary | ICD-10-CM

## 2014-06-25 DIAGNOSIS — K219 Gastro-esophageal reflux disease without esophagitis: Secondary | ICD-10-CM | POA: Diagnosis present

## 2014-06-25 DIAGNOSIS — Z808 Family history of malignant neoplasm of other organs or systems: Secondary | ICD-10-CM

## 2014-06-25 DIAGNOSIS — Z801 Family history of malignant neoplasm of trachea, bronchus and lung: Secondary | ICD-10-CM

## 2014-06-25 DIAGNOSIS — E039 Hypothyroidism, unspecified: Secondary | ICD-10-CM

## 2014-06-25 DIAGNOSIS — N289 Disorder of kidney and ureter, unspecified: Secondary | ICD-10-CM

## 2014-06-25 DIAGNOSIS — Z881 Allergy status to other antibiotic agents status: Secondary | ICD-10-CM

## 2014-06-25 DIAGNOSIS — K5909 Other constipation: Secondary | ICD-10-CM

## 2014-06-25 DIAGNOSIS — G4730 Sleep apnea, unspecified: Secondary | ICD-10-CM

## 2014-06-25 DIAGNOSIS — S2232XA Fracture of one rib, left side, initial encounter for closed fracture: Secondary | ICD-10-CM

## 2014-06-25 DIAGNOSIS — R9389 Abnormal findings on diagnostic imaging of other specified body structures: Secondary | ICD-10-CM

## 2014-06-25 DIAGNOSIS — R6521 Severe sepsis with septic shock: Secondary | ICD-10-CM

## 2014-06-25 DIAGNOSIS — K5900 Constipation, unspecified: Secondary | ICD-10-CM | POA: Diagnosis present

## 2014-06-25 DIAGNOSIS — I10 Essential (primary) hypertension: Secondary | ICD-10-CM | POA: Diagnosis present

## 2014-06-25 DIAGNOSIS — R079 Chest pain, unspecified: Secondary | ICD-10-CM

## 2014-06-25 DIAGNOSIS — F1193 Opioid use, unspecified with withdrawal: Secondary | ICD-10-CM

## 2014-06-25 DIAGNOSIS — R1310 Dysphagia, unspecified: Secondary | ICD-10-CM

## 2014-06-25 DIAGNOSIS — Z87891 Personal history of nicotine dependence: Secondary | ICD-10-CM

## 2014-06-25 DIAGNOSIS — D649 Anemia, unspecified: Secondary | ICD-10-CM

## 2014-06-25 DIAGNOSIS — N9489 Other specified conditions associated with female genital organs and menstrual cycle: Secondary | ICD-10-CM

## 2014-06-25 LAB — COMPREHENSIVE METABOLIC PANEL
ALT: 12 U/L (ref 0–35)
AST: 16 U/L (ref 0–37)
Albumin: 3.9 g/dL (ref 3.5–5.2)
Alkaline Phosphatase: 95 U/L (ref 39–117)
Anion gap: 13 (ref 5–15)
BILIRUBIN TOTAL: 0.4 mg/dL (ref 0.3–1.2)
BUN: 24 mg/dL — AB (ref 6–23)
CHLORIDE: 102 meq/L (ref 96–112)
CO2: 27 mEq/L (ref 19–32)
Calcium: 9.4 mg/dL (ref 8.4–10.5)
Creatinine, Ser: 1.13 mg/dL — ABNORMAL HIGH (ref 0.50–1.10)
GFR calc Af Amer: 49 mL/min — ABNORMAL LOW (ref >90)
GFR calc non Af Amer: 42 mL/min — ABNORMAL LOW (ref >90)
Glucose, Bld: 88 mg/dL (ref 70–99)
Potassium: 4.2 mEq/L (ref 3.7–5.3)
Sodium: 142 mEq/L (ref 137–147)
Total Protein: 7.2 g/dL (ref 6.0–8.3)

## 2014-06-25 LAB — URINALYSIS, ROUTINE W REFLEX MICROSCOPIC
Bilirubin Urine: NEGATIVE
Glucose, UA: NEGATIVE mg/dL
Hgb urine dipstick: NEGATIVE
KETONES UR: NEGATIVE mg/dL
Leukocytes, UA: NEGATIVE
NITRITE: NEGATIVE
PH: 7.5 (ref 5.0–8.0)
PROTEIN: NEGATIVE mg/dL
Specific Gravity, Urine: 1.016 (ref 1.005–1.030)
Urobilinogen, UA: 0.2 mg/dL (ref 0.0–1.0)

## 2014-06-25 LAB — CBC WITH DIFFERENTIAL/PLATELET
Basophils Absolute: 0 10*3/uL (ref 0.0–0.1)
Basophils Relative: 1 % (ref 0–1)
EOS ABS: 0.3 10*3/uL (ref 0.0–0.7)
EOS PCT: 4 % (ref 0–5)
HEMATOCRIT: 40.8 % (ref 36.0–46.0)
HEMOGLOBIN: 13.3 g/dL (ref 12.0–15.0)
Lymphocytes Relative: 19 % (ref 12–46)
Lymphs Abs: 1.3 10*3/uL (ref 0.7–4.0)
MCH: 30.1 pg (ref 26.0–34.0)
MCHC: 32.6 g/dL (ref 30.0–36.0)
MCV: 92.3 fL (ref 78.0–100.0)
MONOS PCT: 12 % (ref 3–12)
Monocytes Absolute: 0.8 10*3/uL (ref 0.1–1.0)
Neutro Abs: 4.4 10*3/uL (ref 1.7–7.7)
Neutrophils Relative %: 64 % (ref 43–77)
Platelets: 205 10*3/uL (ref 150–400)
RBC: 4.42 MIL/uL (ref 3.87–5.11)
RDW: 14 % (ref 11.5–15.5)
WBC: 6.8 10*3/uL (ref 4.0–10.5)

## 2014-06-25 LAB — AMMONIA: Ammonia: 18 umol/L (ref 11–60)

## 2014-06-25 LAB — I-STAT TROPONIN, ED: Troponin i, poc: 0 ng/mL (ref 0.00–0.08)

## 2014-06-25 LAB — CK: CK TOTAL: 34 U/L (ref 7–177)

## 2014-06-25 MED ORDER — DONEPEZIL HCL 5 MG PO TABS
5.0000 mg | ORAL_TABLET | Freq: Every day | ORAL | Status: DC
  Administered 2014-06-25 – 2014-06-27 (×3): 5 mg via ORAL
  Filled 2014-06-25 (×6): qty 1

## 2014-06-25 MED ORDER — DOCUSATE SODIUM 100 MG PO CAPS
200.0000 mg | ORAL_CAPSULE | Freq: Every evening | ORAL | Status: DC
  Administered 2014-06-25 – 2014-06-28 (×4): 200 mg via ORAL
  Filled 2014-06-25 (×6): qty 2

## 2014-06-25 MED ORDER — METOPROLOL SUCCINATE ER 25 MG PO TB24
25.0000 mg | ORAL_TABLET | Freq: Two times a day (BID) | ORAL | Status: DC
  Administered 2014-06-25 – 2014-06-28 (×6): 25 mg via ORAL
  Filled 2014-06-25 (×10): qty 1

## 2014-06-25 MED ORDER — ENOXAPARIN SODIUM 40 MG/0.4ML ~~LOC~~ SOLN
40.0000 mg | SUBCUTANEOUS | Status: DC
  Administered 2014-06-25 – 2014-06-27 (×3): 40 mg via SUBCUTANEOUS
  Filled 2014-06-25 (×5): qty 0.4

## 2014-06-25 MED ORDER — VENLAFAXINE HCL ER 75 MG PO CP24
75.0000 mg | ORAL_CAPSULE | Freq: Two times a day (BID) | ORAL | Status: DC
  Administered 2014-06-25 – 2014-06-28 (×6): 75 mg via ORAL
  Filled 2014-06-25 (×9): qty 1

## 2014-06-25 MED ORDER — ONDANSETRON HCL 4 MG/2ML IJ SOLN
4.0000 mg | Freq: Four times a day (QID) | INTRAMUSCULAR | Status: DC | PRN
  Administered 2014-06-28: 4 mg via INTRAVENOUS
  Filled 2014-06-25: qty 2

## 2014-06-25 MED ORDER — OMEGA-3 FATTY ACIDS 1000 MG PO CAPS
2.0000 g | ORAL_CAPSULE | Freq: Every morning | ORAL | Status: DC
  Administered 2014-06-26 – 2014-06-28 (×2): 2 g via ORAL
  Filled 2014-06-25 (×4): qty 2

## 2014-06-25 MED ORDER — ACETAMINOPHEN 500 MG PO TABS
1000.0000 mg | ORAL_TABLET | Freq: Four times a day (QID) | ORAL | Status: DC | PRN
  Administered 2014-06-26 – 2014-06-28 (×4): 1000 mg via ORAL
  Filled 2014-06-25 (×4): qty 2

## 2014-06-25 MED ORDER — HYDROCODONE-ACETAMINOPHEN 5-325 MG PO TABS: 1.0000 | ORAL_TABLET | Freq: Four times a day (QID) | ORAL | Status: DC | PRN

## 2014-06-25 MED ORDER — SODIUM CHLORIDE 0.9 % IV SOLN
INTRAVENOUS | Status: DC
  Administered 2014-06-25 – 2014-06-26 (×2): via INTRAVENOUS

## 2014-06-25 MED ORDER — ONDANSETRON HCL 4 MG PO TABS: 4.0000 mg | ORAL_TABLET | Freq: Four times a day (QID) | ORAL | Status: DC | PRN

## 2014-06-25 MED ORDER — OXYCODONE HCL 5 MG PO TABS
10.0000 mg | ORAL_TABLET | Freq: Once | ORAL | Status: AC
  Administered 2014-06-25: 10 mg via ORAL
  Filled 2014-06-25: qty 2

## 2014-06-25 MED ORDER — BISACODYL 10 MG RE SUPP: 10.0000 mg | Freq: Every day | RECTAL | Status: DC | PRN

## 2014-06-25 MED ORDER — LEVOTHYROXINE SODIUM 75 MCG PO TABS
75.0000 ug | ORAL_TABLET | Freq: Every day | ORAL | Status: DC
  Administered 2014-06-26 – 2014-06-28 (×3): 75 ug via ORAL
  Filled 2014-06-25 (×4): qty 1

## 2014-06-25 MED ORDER — MIRTAZAPINE 7.5 MG PO TABS
7.5000 mg | ORAL_TABLET | Freq: Every evening | ORAL | Status: DC
  Administered 2014-06-25 – 2014-06-28 (×4): 7.5 mg via ORAL
  Filled 2014-06-25 (×5): qty 1

## 2014-06-25 MED ORDER — ATORVASTATIN CALCIUM 10 MG PO TABS
10.0000 mg | ORAL_TABLET | Freq: Every day | ORAL | Status: DC
  Administered 2014-06-25 – 2014-06-28 (×4): 10 mg via ORAL
  Filled 2014-06-25 (×4): qty 1

## 2014-06-25 MED ORDER — LIDOCAINE 5 % EX PTCH
1.0000 | MEDICATED_PATCH | Freq: Every day | CUTANEOUS | Status: DC | PRN
  Administered 2014-06-27: 1 via TRANSDERMAL
  Filled 2014-06-25 (×2): qty 1

## 2014-06-25 MED ORDER — GABAPENTIN 400 MG PO CAPS
400.0000 mg | ORAL_CAPSULE | Freq: Every day | ORAL | Status: DC
  Administered 2014-06-25 – 2014-06-27 (×3): 400 mg via ORAL
  Filled 2014-06-25 (×6): qty 1

## 2014-06-25 MED ORDER — NITROFURANTOIN MONOHYD MACRO 100 MG PO CAPS
100.0000 mg | ORAL_CAPSULE | Freq: Every evening | ORAL | Status: DC
  Administered 2014-06-25 – 2014-06-28 (×4): 100 mg via ORAL
  Filled 2014-06-25 (×4): qty 1

## 2014-06-25 MED ORDER — SODIUM CHLORIDE 0.9 % IJ SOLN: 3.0000 mL | Freq: Two times a day (BID) | INTRAMUSCULAR | Status: DC

## 2014-06-25 MED ORDER — OXYCODONE HCL ER 10 MG PO T12A
10.0000 mg | EXTENDED_RELEASE_TABLET | Freq: Two times a day (BID) | ORAL | Status: DC
  Administered 2014-06-25 – 2014-06-26 (×2): 10 mg via ORAL
  Filled 2014-06-25 (×4): qty 1

## 2014-06-25 MED ORDER — ASPIRIN 325 MG PO TABS
325.0000 mg | ORAL_TABLET | Freq: Every day | ORAL | Status: DC
  Administered 2014-06-25: 325 mg via ORAL
  Filled 2014-06-25 (×2): qty 1

## 2014-06-25 NOTE — ED Provider Notes (Signed)
CSN: [id number]     Arrival date & time 06/25/14  1229 History   First MD Initiated Contact with Patient 06/25/14 1236     Chief Complaint  Patient presents with  . Fall     (Consider location/radiation/quality/duration/timing/severity/associated sxs/prior Treatment) HPI Comments: Patient with history of CVA affecting left side of body, deficits improving with physical therapy at home, ambulatory with walker at baseline -- presents with complaint of fall and rib pain as well as worsening left-sided weakness of the past one week. Family states that the patient has been unable to ambulate at baseline for the past week. She has physical therapy at home and patient has been unable to do exercises that she previously could with her left side. Family has noted mild left-sided facial droop at times. 2 days ago the patient had a fall against a metal bar that she uses to pull herself up from bed. She impacted her left anterior ribs and continues to have pain in this area, worse with palpation and deep breathing. She does not have any shortness of breath or hemoptysis. She is on chronic Percocet but this has not helped her pain. Daughter states the patient was not able to get herself out of bed this morning. They are concerned about a new stroke or urinary tract infection.  The history is provided by the patient, medical records, a relative and a caregiver.    Past Medical History  Diagnosis Date  . GERD (gastroesophageal reflux disease)   . Depression   . Neuropathy   . Constipation, chronic   . Insomnia   . Chronic pain   . Hypothyroid   . Anxiety   . Frequent falls     "not much in the last few months" (11/08/2013)  . PAF (paroxysmal atrial fibrillation)     not on anticoagulation due to a fall risk, wore an Event monitor 02//23/11-03/03/11  . PAT (paroxysmal atrial tachycardia)     Supraventricular tachycardia; most recent episode associated with sepsis  . Hypertension   . AAA (abdominal  aortic aneurysm) without rupture     Followed by routine Dopplers  . Spinal stenosis   . Complication of anesthesia     "it's hard for me to wakeup" (11/08/2013)  . Pneumonia     "several years ago" (11/08/2013)  . Chronic bronchitis     "hasn't had it in quite a few years" (11/08/2013)  . Shortness of breath     "can come about at anytime" (11/08/2013)  . Sleep apnea     "doesn't wear a mask; RX'd but wouldn't get one" (11/08/2013)  . Headache(784.0)     "at least weekly; sometimes daily" (11/08/2013)  . Stroke ?1980's    denies residual on 11/08/2013  . DDD (degenerative disc disease)     "all of her spine" (11/08/2013)  . Arthritis     "joints; back; hands are getting pretty bad" (11/08/2013)  . Chronic lower back pain   . Chronic leg pain     "nerve pain" (11/08/2013)  . Dementia     "confusion comes and goes; mostly situational; does not have good short term memory but won't admit it" (11/08/2013)   Past Surgical History  Procedure Laterality Date  . Nm myocar perf wall motion  June 2014    No ischemia or infarction, mild apical breast attenuation, EF roughly 70%  . Doppler echocardiography  03/2013    Normal EF 66 5%. Mild/moderate aortic regurgitation, mildly dilated RV with moderate pulmonary hypertension  .  Abdomnal aorta  02/25/2012    mid abd. aorta  mid segment diltaton 3.93 x 4.55 cm greatest diameter.mild amt atherosclerosis without evidence of sigificant diamter reduction  . Lower arterial extremities doppler  02/19/2011    right abi 0.98,left 0.97  . Renal doppler  02/12/2011    abd aorta prox 2.6 x 3.1 cm,mid 4.1 x 3.6 cm, distal 2.3 x 3.4 cm, right renal artery 1-59% left renal artery norm. ,both renal size normal  . Renal doppler  02/11/2010    infrarenal AAA mearsuring 3.6 x 3.7 cm  . Appendectomy    . Cholecystectomy    . Eye surgery    . Lumbar disc surgery  X 3  . Tonsillectomy    . Knee arthroscopy Right ?1980's  . Vaginal hysterectomy    . Dilation and  curettage of uterus      "a few" (11/08/2013)  . Cataract extraction w/ intraocular lens  implant, bilateral Bilateral 2000's  . Anterior cervical decomp/discectomy fusion     Family History  Problem Relation Age of Onset  . Stroke Mother   . Heart attack Father   . Lung cancer Sister   . Bone cancer Sister   . High blood pressure    . Heart disease     History  Substance Use Topics  . Smoking status: Former Smoker -- 0.50 packs/day for 10 years    Types: Cigarettes  . Smokeless tobacco: Never Used  . Alcohol Use: Yes     Comment: 11/08/2013 "used to drink some; never had problem w/it; haven't had a drink in years"   OB History   Grav Para Term Preterm Abortions TAB SAB Ect Mult Living                 Review of Systems  Constitutional: Negative for fever.  HENT: Negative for rhinorrhea and sore throat.   Eyes: Negative for redness.  Respiratory: Negative for cough.   Cardiovascular: Positive for chest pain (rib pain after fall).  Gastrointestinal: Negative for nausea, vomiting, abdominal pain and diarrhea.  Genitourinary: Negative for dysuria.  Musculoskeletal: Positive for gait problem. Negative for myalgias.  Skin: Negative for rash.  Neurological: Positive for facial asymmetry and weakness. Negative for headaches.    Allergies  Clarithromycin; Codeine; Tequin; and Penicillins  Home Medications   Prior to Admission medications   Medication Sig Start Date End Date Taking? Authorizing Provider  acetaminophen (TYLENOL) 500 MG tablet Take 1,000 mg by mouth every 6 (six) hours as needed for mild pain.    Historical Provider, MD  aspirin EC 325 MG tablet Take 1 tablet (325 mg total) by mouth daily. 11/10/13   Esperanza SheetsUlugbek N Buriev, MD  atorvastatin (LIPITOR) 10 MG tablet Take 1 tablet (10 mg total) by mouth daily at 6 PM. 11/10/13   Esperanza SheetsUlugbek N Buriev, MD  Biotin 5000 MCG CAPS Take 5,000 mcg by mouth daily.    Historical Provider, MD  bisacodyl (DULCOLAX) 10 MG suppository Place  10 mg rectally daily as needed for mild constipation or moderate constipation.    Historical Provider, MD  cefUROXime (CEFTIN) 500 MG tablet Take 1 tablet (500 mg total) by mouth 2 (two) times daily with a meal. 04/21/14   Leroy SeaPrashant K Singh, MD  docusate sodium (COLACE) 100 MG capsule Take 100 mg by mouth daily with supper.    Historical Provider, MD  donepezil (ARICEPT) 5 MG tablet TAKE (1) TABLET DAILY AT BEDTIME. 06/19/14   Delia HeadyPramod Sethi, MD  fish oil-omega-3  fatty acids 1000 MG capsule Take 2 g by mouth every morning.     Historical Provider, MD  furosemide (LASIX) 20 MG tablet Take 20-40 mg by mouth every morning.     Historical Provider, MD  gabapentin (NEURONTIN) 400 MG capsule Take 400 mg by mouth 3 (three) times daily.    Historical Provider, MD  levothyroxine (SYNTHROID, LEVOTHROID) 75 MCG tablet Take 1 tablet (75 mcg total) by mouth daily before breakfast. 04/06/13   Joseph Art, DO  metoprolol succinate (TOPROL-XL) 50 MG 24 hr tablet Take 25 mg by mouth 2 (two) times daily. Take with or immediately following a meal. 04/06/13   Joseph Art, DO  mirabegron ER (MYRBETRIQ) 50 MG TB24 Take 50 mg by mouth every evening.     Historical Provider, MD  mirtazapine (REMERON) 7.5 MG tablet Take 7.5 mg by mouth at bedtime.    Historical Provider, MD  nitrofurantoin, macrocrystal-monohydrate, (MACROBID) 100 MG capsule Take 100 mg by mouth every evening.    Historical Provider, MD  oxyCODONE (OXYCONTIN) 10 MG 12 hr tablet Take 10 mg by mouth every 12 (twelve) hours.    Historical Provider, MD  potassium chloride (K-DUR,KLOR-CON) 10 MEQ tablet Take 10 mEq by mouth every other day.    Historical Provider, MD  trimethoprim (TRIMPEX) 100 MG tablet Take 100 mg by mouth 2 (two) times daily.    Historical Provider, MD  venlafaxine XR (EFFEXOR-XR) 75 MG 24 hr capsule Take 75 mg by mouth 2 (two) times daily.    Historical Provider, MD  Vitamin D, Ergocalciferol, (DRISDOL) 50000 UNITS CAPS Take 50,000 Units by  mouth every 7 (seven) days. wednesdays    Historical Provider, MD  zaleplon (SONATA) 10 MG capsule Take 10 mg by mouth at bedtime.    Historical Provider, MD   BP 175/69  Pulse 63  Temp(Src) 97.7 F (36.5 C) (Oral)  Resp 16  SpO2 92%  Physical Exam  Nursing note and vitals reviewed. Constitutional: She is oriented to person, place, and time. She appears well-developed and well-nourished.  HENT:  Head: Normocephalic and atraumatic.  Right Ear: Tympanic membrane, external ear and ear canal normal.  Left Ear: Tympanic membrane, external ear and ear canal normal.  Nose: Nose normal.  Mouth/Throat: Uvula is midline, oropharynx is clear and moist and mucous membranes are normal.  Eyes: Conjunctivae, EOM and lids are normal. Pupils are equal, round, and reactive to light. Right eye exhibits no nystagmus. Left eye exhibits no nystagmus.  Neck: Normal range of motion. Neck supple.  Cardiovascular: Normal rate and regular rhythm.   Pulmonary/Chest: Effort normal and breath sounds normal.    Abdominal: Soft. There is no tenderness.  Musculoskeletal: She exhibits no edema and no tenderness.       Cervical back: She exhibits normal range of motion, no tenderness and no bony tenderness.  Neurological: She is alert and oriented to person, place, and time. She has normal reflexes. No cranial nerve deficit or sensory deficit. Coordination normal. GCS eye subscore is 4. GCS verbal subscore is 5. GCS motor subscore is 6.  Trace weakness L upper and lower extremity, flexors and extensors  Skin: Skin is warm and dry.  Psychiatric: She has a normal mood and affect.    ED Course  Procedures (including critical care time) Labs Review Labs Reviewed  COMPREHENSIVE METABOLIC PANEL - Abnormal; Notable for the following:    BUN 24 (*)    Creatinine, Ser 1.13 (*)    GFR calc non  Af Amer 42 (*)    GFR calc Af Amer 49 (*)    All other components within normal limits  URINALYSIS, ROUTINE W REFLEX  MICROSCOPIC - Abnormal; Notable for the following:    APPearance CLOUDY (*)    All other components within normal limits  CBC WITH DIFFERENTIAL  I-STAT TROPOININ, ED    Imaging Review Dg Ribs Unilateral W/chest Left  06/25/2014   CLINICAL DATA:  History of fall with injury to the left anterior ribs.  EXAM: LEFT RIBS AND CHEST - 3+ VIEW  COMPARISON:  Chest x-ray 04/19/2014.  FINDINGS: Lung volumes are low. No consolidative airspace disease. No pneumothorax. Probable trace left pleural effusion. No evidence of pulmonary edema. Heart size is normal. Probable aneurysmal dilatation of the thoracic aorta estimated to measure approximately 5 cm at the level of the aortic arch, similar to recent prior studies. Heart size is normal. Spinal cord stimulator in position.  Dedicated views of the left ribs demonstrate 2 subtle nondisplaced fractures of the lateral aspects of the left fifth and sixth ribs. No other acute displaced fractures are noted.  IMPRESSION: 1. Nondisplaced lateral left fifth and sixth rib fractures. No associated pneumothorax. 2. Low lung volumes with trace left pleural effusion. 3. Atherosclerosis with probable aneurysmal dilatation of the ascending thoracic aorta, unchanged compared to prior examinations.   Electronically Signed   By: Trudie Reed M.D.   On: 06/25/2014 14:01   Ct Head Wo Contrast  06/25/2014   CLINICAL DATA:  Fall.  EXAM: CT HEAD WITHOUT CONTRAST  TECHNIQUE: Contiguous axial images were obtained from the base of the skull through the vertex without intravenous contrast.  COMPARISON:  04/21/2014  FINDINGS: Old lacunar infarct is noted in the right basal ganglia, and an old, small cortical infarct is again seen in the posterior right parietal lobe. Confluent hypodensities in the subcortical and periventricular white matter bilaterally are similar to the prior study and compatible with advanced chronic small vessel ischemic disease. There is moderate cerebral atrophy. There  is no evidence of acute cortical infarct, intracranial hemorrhage, mass, midline shift, or extra-axial fluid collection.  Bilateral ocular lens implants are noted. Mastoid air cells and paranasal sinuses are clear.  IMPRESSION: 1. No evidence of acute intracranial abnormality. 2. Unchanged chronic small vessel ischemic disease.   Electronically Signed   By: Sebastian Ache   On: 06/25/2014 13:51     EKG Interpretation None      1:07 PM Patient seen and examined. Work-up initiated. Medications ordered.   Vital signs reviewed and are as follows: Filed Vitals:   06/25/14 1300  BP: 175/69  Pulse: 63  Temp:   Resp: 16   3:25 PM Patient discussed with Dr. Clarene Duke. Results discussed with patient and family at bedside. Will admit for rib fractures, stroke work-up. They are in agreement.   Blood for istat troponin was not delivered to mini lab. Blood is being obtained.    Date: 06/25/2014  Rate: 63  Rhythm: normal sinus rhythm  QRS Axis: normal  Intervals: PR prolonged  ST/T Wave abnormalities: normal  Conduction Disutrbances:none  Narrative Interpretation:   Old EKG Reviewed: unchanged from 04/19/14  3:41 PM Spoke with Dr. Lendell Caprice who will see.   MDM   Final diagnoses:  Rib fracture, left, closed, initial encounter  Weakness   Admit for new weakness in a patient who has subsequently fallen and broken her ribs.     Renne Crigler, PA-C 06/25/14 1542

## 2014-06-25 NOTE — ED Notes (Signed)
Pt reports to the ED via [facility] from home for eval of left sided flank and rib cage pain following a fall that occurred over the weekend. Pt reports she was getting up from her bed and fell and struck her left rib cage on a trapeze bar. Pt denies any head injury or LOC. Pt reports pain is worse with deep inspiration and movement. Pt has hx of TIAs and family is concerned she may have had one over the weekend because she had some left sided weakness. Grips equal. No obvious swelling, ecchymosis, or deformity noted to the left ribcage. Pt A&Ox4, resp e/u, and skin warm and dry.

## 2014-06-25 NOTE — H&P (Signed)
[facility] History and Physical  Chelsea CalixMaxine J Aries [id number] DOB: June 28, 1925 DOA: 06/25/2014  Referring physician: EDP PCP: Londell MohPHARR,WALTER DAVIDSON, MD   Chief Complaint: weakness, left chest wall pain  HPI: Chelsea Lynch is a 80 y.o. female  With h/o previous stroke, frequent falls, chronic pain, neuropathy, dementia brought to the emergency room by family for increasing weakness over the past week. She had been working with physical therapy prior and was able to anmbulate with assistance and walker. Now she has difficulty even standing. He also had noticed that her left side seems weaker than usual.  She fell a few days ago and since then has had severe left-sided chest wall pain. Daughter was concerned that she may have suffered a new stroke, or has a urinary tract infection. She reportedly had a stroke in the past with left-sided weakness but this resolved. She has had no dysuria, fevers, chills, cough, vomiting or diarrhea. Her appetite has been good.  No recent medication changes. Had a recent admission for similar symptoms and found to have a urinary tract infection.  In the emergency room, x-ray showed left fifth and sixth rib fractures.  workup including urinalysis, CAT scan brain negative.  She has a nerve stimulator implanted and is unable to get an MRI.   Review of Systems:    Past Medical History  Diagnosis Date  . GERD (gastroesophageal reflux disease)   . Depression   . Neuropathy   . Constipation, chronic   . Insomnia   . Chronic pain   . Hypothyroid   . Anxiety   . Frequent falls     "not much in the last few months" (11/08/2013)  . PAF (paroxysmal atrial fibrillation)     not on anticoagulation due to a fall risk, wore an Event monitor 02//23/11-03/03/11  . PAT (paroxysmal atrial tachycardia)     Supraventricular tachycardia; most recent episode associated with sepsis  . Hypertension   . AAA (abdominal aortic aneurysm) without rupture     Followed by  routine Dopplers  . Spinal stenosis   . Complication of anesthesia     "it's hard for me to wakeup" (11/08/2013)  . Pneumonia     "several years ago" (11/08/2013)  . Chronic bronchitis     "hasn't had it in quite a few years" (11/08/2013)  . Shortness of breath     "can come about at anytime" (11/08/2013)  . Sleep apnea     "doesn't wear a mask; RX'd but wouldn't get one" (11/08/2013)  . Headache(784.0)     "at least weekly; sometimes daily" (11/08/2013)  . Stroke ?1980's    denies residual on 11/08/2013  . DDD (degenerative disc disease)     "all of her spine" (11/08/2013)  . Arthritis     "joints; back; hands are getting pretty bad" (11/08/2013)  . Chronic lower back pain   . Chronic leg pain     "nerve pain" (11/08/2013)  . Dementia     "confusion comes and goes; mostly situational; does not have good short term memory but won't admit it" (11/08/2013)   Past Surgical History  Procedure Laterality Date  . Nm myocar perf wall motion  June 2014    No ischemia or infarction, mild apical breast attenuation, EF roughly 70%  . Doppler echocardiography  03/2013    Normal EF 66 5%. Mild/moderate aortic regurgitation, mildly dilated RV with moderate pulmonary hypertension  . Abdomnal aorta  02/25/2012    mid abd. aorta  mid segment  diltaton 3.93 x 4.55 cm greatest diameter.mild amt atherosclerosis without evidence of sigificant diamter reduction  . Lower arterial extremities doppler  02/19/2011    right abi 0.98,left 0.97  . Renal doppler  02/12/2011    abd aorta prox 2.6 x 3.1 cm,mid 4.1 x 3.6 cm, distal 2.3 x 3.4 cm, right renal artery 1-59% left renal artery norm. ,both renal size normal  . Renal doppler  02/11/2010    infrarenal AAA mearsuring 3.6 x 3.7 cm  . Appendectomy    . Cholecystectomy    . Eye surgery    . Lumbar disc surgery  X 3  . Tonsillectomy    . Knee arthroscopy Right ?1980's  . Vaginal hysterectomy    . Dilation and curettage of uterus      "a few" (11/08/2013)  .  Cataract extraction w/ intraocular lens  implant, bilateral Bilateral 2000's  . Anterior cervical decomp/discectomy fusion     Social History:  reports that she has quit smoking. Her smoking use included Cigarettes. She has a 5 pack-year smoking history. She has never used smokeless tobacco. She reports that she drinks alcohol. She reports that she does not use illicit drugs.  Allergies  Allergen Reactions  . Clarithromycin Other (See Comments)    Unknown reaction    . Codeine Nausea And Vomiting  . Tequin [Gatifloxacin] Other (See Comments)    Unknown reaction.  Tolerates Levaquin.  Marland Kitchen Penicillins Rash    Family History  Problem Relation Age of Onset  . Stroke Mother   . Heart attack Father   . Lung cancer Sister   . Bone cancer Sister   . High blood pressure    . Heart disease       Prior to Admission medications   Medication Sig Start Date End Date Taking? Authorizing Provider  acetaminophen (TYLENOL) 500 MG tablet Take 1,000 mg by mouth every 6 (six) hours as needed for mild pain.   Yes Historical Provider, MD  aspirin 325 MG tablet Take 325 mg by mouth at bedtime.   Yes Historical Provider, MD  atorvastatin (LIPITOR) 10 MG tablet Take 1 tablet (10 mg total) by mouth daily at 6 PM. 11/10/13  Yes Esperanza Sheets, MD  Biotin 5000 MCG CAPS Take 5,000 mcg by mouth daily.   Yes Historical Provider, MD  bisacodyl (DULCOLAX) 10 MG suppository Place 10 mg rectally daily as needed for mild constipation or moderate constipation.   Yes Historical Provider, MD  docusate sodium (COLACE) 100 MG capsule Take 200 mg by mouth every evening.    Yes Historical Provider, MD  donepezil (ARICEPT) 5 MG tablet Take 5 mg by mouth at bedtime.   Yes Historical Provider, MD  fish oil-omega-3 fatty acids 1000 MG capsule Take 2 g by mouth every morning.    Yes Historical Provider, MD  furosemide (LASIX) 20 MG tablet Take 20-40 mg by mouth every morning. DEPENDING ON FLUID RETENTION   Yes Historical  Provider, MD  gabapentin (NEURONTIN) 400 MG capsule Take 400 mg by mouth 3 (three) times daily.   Yes Historical Provider, MD  levothyroxine (SYNTHROID, LEVOTHROID) 75 MCG tablet Take 1 tablet (75 mcg total) by mouth daily before breakfast. 04/06/13  Yes Jessica U Vann, DO  lidocaine (LIDODERM) 5 % Place 1 patch onto the skin daily as needed (for pain).  09/20/13 09/20/14 Yes Historical Provider, MD  metoprolol succinate (TOPROL-XL) 50 MG 24 hr tablet Take 25 mg by mouth 2 (two) times daily. Take with or  immediately following a meal. 04/06/13  Yes Jessica U Vann, DO  mirabegron ER (MYRBETRIQ) 50 MG TB24 Take 50 mg by mouth every evening.    Yes Historical Provider, MD  mirtazapine (REMERON) 7.5 MG tablet Take 7.5 mg by mouth every evening.    Yes Historical Provider, MD  nitrofurantoin, macrocrystal-monohydrate, (MACROBID) 100 MG capsule Take 100 mg by mouth every evening.   Yes Historical Provider, MD  oxyCODONE (OXYCONTIN) 10 MG 12 hr tablet Take 10 mg by mouth every 12 (twelve) hours.   Yes Historical Provider, MD  potassium chloride (K-DUR,KLOR-CON) 10 MEQ tablet Take 10 mEq by mouth every other day.   Yes Historical Provider, MD  venlafaxine XR (EFFEXOR-XR) 75 MG 24 hr capsule Take 75 mg by mouth 2 (two) times daily.   Yes Historical Provider, MD  Vitamin D, Ergocalciferol, (DRISDOL) 50000 UNITS CAPS Take 50,000 Units by mouth every 7 (seven) days. wednesdays   Yes Historical Provider, MD  zaleplon (SONATA) 5 MG capsule Take 5 mg by mouth at bedtime.   Yes Historical Provider, MD   Physical Exam: Filed Vitals:   06/25/14 1431 06/25/14 1445 06/25/14 1537 06/25/14 1644  BP: 166/76 160/83  147/73  Pulse: 65 62  66  Temp:   97.7 F (36.5 C) 97.6 F (36.4 C)  TempSrc:    Oral  Resp: 16 14  18   SpO2: 97% 97%  96%    Wt Readings from Last 3 Encounters:  04/19/14 64.547 kg (142 lb 4.8 oz)  01/08/14 64.1 kg (141 lb 5 oz)  11/10/13 61.054 kg (134 lb 9.6 oz)    BP 147/73  Pulse 66  Temp(Src)  97.6 F (36.4 C) (Oral)  Resp 18  SpO2 96%  General Appearance:    Alert, cooperative, no distress, appears stated age, oriented and appropriate  Head:    Normocephalic, without obvious abnormality, atraumatic  Eyes:    PERRL, conjunctiva/corneas clear, EOM's intact, fundi    benign, both eyes     Nose:   Nares normal, septum midline, mucosa normal, no drainage    or sinus tenderness  Throat:   Slightly dry mucous membranes. OP without erythema or exudates  Neck:   Supple, symmetrical, trachea midline, no adenopathy;    thyroid:  no enlargement/tenderness/nodules; no carotid   bruit or JVD  Back:     Symmetric, no curvature, ROM normal, no CVA tenderness  Lungs:     Clear to auscultation bilaterally, respirations unlabored  Chest Wall:    Tender left chest without ecchymosis noted   Heart:    Regular rate and rhythm, S1 and S2 normal, no murmur, rub   or gallop     Abdomen:     Soft, non-tender, bowel sounds active all four quadrants,    no masses, no organomegaly  Genitalia:    deferred  Rectal:    deferred  Extremities:   Extremities normal, atraumatic, no cyanosis or edema  Pulses:   2+ and symmetric all extremities  Skin:   Skin color, texture, turgor normal, no rashes or lesions  Lymph nodes:   Cervical, supraclavicular, and axillary nodes normal  Neurologic:   CNII-XII intact, left arm strength 4/5. Slight pronator drift on left Grips normal lower extremities 5/5             Psych: normal affect. Calm and cooperative  Labs on Admission:  Basic Metabolic Panel:  Recent Labs Lab 06/25/14 1305  NA 142  K 4.2  CL 102  CO2 27  GLUCOSE 88  BUN 24*  CREATININE 1.13*  CALCIUM 9.4   Liver Function Tests:  Recent Labs Lab 06/25/14 1305  AST 16  ALT 12  ALKPHOS 95  BILITOT 0.4  PROT 7.2  ALBUMIN 3.9   No results found for this basename: LIPASE, AMYLASE,  in the last 168 hours No results found for this basename: AMMONIA,  in the last 168 hours CBC:  Recent  Labs Lab 06/25/14 1305  WBC 6.8  NEUTROABS 4.4  HGB 13.3  HCT 40.8  MCV 92.3  PLT 205   Cardiac Enzymes: No results found for this basename: CKTOTAL, CKMB, CKMBINDEX, TROPONINI,  in the last 168 hours  BNP (last 3 results) No results found for this basename: PROBNP,  in the last 8760 hours CBG: No results found for this basename: GLUCAP,  in the last 168 hours  Radiological Exams on Admission: Dg Ribs Unilateral W/chest Left  06/25/2014   CLINICAL DATA:  History of fall with injury to the left anterior ribs.  EXAM: LEFT RIBS AND CHEST - 3+ VIEW  COMPARISON:  Chest x-ray 04/19/2014.  FINDINGS: Lung volumes are low. No consolidative airspace disease. No pneumothorax. Probable trace left pleural effusion. No evidence of pulmonary edema. Heart size is normal. Probable aneurysmal dilatation of the thoracic aorta estimated to measure approximately 5 cm at the level of the aortic arch, similar to recent prior studies. Heart size is normal. Spinal cord stimulator in position.  Dedicated views of the left ribs demonstrate 2 subtle nondisplaced fractures of the lateral aspects of the left fifth and sixth ribs. No other acute displaced fractures are noted.  IMPRESSION: 1. Nondisplaced lateral left fifth and sixth rib fractures. No associated pneumothorax. 2. Low lung volumes with trace left pleural effusion. 3. Atherosclerosis with probable aneurysmal dilatation of the ascending thoracic aorta, unchanged compared to prior examinations.   Electronically Signed   By: Trudie Reed M.D.   On: 06/25/2014 14:01   Ct Head Wo Contrast  06/25/2014   CLINICAL DATA:  Fall.  EXAM: CT HEAD WITHOUT CONTRAST  TECHNIQUE: Contiguous axial images were obtained from the base of the skull through the vertex without intravenous contrast.  COMPARISON:  04/21/2014  FINDINGS: Old lacunar infarct is noted in the right basal ganglia, and an old, small cortical infarct is again seen in the posterior right parietal lobe.  Confluent hypodensities in the subcortical and periventricular white matter bilaterally are similar to the prior study and compatible with advanced chronic small vessel ischemic disease. There is moderate cerebral atrophy. There is no evidence of acute cortical infarct, intracranial hemorrhage, mass, midline shift, or extra-axial fluid collection.  Bilateral ocular lens implants are noted. Mastoid air cells and paranasal sinuses are clear.  IMPRESSION: 1. No evidence of acute intracranial abnormality. 2. Unchanged chronic small vessel ischemic disease.   Electronically Signed   By: Sebastian Ache   On: 06/25/2014 13:51   Assessment/Plan Principal Problem:   Weakness: particularly left arm, but also with worsening gait. CT brain negative. Unable to get MRI due to nerve stimulator. Already on ASA and not a candidate for anticoagulation due to recurrent falls. Had recent echo and doppler, TSH, so won't repeat. Give IVF overnight, decrease gabapentin, pt ot eval. Tele. Obs. Check b12, folate. Family does not want placement Active Problems:   Unspecified hypothyroidism   PAF (paroxysmal atrial fibrillation)   Left rib fracture: judicious pain meds   Chronic pain syndrome   Frequent falls   Dementia   Neuropathy  Insomnia   Chronic constipation   Code Status: full. Family Communication: daughter at bedside Disposition Plan: obs tele  Time spent: 60 min  [facility] Pager [phone]

## 2014-06-25 NOTE — Progress Notes (Signed)
Orthostatic vital signs not taken on admission, patient unable to stand

## 2014-06-26 DIAGNOSIS — R5383 Other fatigue: Secondary | ICD-10-CM | POA: Diagnosis present

## 2014-06-26 DIAGNOSIS — E039 Hypothyroidism, unspecified: Secondary | ICD-10-CM | POA: Diagnosis present

## 2014-06-26 DIAGNOSIS — I359 Nonrheumatic aortic valve disorder, unspecified: Secondary | ICD-10-CM | POA: Diagnosis present

## 2014-06-26 DIAGNOSIS — Z8673 Personal history of transient ischemic attack (TIA), and cerebral infarction without residual deficits: Secondary | ICD-10-CM | POA: Diagnosis not present

## 2014-06-26 DIAGNOSIS — I2789 Other specified pulmonary heart diseases: Secondary | ICD-10-CM | POA: Diagnosis present

## 2014-06-26 DIAGNOSIS — Z8249 Family history of ischemic heart disease and other diseases of the circulatory system: Secondary | ICD-10-CM | POA: Diagnosis not present

## 2014-06-26 DIAGNOSIS — F0390 Unspecified dementia without behavioral disturbance: Secondary | ICD-10-CM | POA: Diagnosis present

## 2014-06-26 DIAGNOSIS — Z9849 Cataract extraction status, unspecified eye: Secondary | ICD-10-CM | POA: Diagnosis not present

## 2014-06-26 DIAGNOSIS — Z9181 History of falling: Secondary | ICD-10-CM | POA: Diagnosis not present

## 2014-06-26 DIAGNOSIS — Z87891 Personal history of nicotine dependence: Secondary | ICD-10-CM | POA: Diagnosis not present

## 2014-06-26 DIAGNOSIS — F329 Major depressive disorder, single episode, unspecified: Secondary | ICD-10-CM | POA: Diagnosis present

## 2014-06-26 DIAGNOSIS — G4700 Insomnia, unspecified: Secondary | ICD-10-CM | POA: Diagnosis present

## 2014-06-26 DIAGNOSIS — G894 Chronic pain syndrome: Secondary | ICD-10-CM | POA: Diagnosis present

## 2014-06-26 DIAGNOSIS — F3289 Other specified depressive episodes: Secondary | ICD-10-CM | POA: Diagnosis present

## 2014-06-26 DIAGNOSIS — W19XXXA Unspecified fall, initial encounter: Secondary | ICD-10-CM | POA: Diagnosis present

## 2014-06-26 DIAGNOSIS — R29898 Other symptoms and signs involving the musculoskeletal system: Secondary | ICD-10-CM

## 2014-06-26 DIAGNOSIS — G8190 Hemiplegia, unspecified affecting unspecified side: Secondary | ICD-10-CM | POA: Diagnosis present

## 2014-06-26 DIAGNOSIS — Z9089 Acquired absence of other organs: Secondary | ICD-10-CM | POA: Diagnosis not present

## 2014-06-26 DIAGNOSIS — F411 Generalized anxiety disorder: Secondary | ICD-10-CM | POA: Diagnosis present

## 2014-06-26 DIAGNOSIS — Z888 Allergy status to other drugs, medicaments and biological substances status: Secondary | ICD-10-CM | POA: Diagnosis not present

## 2014-06-26 DIAGNOSIS — Z79899 Other long term (current) drug therapy: Secondary | ICD-10-CM | POA: Diagnosis not present

## 2014-06-26 DIAGNOSIS — Z881 Allergy status to other antibiotic agents status: Secondary | ICD-10-CM | POA: Diagnosis not present

## 2014-06-26 DIAGNOSIS — R5381 Other malaise: Secondary | ICD-10-CM | POA: Diagnosis present

## 2014-06-26 DIAGNOSIS — I10 Essential (primary) hypertension: Secondary | ICD-10-CM | POA: Diagnosis present

## 2014-06-26 DIAGNOSIS — Z823 Family history of stroke: Secondary | ICD-10-CM | POA: Diagnosis not present

## 2014-06-26 DIAGNOSIS — Z7902 Long term (current) use of antithrombotics/antiplatelets: Secondary | ICD-10-CM | POA: Diagnosis not present

## 2014-06-26 DIAGNOSIS — Z880 Allergy status to penicillin: Secondary | ICD-10-CM | POA: Diagnosis not present

## 2014-06-26 DIAGNOSIS — I4891 Unspecified atrial fibrillation: Secondary | ICD-10-CM | POA: Diagnosis present

## 2014-06-26 DIAGNOSIS — K219 Gastro-esophageal reflux disease without esophagitis: Secondary | ICD-10-CM | POA: Diagnosis present

## 2014-06-26 DIAGNOSIS — S2249XA Multiple fractures of ribs, unspecified side, initial encounter for closed fracture: Secondary | ICD-10-CM | POA: Diagnosis present

## 2014-06-26 DIAGNOSIS — Z961 Presence of intraocular lens: Secondary | ICD-10-CM | POA: Diagnosis not present

## 2014-06-26 DIAGNOSIS — Z801 Family history of malignant neoplasm of trachea, bronchus and lung: Secondary | ICD-10-CM | POA: Diagnosis not present

## 2014-06-26 DIAGNOSIS — Z808 Family history of malignant neoplasm of other organs or systems: Secondary | ICD-10-CM | POA: Diagnosis not present

## 2014-06-26 DIAGNOSIS — K5900 Constipation, unspecified: Secondary | ICD-10-CM | POA: Diagnosis present

## 2014-06-26 LAB — VITAMIN B12: Vitamin B-12: 670 pg/mL (ref 211–911)

## 2014-06-26 LAB — FOLATE RBC: RBC FOLATE: 518 ng/mL (ref >280)

## 2014-06-26 MED ORDER — OXYCODONE HCL ER 10 MG PO T12A
10.0000 mg | EXTENDED_RELEASE_TABLET | Freq: Two times a day (BID) | ORAL | Status: DC
  Administered 2014-06-26 – 2014-06-28 (×4): 10 mg via ORAL
  Filled 2014-06-26 (×4): qty 1

## 2014-06-26 MED ORDER — HYDRALAZINE HCL 20 MG/ML IJ SOLN: 10.0000 mg | Freq: Four times a day (QID) | INTRAMUSCULAR | Status: DC | PRN

## 2014-06-26 MED ORDER — CLOPIDOGREL BISULFATE 75 MG PO TABS
75.0000 mg | ORAL_TABLET | Freq: Every day | ORAL | Status: DC
  Administered 2014-06-27 – 2014-06-28 (×2): 75 mg via ORAL
  Filled 2014-06-26 (×2): qty 1

## 2014-06-26 MED ORDER — SODIUM CHLORIDE 0.9 % IV SOLN
INTRAVENOUS | Status: AC
  Administered 2014-06-26 – 2014-06-27 (×2): via INTRAVENOUS

## 2014-06-26 NOTE — Progress Notes (Signed)
Patient Demographics  Chelsea Lynch Outten, is a 80 y.o. female, DOB - 1925/12/07, ZOX:[id number]  Admit date - 06/25/2014   Admitting Physician Christiane Haorinna L Sullivan, MD  Outpatient Primary MD for the patient is Londell MohPHARR,WALTER DAVIDSON, MD  LOS - 1   Chief Complaint  Patient presents with  . Fall        Subjective:   Chelsea Lynch Dougan today has, No headache, No chest pain, No abdominal pain - No Nausea, No new weakness tingling or numbness, No Cough - SOB. Says no to all.  Assessment & Plan    1. Acute on Chr L Arm weakness and Gen weakness for the last 2 weeks ( recurrent problem) - CT head -ve, MRI cannot be done (has a spinal stimulator). She has chronic weakness in the left arm but family has noticed increased weakness and deconditioning over the last few weeks, for now continue aspirin and statin for secondary prevention. PT OT to the fall, have requested neurology to see as well. UA, B12 and ammonia levels stable. Folic acid levels pending. Recent TSH stable. Try to minimize narcotics and sedatives.   Called and explained to the daughter in detail that this might be out of her aging process and she might require placement at this time, I had given them similar advice a few weeks ago. His stroke is ruled out placement should be considered.        2. Hypothyroidism. Continue home dose Synthroid.  Lab Results  Component Value Date   TSH 2.380 04/20/2014     3. Mild early dementia. On Aricept along with Remeron continue.    4. Essential hypertension on Toprol.    5. Proximal atrial fibrillation. On beta blocker, not a candidate for anticoagulation due to fall risk, aspirin to be continued.       Code Status: Full  Family Communication: daughter suzzane  Disposition Plan:  TBD   Procedures CT head   Consults  Neuro   Medications  Scheduled Meds: . aspirin  325 mg Oral QHS  . atorvastatin  10 mg Oral q1800  . docusate sodium  200 mg Oral QPM  . donepezil  5 mg Oral QHS  . enoxaparin (LOVENOX) injection  40 mg Subcutaneous Q24H  . fish oil-omega-3 fatty acids  2 g Oral q morning - 10a  . gabapentin  400 mg Oral QHS  . levothyroxine  75 mcg Oral QAC breakfast  . metoprolol succinate  25 mg Oral BID  . mirtazapine  7.5 mg Oral QPM  . nitrofurantoin (macrocrystal-monohydrate)  100 mg Oral QPM  . OxyCODONE  10 mg Oral Q12H  . sodium chloride  3 mL Intravenous Q12H  . venlafaxine XR  75 mg Oral BID   Continuous Infusions: . sodium chloride     PRN Meds:.acetaminophen, bisacodyl, lidocaine, ondansetron (ZOFRAN) IV  DVT Prophylaxis  Lovenox   Lab Results  Component Value Date   PLT 205 06/25/2014    Antibiotics     Anti-infectives   None          Objective:   Filed Vitals:   06/25/14 1644 06/25/14 2101 06/26/14 0534 06/26/14 0626  BP: 147/73 124/83 159/80   Pulse: 66 86 87   Temp: 97.6 F (36.4 C)  98.2 F (36.8 C) 97.5 F (36.4 C)   TempSrc: Oral Oral Oral   Resp: 18 19 20    Weight:    68.3 kg (150 lb 9.2 oz)  SpO2: 96% 94% 97%     Wt Readings from Last 3 Encounters:  06/26/14 68.3 kg (150 lb 9.2 oz)  04/19/14 64.547 kg (142 lb 4.8 oz)  01/08/14 64.1 kg (141 lb 5 oz)     Intake/Output Summary (Last 24 hours) at 06/26/14 1002 Last data filed at 06/26/14 0518  Gross per 24 hour  Intake 1558.33 ml  Output      0 ml  Net 1558.33 ml     Physical Exam  Awake  Oriented X 1, No new F.N deficits, has chronic L arm weakness and contracture, Normal affect [place].AT,PERRAL Supple Neck,No JVD, No cervical lymphadenopathy appriciated.  Symmetrical Chest wall movement, Good air movement bilaterally, CTAB RRR,No Gallops,Rubs or new Murmurs, No Parasternal Heave +ve B.Sounds, Abd Soft, No tenderness, No organomegaly appriciated,  No rebound - guarding or rigidity. No Cyanosis, Clubbing or edema, No new Rash or bruise     Data Review   Micro Results No results found for this or any previous visit (from the past 240 hour(s)).  Radiology Reports Dg Ribs Unilateral W/chest Left  06/25/2014   CLINICAL DATA:  History of fall with injury to the left anterior ribs.  EXAM: LEFT RIBS AND CHEST - 3+ VIEW  COMPARISON:  Chest x-ray 04/19/2014.  FINDINGS: Lung volumes are low. No consolidative airspace disease. No pneumothorax. Probable trace left pleural effusion. No evidence of pulmonary edema. Heart size is normal. Probable aneurysmal dilatation of the thoracic aorta estimated to measure approximately 5 cm at the level of the aortic arch, similar to recent prior studies. Heart size is normal. Spinal cord stimulator in position.  Dedicated views of the left ribs demonstrate 2 subtle nondisplaced fractures of the lateral aspects of the left fifth and sixth ribs. No other acute displaced fractures are noted.  IMPRESSION: 1. Nondisplaced lateral left fifth and sixth rib fractures. No associated pneumothorax. 2. Low lung volumes with trace left pleural effusion. 3. Atherosclerosis with probable aneurysmal dilatation of the ascending thoracic aorta, unchanged compared to prior examinations.   Electronically Signed   By: Trudie Reed M.D.   On: 06/25/2014 14:01   Ct Head Wo Contrast  06/25/2014   CLINICAL DATA:  Fall.  EXAM: CT HEAD WITHOUT CONTRAST  TECHNIQUE: Contiguous axial images were obtained from the base of the skull through the vertex without intravenous contrast.  COMPARISON:  04/21/2014  FINDINGS: Old lacunar infarct is noted in the right basal ganglia, and an old, small cortical infarct is again seen in the posterior right parietal lobe. Confluent hypodensities in the subcortical and periventricular white matter bilaterally are similar to the prior study and compatible with advanced chronic small vessel ischemic disease. There is  moderate cerebral atrophy. There is no evidence of acute cortical infarct, intracranial hemorrhage, mass, midline shift, or extra-axial fluid collection.  Bilateral ocular lens implants are noted. Mastoid air cells and paranasal sinuses are clear.  IMPRESSION: 1. No evidence of acute intracranial abnormality. 2. Unchanged chronic small vessel ischemic disease.   Electronically Signed   By: Sebastian Ache   On: 06/25/2014 13:51    CBC  Recent Labs Lab 06/25/14 1305  WBC 6.8  HGB 13.3  HCT 40.8  PLT 205  MCV 92.3  MCH 30.1  MCHC 32.6  RDW 14.0  LYMPHSABS 1.3  MONOABS  0.8  EOSABS 0.3  BASOSABS 0.0    Chemistries   Recent Labs Lab 06/25/14 1305  NA 142  K 4.2  CL 102  CO2 27  GLUCOSE 88  BUN 24*  CREATININE 1.13*  CALCIUM 9.4  AST 16  ALT 12  ALKPHOS 95  BILITOT 0.4   ------------------------------------------------------------------------------------------------------------------ CrCl is unknown because both a height and weight (above a minimum accepted value) are required for this calculation. ------------------------------------------------------------------------------------------------------------------ No results found for this basename: HGBA1C,  in the last 72 hours ------------------------------------------------------------------------------------------------------------------ No results found for this basename: CHOL, HDL, LDLCALC, TRIG, CHOLHDL, LDLDIRECT,  in the last 72 hours ------------------------------------------------------------------------------------------------------------------ No results found for this basename: TSH, T4TOTAL, FREET3, T3FREE, THYROIDAB,  in the last 72 hours ------------------------------------------------------------------------------------------------------------------  Recent Labs  06/25/14 1851  VITAMINB12 670    Coagulation profile No results found for this basename: INR, PROTIME,  in the last 168 hours  No results  found for this basename: DDIMER,  in the last 72 hours  Cardiac Enzymes No results found for this basename: CK, CKMB, TROPONINI, MYOGLOBIN,  in the last 168 hours ------------------------------------------------------------------------------------------------------------------ No components found with this basename: POCBNP,      Time Spent in minutes  35   Zykeria Laguardia K M.D on 06/26/2014 at 10:02 AM  Between 7am to 7pm - Pager - [phone]  After 7pm go to [email or web address] - password [email or web address]  And look for the night coverage person covering for me after hours  [facility] Office  [phone]   **Disclaimer: This note may have been dictated with voice recognition software. Similar sounding words can inadvertently be transcribed and this note may contain transcription errors which may not have been corrected upon publication of note.**

## 2014-06-26 NOTE — Progress Notes (Signed)
Rehab Admissions Coordinator Note:  Patient was screened by Trish MageLogue, Kerstin Crusoe M for appropriateness for an Inpatient Acute Rehab Consult.  At this time, patient is listed under observation status.  Would not currently meet medical necessity criteria for acute inpatient rehab admission.  Call me for questions.  Trish MageLogue, Sundi Slevin M 06/26/2014, 9:32 AM  I can be reached at [phone].

## 2014-06-26 NOTE — Evaluation (Signed)
Occupational Therapy Evaluation Patient Details Name: Chelsea Lynch MRN: [id number] DOB: 11-17-1925 Today's Date: 06/26/2014    History of Present Illness  80 yo female admitted due to Lt side weakness and progressive weakness over 2 weeks. Pt with fall and now Lt Rib fx 5th and 6th.  Ct (-)  PMH: frequent falls, chronic pain, neuropathy, dementia, Lt residual weakness, nerve stimulator    Clinical Impression   PT admitted with Lt side weakness and s/p recent fall with Lt rib fxs. Pt currently with functional limitiations due to the deficits listed below (see OT problem list). PTA pt has had progressive weakness over 2 weeks and previously could (A) in adls. Pt will benefit from skilled OT to increase their independence and safety with adls and balance to allow discharge CIR consult to decr caregiver support upon d/c. OT to follow acutely due to LT side weakness, Lt inattention to complete adl retraining and dynamic balance.     Follow Up Recommendations  CIR;Supervision/Assistance - 24 hour    Equipment Recommendations  None recommended by OT    Recommendations for Other Services Rehab consult     Precautions / Restrictions Precautions Precautions: Fall      Mobility Bed Mobility Overal bed mobility: Needs Assistance Bed Mobility: Supine to Sit;Sidelying to Sit;Rolling Rolling: Mod assist Sidelying to sit: Max assist Supine to sit: Max assist;HOB elevated     General bed mobility comments: pt needed max v/c and leaning to the left. pt unable to push up with LT UE  Transfers Overall transfer level: Needs assistance Equipment used: 1 person hand held assist Transfers: Sit to/from Stand Sit to Stand: Max assist         General transfer comment: Pt required anterior weight shift and facilitation at hips for extension. Pt with leaning Left    Balance Overall balance assessment: Needs assistance Sitting-balance support: Single extremity supported;Feet  supported Sitting balance-Leahy Scale: Poor Sitting balance - Comments: leaning to the left and can state when asked "falling to the left" but unable to correct Postural control: Left lateral lean Standing balance support: Single extremity supported;During functional activity Standing balance-Leahy Scale: Poor                              ADL Overall ADL's : Needs assistance/impaired Eating/Feeding: Minimal assistance;Sitting Eating/Feeding Details (indicate cue type and reason): pt attending to Rt side of tray with some Lt inattentino. pt needs encouragement to initate self feeding. Grooming: Wash/dry face;Minimal assistance;Sitting               Lower Body Dressing: Maximal assistance;Sit to/from stand Lower Body Dressing Details (indicate cue type and reason): pt reaching with Rt UE to fix heel of slide on shoes Toilet Transfer: Maximal assistance;Ambulation           Functional mobility during ADLs: Maximal assistance (Hand held (A)) General ADL Comments:  Pt very implusive reaching with RT UE for environmental supports with max v/c for safety. Caregiver present reports pt is easily distracted at home and needs max cueing for safety. Caregiver reports over the last 2 weeks has become worse. Pt demonstrates Lt awareness at Lt rib cage area. Pt fixated on protecting area with RT UE. pt does not c/o pain to this area during session. pt sitting prematurely with transfer     Vision  Perception     Praxis      Pertinent Vitals/Pain VSS     Hand Dominance Right   Extremity/Trunk Assessment Upper Extremity Assessment Upper Extremity Assessment: LUE deficits/detail LUE Deficits / Details: delayed motor responses to all request, Lt inattention to Lt UE, AROM 3 out 5, requires visual input to open and close digits LUE Coordination: decreased fine motor;decreased gross motor   Lower Extremity Assessment Lower Extremity Assessment:  Defer to PT evaluation   Cervical / Trunk Assessment Cervical / Trunk Assessment: Normal (hx of back pain with stimulator)   Communication Communication Communication: No difficulties   Cognition Arousal/Alertness: Awake/alert Behavior During Therapy: WFL for tasks assessed/performed Overall Cognitive Status: History of cognitive impairments - at baseline                     General Comments       Exercises       Shoulder Instructions      Home Living Family/patient expects to be discharged to:: Private residence Living Arrangements: Spouse/significant other;Other (Comment) (Companion services caregivers) Available Help at Discharge: Personal care attendant;Available 24 hours/day Type of Home: House Home Access: Stairs to enter Entergy CorporationEntrance Stairs-Number of Steps: 5 Entrance Stairs-Rails: Right;Left Home Layout: One level     Bathroom Shower/Tub: Chief Strategy OfficerTub/shower unit   Bathroom Toilet: Handicapped height Bathroom Accessibility: Yes   Home Equipment: Cane - quad;Walker - 4 wheels;Bedside commode;Shower seat;Hand held shower head   Additional Comments: pt has 24/7 caregivers that have progressively (A) more with ADLs due to progressive weakness.       Prior Functioning/Environment Level of Independence: Independent with assistive device(s);Needs assistance  Gait / Transfers Assistance Needed: amb with 4wh walker ADL's / Homemaking Assistance Needed: total (A) at this time due to attention and progressive weakness. Prior to 2 weeks ago could complete ADLs with setup and verbal cues        OT Diagnosis: Cognitive deficits;Generalized weakness   OT Problem List: Decreased strength;Decreased activity tolerance;Impaired balance (sitting and/or standing);Decreased safety awareness;Decreased knowledge of use of DME or AE;Decreased knowledge of precautions;Decreased cognition;Impaired UE functional use   OT Treatment/Interventions: Self-care/ADL training;Therapeutic  exercise;DME and/or AE instruction;Energy conservation;Neuromuscular education;Balance training;Patient/family education;Cognitive remediation/compensation;Therapeutic activities    OT Goals(Current goals can be found in the care plan section) Acute Rehab OT Goals Patient Stated Goal: to drink some coffee OT Goal Formulation: With patient Time For Goal Achievement: 07/10/14 Potential to Achieve Goals: Good  OT Frequency: Min 2X/week   Barriers to D/C:    pt has 24/ 7 (A) from Companion caregivers services. Pt requiring incr care from 2 weeks ago       Co-evaluation              End of Session Nurse Communication: Mobility status;Precautions  Activity Tolerance: Patient tolerated treatment well Patient left: in chair;with call bell/phone within reach;with family/visitor present   Time: [id number] OT Time Calculation (min): 23 min Charges:  OT General Charges $OT Visit: 1 Procedure OT Evaluation $Initial OT Evaluation Tier I: 1 Procedure OT Treatments $Self Care/Home Management : 8-22 mins G-Codes: OT G-codes **NOT FOR INPATIENT CLASS** Functional Assessment Tool Used: clinical observation Functional Limitation: Self care Self Care Current Status (G6440(G8987): At least 80 percent but less than 100 percent impaired, limited or restricted Self Care Goal Status (H4742(G8988): At least 80 percent but less than 100 percent impaired, limited or restricted  Harolyn RutherfordJones, Belkis Norbeck B 06/26/2014, 9:04 AM Pager: [phone]

## 2014-06-26 NOTE — Consult Note (Signed)
Referring Physician: Thedore Mins    Chief Complaint: left sided weakness  HPI:                                                                                                                                         Chelsea Lynch is an 80 y.o. female who has had frequent fall sin past. Is on ASA, history of PAF.  One weak ago patient was noted to have left arm and leg weakness. It initially started with mild weakness but in the course of 24 hours she was noted to be dragging her left leg. Per family, these symptoms have not changed over the last week.  She was brought to the hospital secondary to fall. Patient has a back stimulator and was unable to have MRI on admission.  Initial CT head showed old right parietal infarcts but no acute infarct or bleed. In may patient recently had a carotid doppler and echo both of which were normal. A1c and LDL are WNL.   Date last known well: Date: 06/19/2014 Time last known well: Unable to determine tPA Given: No: out of window  Past Medical History  Diagnosis Date  . GERD (gastroesophageal reflux disease)   . Depression   . Neuropathy   . Constipation, chronic   . Insomnia   . Chronic pain   . Hypothyroid   . Anxiety   . Frequent falls     "not much in the last few months" (11/08/2013)  . PAF (paroxysmal atrial fibrillation)     not on anticoagulation due to a fall risk, wore an Event monitor 02//23/11-03/03/11  . PAT (paroxysmal atrial tachycardia)     Supraventricular tachycardia; most recent episode associated with sepsis  . Hypertension   . AAA (abdominal aortic aneurysm) without rupture     Followed by routine Dopplers  . Spinal stenosis   . Complication of anesthesia     "it's hard for me to wakeup" (11/08/2013)  . Pneumonia     "several years ago" (11/08/2013)  . Chronic bronchitis     "hasn't had it in quite a few years" (11/08/2013)  . Shortness of breath     "can come about at anytime" (11/08/2013)  . Sleep apnea     "doesn't wear a mask;  RX'd but wouldn't get one" (11/08/2013)  . Headache(784.0)     "at least weekly; sometimes daily" (11/08/2013)  . Stroke ?1980's    denies residual on 11/08/2013  . DDD (degenerative disc disease)     "all of her spine" (11/08/2013)  . Arthritis     "joints; back; hands are getting pretty bad" (11/08/2013)  . Chronic lower back pain   . Chronic leg pain     "nerve pain" (11/08/2013)  . Dementia     "confusion comes and goes; mostly situational; does not have good short term memory but won't admit it" (11/08/2013)  Past Surgical History  Procedure Laterality Date  . Nm myocar perf wall motion  June 2014    No ischemia or infarction, mild apical breast attenuation, EF roughly 70%  . Doppler echocardiography  03/2013    Normal EF 66 5%. Mild/moderate aortic regurgitation, mildly dilated RV with moderate pulmonary hypertension  . Abdomnal aorta  02/25/2012    mid abd. aorta  mid segment diltaton 3.93 x 4.55 cm greatest diameter.mild amt atherosclerosis without evidence of sigificant diamter reduction  . Lower arterial extremities doppler  02/19/2011    right abi 0.98,left 0.97  . Renal doppler  02/12/2011    abd aorta prox 2.6 x 3.1 cm,mid 4.1 x 3.6 cm, distal 2.3 x 3.4 cm, right renal artery 1-59% left renal artery norm. ,both renal size normal  . Renal doppler  02/11/2010    infrarenal AAA mearsuring 3.6 x 3.7 cm  . Appendectomy    . Cholecystectomy    . Eye surgery    . Lumbar disc surgery  X 3  . Tonsillectomy    . Knee arthroscopy Right ?1980's  . Vaginal hysterectomy    . Dilation and curettage of uterus      "a few" (11/08/2013)  . Cataract extraction w/ intraocular lens  implant, bilateral Bilateral 2000's  . Anterior cervical decomp/discectomy fusion      Family History  Problem Relation Age of Onset  . Stroke Mother   . Heart attack Father   . Lung cancer Sister   . Bone cancer Sister   . High blood pressure    . Heart disease     Social History:  reports that she  has quit smoking. Her smoking use included Cigarettes. She has a 5 pack-year smoking history. She has never used smokeless tobacco. She reports that she drinks alcohol. She reports that she does not use illicit drugs.  Allergies:  Allergies  Allergen Reactions  . Clarithromycin Other (See Comments)    Unknown reaction    . Codeine Nausea And Vomiting  . Tequin [Gatifloxacin] Other (See Comments)    Unknown reaction.  Tolerates Levaquin.  Marland Kitchen Penicillins Rash    Medications:                                                                                                                           Prior to Admission:  Prescriptions prior to admission  Medication Sig Dispense Refill  . acetaminophen (TYLENOL) 500 MG tablet Take 1,000 mg by mouth every 6 (six) hours as needed for mild pain.      Marland Kitchen aspirin 325 MG tablet Take 325 mg by mouth at bedtime.      Marland Kitchen atorvastatin (LIPITOR) 10 MG tablet Take 1 tablet (10 mg total) by mouth daily at 6 PM.  30 tablet  1  . Biotin 5000 MCG CAPS Take 5,000 mcg by mouth daily.      . bisacodyl (DULCOLAX) 10 MG suppository Place 10 mg rectally daily as needed  for mild constipation or moderate constipation.      . docusate sodium (COLACE) 100 MG capsule Take 200 mg by mouth every evening.       . donepezil (ARICEPT) 5 MG tablet Take 5 mg by mouth at bedtime.      . fish oil-omega-3 fatty acids 1000 MG capsule Take 2 g by mouth every morning.       . furosemide (LASIX) 20 MG tablet Take 20-40 mg by mouth every morning. DEPENDING ON FLUID RETENTION      . gabapentin (NEURONTIN) 400 MG capsule Take 400 mg by mouth 3 (three) times daily.      Marland Kitchen. levothyroxine (SYNTHROID, LEVOTHROID) 75 MCG tablet Take 1 tablet (75 mcg total) by mouth daily before breakfast.  30 tablet  0  . lidocaine (LIDODERM) 5 % Place 1 patch onto the skin daily as needed (for pain).       . metoprolol succinate (TOPROL-XL) 50 MG 24 hr tablet Take 25 mg by mouth 2 (two) times daily. Take with or  immediately following a meal.      . mirabegron ER (MYRBETRIQ) 50 MG TB24 Take 50 mg by mouth every evening.       . mirtazapine (REMERON) 7.5 MG tablet Take 7.5 mg by mouth every evening.       . nitrofurantoin, macrocrystal-monohydrate, (MACROBID) 100 MG capsule Take 100 mg by mouth every evening.      Marland Kitchen. oxyCODONE (OXYCONTIN) 10 MG 12 hr tablet Take 10 mg by mouth every 12 (twelve) hours.      . potassium chloride (K-DUR,KLOR-CON) 10 MEQ tablet Take 10 mEq by mouth every other day.      . venlafaxine XR (EFFEXOR-XR) 75 MG 24 hr capsule Take 75 mg by mouth 2 (two) times daily.      . Vitamin D, Ergocalciferol, (DRISDOL) 50000 UNITS CAPS Take 50,000 Units by mouth every 7 (seven) days. wednesdays      . zaleplon (SONATA) 5 MG capsule Take 5 mg by mouth at bedtime.       Scheduled: . aspirin  325 mg Oral QHS  . atorvastatin  10 mg Oral q1800  . docusate sodium  200 mg Oral QPM  . donepezil  5 mg Oral QHS  . enoxaparin (LOVENOX) injection  40 mg Subcutaneous Q24H  . fish oil-omega-3 fatty acids  2 g Oral q morning - 10a  . gabapentin  400 mg Oral QHS  . levothyroxine  75 mcg Oral QAC breakfast  . metoprolol succinate  25 mg Oral BID  . mirtazapine  7.5 mg Oral QPM  . nitrofurantoin (macrocrystal-monohydrate)  100 mg Oral QPM  . OxyCODONE  10 mg Oral Q12H  . sodium chloride  3 mL Intravenous Q12H  . venlafaxine XR  75 mg Oral BID    ROS:  History obtained from the patient  General ROS: negative for - chills, fatigue, fever, night sweats, weight gain or weight loss Psychological ROS: negative for - behavioral disorder, hallucinations, memory difficulties, mood swings or suicidal ideation Ophthalmic ROS: negative for - blurry vision, double vision, eye pain or loss of vision ENT ROS: negative for - epistaxis, nasal discharge, oral lesions, sore throat,  tinnitus or vertigo Allergy and Immunology ROS: negative for - hives or itchy/watery eyes Hematological and Lymphatic ROS: negative for - bleeding problems, bruising or swollen lymph nodes Endocrine ROS: negative for - galactorrhea, hair pattern changes, polydipsia/polyuria or temperature intolerance Respiratory ROS: negative for - cough, hemoptysis, shortness of breath or wheezing Cardiovascular ROS: negative for - chest pain, dyspnea on exertion, edema or irregular heartbeat Gastrointestinal ROS: negative for - abdominal pain, diarrhea, hematemesis, nausea/vomiting or stool incontinence Genito-Urinary ROS: negative for - dysuria, hematuria, incontinence or urinary frequency/urgency Musculoskeletal ROS: negative for - joint swelling or muscular weakness Neurological ROS: as noted in HPI Dermatological ROS: negative for rash and skin lesion changes  Neurologic Examination:                                                                                                      Blood pressure 177/95, pulse 81, temperature 97.4 F (36.3 C), temperature source Oral, resp. rate 20, weight 68.3 kg (150 lb 9.2 oz), SpO2 100.00%.   General: NAD Mental Status: Alert, oriented, thought content appropriate.  Speech fluent without evidence of aphasia.  Able to follow 3 step commands without difficulty. Cranial Nerves: II: Discs flat bilaterally; Visual fields grossly normal, pupils equal, round, reactive to light and accommodation III,IV, VI: ptosis not present, extra-ocular motions intact bilaterally V,VII: smile symmetric, facial light touch sensation normal bilaterally VIII: hearing normal bilaterally IX,X: gag reflex present XI: bilateral shoulder shrug XII: midline tongue extension without atrophy or fasciculations  Motor: Right : Upper extremity   5/5    Left:     Upper extremity   5/5  Lower extremity   5/5     Lower extremity   5/5 Tone and bulk:normal tone throughout; no atrophy  noted Sensory: Pinprick and light touch decreased on the left leg Deep Tendon Reflexes:  Right: Upper Extremity   Left: Upper extremity   biceps (C-5 to C-6) 2/4   biceps (C-5 to C-6) 2/4 tricep (C7) 2/4    triceps (C7) 2/4 Brachioradialis (C6) 2/4  Brachioradialis (C6) 2/4  Lower Extremity Lower Extremity  quadriceps (L-2 to L-4) 2/4   quadriceps (L-2 to L-4) 2/4 Achilles (S1) 2/4   Achilles (S1) 2/4  Plantars: Right: downgoing   Left: downgoing Cerebellar: normal finger-to-nose,  normal heel-to-shin test Gait: not tested CV: pulses palpable throughout    Lab Results: Basic Metabolic Panel:  Recent Labs Lab 06/25/14 1305  NA 142  K 4.2  CL 102  CO2 27  GLUCOSE 88  BUN 24*  CREATININE 1.13*  CALCIUM 9.4    Liver Function Tests:  Recent Labs Lab 06/25/14 1305  AST 16  ALT 12  ALKPHOS 95  BILITOT 0.4  PROT 7.2  ALBUMIN 3.9   No results found for this basename: LIPASE, AMYLASE,  in the last 168 hours  Recent Labs Lab 06/25/14 1917  AMMONIA 18    CBC:  Recent Labs Lab 06/25/14 1305  WBC 6.8  NEUTROABS 4.4  HGB 13.3  HCT 40.8  MCV 92.3  PLT 205    Cardiac Enzymes:  Recent Labs Lab 06/25/14 1851  CKTOTAL 34    Lipid Panel: No results found for this basename: CHOL, TRIG, HDL, CHOLHDL, VLDL, LDLCALC,  in the last 168 hours  CBG: No results found for this basename: GLUCAP,  in the last 168 hours  Microbiology: Results for orders placed during the hospital encounter of 04/19/14  URINE CULTURE     Status: None   Collection Time    04/19/14  3:40 PM      Result Value Ref Range Status   Specimen Description URINE, CATHETERIZED   Final   Special Requests NONE   Final   Culture  Setup Time     Final   Value: 04/19/2014 18:53     Performed at [facility] Count     Final   Value: NO GROWTH     Performed at [facility]   Culture     Final   Value: NO GROWTH     Performed at [facility]   Report  Status 04/21/2014 FINAL   Final  CULTURE, BLOOD (ROUTINE X 2)     Status: None   Collection Time    04/19/14  8:51 PM      Result Value Ref Range Status   Specimen Description BLOOD FOREARM RIGHT   Final   Special Requests BOTTLES DRAWN AEROBIC AND ANAEROBIC 5CC   Final   Culture  Setup Time     Final   Value: 04/20/2014 04:18     Performed at [facility]   Culture     Final   Value: NO GROWTH 5 DAYS     Performed at [facility]   Report Status 04/26/2014 FINAL   Final  CULTURE, BLOOD (ROUTINE X 2)     Status: None   Collection Time    04/19/14  9:05 PM      Result Value Ref Range Status   Specimen Description BLOOD HAND RIGHT   Final   Special Requests BOTTLES DRAWN AEROBIC ONLY [facility]   Final   Culture  Setup Time     Final   Value: 04/20/2014 04:20     Performed at [facility]   Culture     Final   Value: NO GROWTH 5 DAYS     Performed at [facility]   Report Status 04/26/2014 FINAL   Final    Coagulation Studies: No results found for this basename: LABPROT, INR,  in the last 72 hours  Imaging: Dg Ribs Unilateral W/chest Left  06/25/2014   CLINICAL DATA:  History of fall with injury to the left anterior ribs.  EXAM: LEFT RIBS AND CHEST - 3+ VIEW  COMPARISON:  Chest x-ray 04/19/2014.  FINDINGS: Lung volumes are low. No consolidative airspace disease. No pneumothorax. Probable trace left pleural effusion. No evidence of pulmonary edema. Heart size is normal. Probable aneurysmal dilatation of the thoracic aorta estimated to measure approximately 5 cm at the level of the aortic arch, similar to recent prior studies. Heart size is normal. Spinal cord stimulator in position.  Dedicated views of the left  ribs demonstrate 2 subtle nondisplaced fractures of the lateral aspects of the left fifth and sixth ribs. No other acute displaced fractures are noted.  IMPRESSION: 1. Nondisplaced lateral left fifth and sixth rib fractures. No associated pneumothorax.  2. Low lung volumes with trace left pleural effusion. 3. Atherosclerosis with probable aneurysmal dilatation of the ascending thoracic aorta, unchanged compared to prior examinations.   Electronically Signed   By: Trudie Reed M.D.   On: 06/25/2014 14:01   Ct Head Wo Contrast  06/25/2014   CLINICAL DATA:  Fall.  EXAM: CT HEAD WITHOUT CONTRAST  TECHNIQUE: Contiguous axial images were obtained from the base of the skull through the vertex without intravenous contrast.  COMPARISON:  04/21/2014  FINDINGS: Old lacunar infarct is noted in the right basal ganglia, and an old, small cortical infarct is again seen in the posterior right parietal lobe. Confluent hypodensities in the subcortical and periventricular white matter bilaterally are similar to the prior study and compatible with advanced chronic small vessel ischemic disease. There is moderate cerebral atrophy. There is no evidence of acute cortical infarct, intracranial hemorrhage, mass, midline shift, or extra-axial fluid collection.  Bilateral ocular lens implants are noted. Mastoid air cells and paranasal sinuses are clear.  IMPRESSION: 1. No evidence of acute intracranial abnormality. 2. Unchanged chronic small vessel ischemic disease.   Electronically Signed   By: Sebastian Ache   On: 06/25/2014 13:51       Assessment and plan discussed with with attending physician and they are in agreement.    Felicie Morn PA-C [facility] [phone]  06/26/2014, 4:09 PM   Assessment: 80 y.o. female with one week history of left leg and arm weakness.  On exam patient shows no significant weakness but states her left leg has decreased sensation compared to right. Unfortunately she is unable to obtain MRI due to implanted pain stimulator. With history of right parietal infarcts in the past and PAF cannot rule out small acute infarct.  Patietn has recently had Echo, carotid doppler which were normal.  Recent A1c and FLP WNL. At this time only  recommendation would be to change ASA to Plavix and PT.   Stroke Risk Factors - atrial fibrillation and hypertension   Recommend: 1) change ASA to Plavix 2) PT  Neurology S/O Patient seen and examined together with physician assistant and I concur with the assessment and plan.  Wyatt Portela, MD

## 2014-06-26 NOTE — Evaluation (Signed)
Physical Therapy Evaluation Patient Details Name: Chelsea Lynch MRN: [id number] DOB: 1925-08-15 Today's Date: 06/26/2014   History of Present Illness   80 yo female admitted due to Lt side weakness and progressive weakness over 2 weeks. Pt with fall and now Lt Rib fx 5th and 6th.  Ct (-)  PMH: frequent falls, chronic pain, neuropathy, dementia, Lt residual weakness, nerve stimulator   Clinical Impression   Pt admitted with above. Pt currently with functional limitations due to the deficits listed below (see PT Problem List). Her presentation functionally is consistent with a stroke, though unable to obtain MRI due to presence of nerve stimulator;  Pt will benefit from skilled PT to increase their independence and safety with mobility to allow discharge to the venue listed below.   Pt has plenty of assist in the home, and shows good potential for improvement with intensive therapies; Will recommend CIR stay to maximize independence and safety with mobility and decr caregiver burden;  Noted pt is currently on Observation status; Can we take closer look at her case to see what can qualify her as an inpatient, thereby opening up dc to CIR as a possibility?     Follow Up Recommendations CIR    Equipment Recommendations  None recommended by PT (Will consider hospital bed)    Recommendations for Other Services Rehab consult     Precautions / Restrictions Precautions Precautions: Fall      Mobility  Bed Mobility Overal bed mobility: Needs Assistance Bed Mobility: Supine to Sit;Sidelying to Sit;Rolling Rolling: Mod assist Sidelying to sit: Max assist Supine to sit: Max assist;HOB elevated     General bed mobility comments: in recliner upon arrival  Transfers Overall transfer level: Needs assistance Equipment used: 4-wheeled walker Transfers: Sit to/from Stand Sit to Stand: Max assist         General transfer comment: Pt required anterior weight shift and facilitation at hips  for extension. Pt with leaning Left  Ambulation/Gait Ambulation/Gait assistance: Max assist;+2 safety/equipment Ambulation Distance (Feet): 20 Feet Assistive device: 4-wheeled walker Gait Pattern/deviations: Scissoring;Shuffle;Staggering left Gait velocity: slowed   General Gait Details: Significant L lean with need for constant max assist for support; L hip and knee flexed in stance; tendeing to keep L elbow against chest, guarding Rib fxs  Stairs            Wheelchair Mobility    Modified Rankin (Stroke Patients Only) Modified Rankin (Stroke Patients Only) Pre-Morbid Rankin Score: Moderately severe disability Modified Rankin: Severe disability     Balance Overall balance assessment: Needs assistance Sitting-balance support: Single extremity supported;Feet supported Sitting balance-Leahy Scale: Poor Sitting balance - Comments: leaning to the left and can state when asked "falling to the left" but unable to correct Postural control: Left lateral lean Standing balance support: Bilateral upper extremity supported Standing balance-Leahy Scale: Poor Standing balance comment: Stands with L hip and knee flexed, and significane L lean                             Pertinent Vitals/Pain L Rib pain, which pt did not rate; Placed pillow for splinting ribs with cough, and patient repositioned for comfort     Home Living Family/patient expects to be discharged to:: Private residence Living Arrangements: Spouse/significant other;Other (Comment) (Companion services caregivers) Available Help at Discharge: Personal care attendant;Available 24 hours/day Type of Home: House Home Access: Stairs to enter Entrance Stairs-Rails: Right;Left Entrance Stairs-Number of Steps: 5 Home  Layout: One level Home Equipment: Gilmer Mor - quad;Walker - 4 wheels;Bedside commode;Shower seat;Hand held shower head Additional Comments: pt has 24/7 caregivers that have progressively (A) more with  ADLs due to progressive weakness.     Prior Function Level of Independence: Independent with assistive device(s);Needs assistance   Gait / Transfers Assistance Needed: amb with 4wh walker  ADL's / Homemaking Assistance Needed: total (A) at this time due to attention and progressive weakness. Prior to 2 weeks ago could complete ADLs with setup and verbal cues        Hand Dominance   Dominant Hand: Right    Extremity/Trunk Assessment   Upper Extremity Assessment: Defer to OT evaluation       LUE Deficits / Details: delayed motor responses to all request, Lt inattention to Lt UE, AROM 3 out 5, requires visual input to open and close digits   Lower Extremity Assessment: Generalized weakness;LLE deficits/detail   LLE Deficits / Details: Functional weakness L hip and knee ext in standing, with decr muscle endurance and tendency to stand with hip and knee flexed  Cervical / Trunk Assessment: Normal;Kyphotic (hx of back pain with stimulator)  Communication   Communication: No difficulties  Cognition Arousal/Alertness: Awake/alert Behavior During Therapy: WFL for tasks assessed/performed Overall Cognitive Status: History of cognitive impairments - at baseline                      General Comments      Exercises        Assessment/Plan    PT Assessment Patient needs continued PT services  PT Diagnosis Difficulty walking;Abnormality of gait;Generalized weakness;Acute pain;Hemiplegia non-dominant side   PT Problem List Decreased strength;Decreased range of motion;Decreased activity tolerance;Decreased balance;Decreased mobility;Decreased coordination;Decreased cognition;Decreased knowledge of use of DME;Decreased safety awareness;Pain;Decreased knowledge of precautions  PT Treatment Interventions DME instruction;Gait training;Stair training;Functional mobility training;Therapeutic activities;Therapeutic exercise;Balance training;Patient/family education   PT Goals  (Current goals can be found in the Care Plan section) Acute Rehab PT Goals Patient Stated Goal: to drink some coffee PT Goal Formulation: With patient Time For Goal Achievement: 07/10/14 Potential to Achieve Goals: Good    Frequency Min 3X/week   Barriers to discharge        Co-evaluation               End of Session Equipment Utilized During Treatment: Gait belt Activity Tolerance: Patient tolerated treatment well Patient left: in chair;with call bell/phone within reach;with family/visitor present Nurse Communication: Mobility status    Functional Assessment Tool Used: Clinical Judgement Functional Limitation: Mobility: Walking and moving around Mobility: Walking and Moving Around Current Status [phone]): At least 60 percent but less than 80 percent impaired, limited or restricted Mobility: Walking and Moving Around Goal Status [phone]): At least 20 percent but less than 40 percent impaired, limited or restricted    Time: 0847-0909 PT Time Calculation (min): 22 min   Charges:   PT Evaluation $Initial PT Evaluation Tier I: 1 Procedure PT Treatments $Gait Training: 8-22 mins   PT G Codes:   Functional Assessment Tool Used: Clinical Judgement Functional Limitation: Mobility: Walking and moving around    [place] Hamff 06/26/2014, 9:53 AM  Van Clines, PT  Acute Rehabilitation Services Pager [phone] Office [phone]

## 2014-06-27 DIAGNOSIS — F03918 Unspecified dementia, unspecified severity, with other behavioral disturbance: Secondary | ICD-10-CM

## 2014-06-27 DIAGNOSIS — G8190 Hemiplegia, unspecified affecting unspecified side: Secondary | ICD-10-CM

## 2014-06-27 DIAGNOSIS — I4891 Unspecified atrial fibrillation: Secondary | ICD-10-CM

## 2014-06-27 DIAGNOSIS — I6330 Cerebral infarction due to thrombosis of unspecified cerebral artery: Secondary | ICD-10-CM

## 2014-06-27 DIAGNOSIS — F0391 Unspecified dementia with behavioral disturbance: Secondary | ICD-10-CM

## 2014-06-27 DIAGNOSIS — E039 Hypothyroidism, unspecified: Secondary | ICD-10-CM

## 2014-06-27 NOTE — Consult Note (Signed)
Physical Medicine and Rehabilitation Consult  Reason for Consult: LLE weakness, falls with rib fractures Referring Physician: Dr.  Suanne Marker   HPI: Chelsea Lynch is a 80 y.o. female with history of R-parietal CVA, chronic pain with neuropathy--spinal cord stimulator, PAF, dementia; who was admitted with one week history of progressive weakness with difficulty walking, falls and left sided weakness. Patient with admission  5/15 for AMS due to dehydration and weakness. CT head negative for acute changes. CXR with  Nondisplaced left 5th and 6th rib fractures and low lung volumes. Right hip xrays with severe degenerative changes and no acute Fx. Neurology consulted for input and questioned small acute infarct in setting of PAF (as patient unable to get MRI) and recommends changing ASA to plavix. PT/OT evaluations done and patient with left sided weakness, left inattention as well as impulsivity requiring max cues for safety.  Rehab team and MD recommending CIR for follow up therapies.   At baseline: Patient ambulating at  supervision with walker.  She could complete ADL tasks with setup and verbal cues. Sedentary and homebound.  Has caregivers 24/7.   Review of Systems  HENT: Negative for hearing loss.   Eyes: Positive for redness. Negative for blurred vision, double vision and discharge.  Respiratory: Positive for shortness of breath (with exertion ). Negative for cough and sputum production.   Gastrointestinal: Negative for heartburn.  Genitourinary: Positive for frequency.  Musculoskeletal: Positive for back pain, joint pain and myalgias.  Neurological: Positive for sensory change, focal weakness and headaches (daily at 6 pm). Negative for dizziness and tingling.  Psychiatric/Behavioral: Positive for memory loss. Negative for depression. The patient has insomnia (awake at nights and sleeps early am).    Past Medical History  Diagnosis Date  . GERD (gastroesophageal reflux disease)   .  Depression   . Neuropathy   . Constipation, chronic   . Insomnia   . Chronic pain   . Hypothyroid   . Anxiety   . Frequent falls     "not much in the last few months" (11/08/2013)  . PAF (paroxysmal atrial fibrillation)     not on anticoagulation due to a fall risk, wore an Event monitor 02//23/11-03/03/11  . PAT (paroxysmal atrial tachycardia)     Supraventricular tachycardia; most recent episode associated with sepsis  . Hypertension   . AAA (abdominal aortic aneurysm) without rupture     Followed by routine Dopplers  . Spinal stenosis   . Complication of anesthesia     "it's hard for me to wakeup" (11/08/2013)  . Pneumonia     "several years ago" (11/08/2013)  . Chronic bronchitis     "hasn't had it in quite a few years" (11/08/2013)  . Shortness of breath     "can come about at anytime" (11/08/2013)  . Sleep apnea     "doesn't wear a mask; RX'd but wouldn't get one" (11/08/2013)  . Headache(784.0)     "at least weekly; sometimes daily" (11/08/2013)  . Stroke ?1980's    denies residual on 11/08/2013  . DDD (degenerative disc disease)     "all of her spine" (11/08/2013)  . Arthritis     "joints; back; hands are getting pretty bad" (11/08/2013)  . Chronic lower back pain   . Chronic leg pain     "nerve pain" (11/08/2013)  . Dementia     "confusion comes and goes; mostly situational; does not have good short term memory but won't admit it" (11/08/2013)  Past Surgical History  Procedure Laterality Date  . Nm myocar perf wall motion  June 2014    No ischemia or infarction, mild apical breast attenuation, EF roughly 70%  . Doppler echocardiography  03/2013    Normal EF 66 5%. Mild/moderate aortic regurgitation, mildly dilated RV with moderate pulmonary hypertension  . Abdomnal aorta  02/25/2012    mid abd. aorta  mid segment diltaton 3.93 x 4.55 cm greatest diameter.mild amt atherosclerosis without evidence of sigificant diamter reduction  . Lower arterial extremities doppler   02/19/2011    right abi 0.98,left 0.97  . Renal doppler  02/12/2011    abd aorta prox 2.6 x 3.1 cm,mid 4.1 x 3.6 cm, distal 2.3 x 3.4 cm, right renal artery 1-59% left renal artery norm. ,both renal size normal  . Renal doppler  02/11/2010    infrarenal AAA mearsuring 3.6 x 3.7 cm  . Appendectomy    . Cholecystectomy    . Eye surgery    . Lumbar disc surgery  X 3  . Tonsillectomy    . Knee arthroscopy Right ?1980's  . Vaginal hysterectomy    . Dilation and curettage of uterus      "a few" (11/08/2013)  . Cataract extraction w/ intraocular lens  implant, bilateral Bilateral 2000's  . Anterior cervical decomp/discectomy fusion     Family History  Problem Relation Age of Onset  . Stroke Mother   . Heart attack Father   . Lung cancer Sister   . Bone cancer Sister   . High blood pressure    . Heart disease     Social History:  Married. Per reports that she has quit smoking. Her smoking use included Cigarettes. She has a 5 pack-year smoking history. She has never used smokeless tobacco. She does not use alcohol. She reports that she does not use illicit drugs.   Allergies  Allergen Reactions  . Clarithromycin Other (See Comments)    Unknown reaction    . Codeine Nausea And Vomiting  . Tequin [Gatifloxacin] Other (See Comments)    Unknown reaction.  Tolerates Levaquin.  Marland Kitchen Penicillins Rash    Medications Prior to Admission  Medication Sig Dispense Refill  . acetaminophen (TYLENOL) 500 MG tablet Take 1,000 mg by mouth every 6 (six) hours as needed for mild pain.      Marland Kitchen aspirin 325 MG tablet Take 325 mg by mouth at bedtime.      Marland Kitchen atorvastatin (LIPITOR) 10 MG tablet Take 1 tablet (10 mg total) by mouth daily at 6 PM.  30 tablet  1  . Biotin 5000 MCG CAPS Take 5,000 mcg by mouth daily.      . bisacodyl (DULCOLAX) 10 MG suppository Place 10 mg rectally daily as needed for mild constipation or moderate constipation.      . docusate sodium (COLACE) 100 MG capsule Take 200 mg by mouth  every evening.       . donepezil (ARICEPT) 5 MG tablet Take 5 mg by mouth at bedtime.      . fish oil-omega-3 fatty acids 1000 MG capsule Take 2 g by mouth every morning.       . furosemide (LASIX) 20 MG tablet Take 20-40 mg by mouth every morning. DEPENDING ON FLUID RETENTION      . gabapentin (NEURONTIN) 400 MG capsule Take 400 mg by mouth 3 (three) times daily.      Marland Kitchen levothyroxine (SYNTHROID, LEVOTHROID) 75 MCG tablet Take 1 tablet (75 mcg total) by mouth  daily before breakfast.  30 tablet  0  . lidocaine (LIDODERM) 5 % Place 1 patch onto the skin daily as needed (for pain).       . metoprolol succinate (TOPROL-XL) 50 MG 24 hr tablet Take 25 mg by mouth 2 (two) times daily. Take with or immediately following a meal.      . mirabegron ER (MYRBETRIQ) 50 MG TB24 Take 50 mg by mouth every evening.       . mirtazapine (REMERON) 7.5 MG tablet Take 7.5 mg by mouth every evening.       . nitrofurantoin, macrocrystal-monohydrate, (MACROBID) 100 MG capsule Take 100 mg by mouth every evening.      Marland Kitchen oxyCODONE (OXYCONTIN) 10 MG 12 hr tablet Take 10 mg by mouth every 12 (twelve) hours.      . potassium chloride (K-DUR,KLOR-CON) 10 MEQ tablet Take 10 mEq by mouth every other day.      . venlafaxine XR (EFFEXOR-XR) 75 MG 24 hr capsule Take 75 mg by mouth 2 (two) times daily.      . Vitamin D, Ergocalciferol, (DRISDOL) 50000 UNITS CAPS Take 50,000 Units by mouth every 7 (seven) days. wednesdays      . zaleplon (SONATA) 5 MG capsule Take 5 mg by mouth at bedtime.        Home: Home Living Family/patient expects to be discharged to:: Private residence Living Arrangements: Spouse/significant other;Other (Comment) (Companion services caregivers) Available Help at Discharge: Personal care attendant;Available 24 hours/day Type of Home: House Home Access: Stairs to enter [employer] of Steps: 5 Entrance Stairs-Rails: Right;Left Home Layout: One level Home Equipment: Cane - quad;Walker - 4  wheels;Bedside commode;Shower seat;Hand held shower head Additional Comments: pt has 24/7 caregivers that have progressively (A) more with ADLs due to progressive weakness.   Functional History: Prior Function Level of Independence: Independent with assistive device(s);Needs assistance Gait / Transfers Assistance Needed: amb with 4wh walker ADL's / Homemaking Assistance Needed: total (A) at this time due to attention and progressive weakness. Prior to 2 weeks ago could complete ADLs with setup and verbal cues Functional Status:  Mobility: Bed Mobility Overal bed mobility: Needs Assistance Bed Mobility: Supine to Sit;Sidelying to Sit;Rolling Rolling: Mod assist Sidelying to sit: Max assist Supine to sit: Max assist;HOB elevated General bed mobility comments: in recliner upon arrival Transfers Overall transfer level: Needs assistance Equipment used: 4-wheeled walker Transfers: Sit to/from Stand Sit to Stand: Max assist General transfer comment: Pt required anterior weight shift and facilitation at hips for extension. Pt with leaning Left Ambulation/Gait Ambulation/Gait assistance: Max assist;+2 safety/equipment Ambulation Distance (Feet): 20 Feet Assistive device: 4-wheeled walker Gait Pattern/deviations: Scissoring;Shuffle;Staggering left Gait velocity: slowed General Gait Details: Significant L lean with need for constant max assist for support; L hip and knee flexed in stance; tendeing to keep L elbow against chest, guarding Rib fxs    ADL: ADL Overall ADL's : Needs assistance/impaired Eating/Feeding: Minimal assistance;Sitting Eating/Feeding Details (indicate cue type and reason): pt attending to Rt side of tray with some Lt inattentino. pt needs encouragement to initate self feeding. Grooming: Wash/dry face;Minimal assistance;Sitting Lower Body Dressing: Maximal assistance;Sit to/from stand Lower Body Dressing Details (indicate cue type and reason): pt reaching with Rt UE to  fix heel of slide on shoes Toilet Transfer: Maximal assistance;Ambulation Functional mobility during ADLs: Maximal assistance (Hand held (A)) General ADL Comments:  Pt very implusive reaching with RT UE for environmental supports with max v/c for safety. Caregiver present reports pt is easily distracted at  home and needs max cueing for safety. Caregiver reports over the last 2 weeks has become worse. Pt demonstrates Lt awareness at Lt rib cage area. Pt fixated on protecting area with RT UE. pt does not c/o pain to this area during session. pt sitting prematurely with transfer  Cognition: Cognition Overall Cognitive Status: History of cognitive impairments - at baseline Orientation Level: Oriented to person;Disoriented to place;Disoriented to time;Disoriented to situation Cognition Arousal/Alertness: Awake/alert Behavior During Therapy: WFL for tasks assessed/performed Overall Cognitive Status: History of cognitive impairments - at baseline  Blood pressure 165/96, pulse 71, temperature 97.5 F (36.4 C), temperature source Oral, resp. rate 19, height 5\' 4"  (1.626 m), weight 68.3 kg (150 lb 9.2 oz), SpO2 95.00%. Physical Exam  Nursing note and vitals reviewed. Constitutional: She appears well-developed. She is sleeping. She is easily aroused.  HENT:  Head: Normocephalic and atraumatic.  Eyes: Conjunctivae are normal. Pupils are equal, round, and reactive to light.  Neck: Normal range of motion. Neck supple.  Cardiovascular: Normal rate and regular rhythm.   No murmur heard. Respiratory: Effort normal. No respiratory distress. She has no wheezes.  GI: Soft. Bowel sounds are normal. She exhibits no distension. There is no tenderness.  Musculoskeletal: She exhibits no edema and no tenderness.  Neurological: She is alert and easily aroused.   Speech soft but clear. Oriented to self and month. Able to state year with min cues but unable to state place or situation without max cues. Able to  follow simple one step commands. Left sided weakness with apraxia and question emerging tone. LUE is 4/5 prox to distal. LLE is 4-HF 4KE and ADF/PF. Drags left foot a bit with gait but walked to end of all easily.   Skin: Skin is warm and dry.  Psychiatric: Her speech is normal. She is slowed. Cognition and memory are impaired.    No results found for this or any previous visit (from the past 24 hour(s)). Dg Ribs Unilateral W/chest Left  06/25/2014   CLINICAL DATA:  History of fall with injury to the left anterior ribs.  EXAM: LEFT RIBS AND CHEST - 3+ VIEW  COMPARISON:  Chest x-ray 04/19/2014.  FINDINGS: Lung volumes are low. No consolidative airspace disease. No pneumothorax. Probable trace left pleural effusion. No evidence of pulmonary edema. Heart size is normal. Probable aneurysmal dilatation of the thoracic aorta estimated to measure approximately 5 cm at the level of the aortic arch, similar to recent prior studies. Heart size is normal. Spinal cord stimulator in position.  Dedicated views of the left ribs demonstrate 2 subtle nondisplaced fractures of the lateral aspects of the left fifth and sixth ribs. No other acute displaced fractures are noted.  IMPRESSION: 1. Nondisplaced lateral left fifth and sixth rib fractures. No associated pneumothorax. 2. Low lung volumes with trace left pleural effusion. 3. Atherosclerosis with probable aneurysmal dilatation of the ascending thoracic aorta, unchanged compared to prior examinations.   Electronically Signed   By: Trudie Reedaniel  Entrikin M.D.   On: 06/25/2014 14:01   Ct Head Wo Contrast  06/25/2014   CLINICAL DATA:  Fall.  EXAM: CT HEAD WITHOUT CONTRAST  TECHNIQUE: Contiguous axial images were obtained from the base of the skull through the vertex without intravenous contrast.  COMPARISON:  04/21/2014  FINDINGS: Old lacunar infarct is noted in the right basal ganglia, and an old, small cortical infarct is again seen in the posterior right parietal lobe.  Confluent hypodensities in the subcortical and periventricular white matter bilaterally are similar  to the prior study and compatible with advanced chronic small vessel ischemic disease. There is moderate cerebral atrophy. There is no evidence of acute cortical infarct, intracranial hemorrhage, mass, midline shift, or extra-axial fluid collection.  Bilateral ocular lens implants are noted. Mastoid air cells and paranasal sinuses are clear.  IMPRESSION: 1. No evidence of acute intracranial abnormality. 2. Unchanged chronic small vessel ischemic disease.   Electronically Signed   By: Sebastian Ache   On: 06/25/2014 13:51    Assessment/Plan: Diagnosis: likely small right CVA 1. Does the need for close, 24 hr/day medical supervision in concert with the patient's rehab needs make it unreasonable for this patient to be served in a less intensive setting? No 2. Co-Morbidities requiring supervision/potential complications: rib fxs, chronic gait disorder 3. Due to bladder management, bowel management, safety, skin/wound care, disease management, medication administration, pain management and patient education, does the patient require 24 hr/day rehab nursing? Yes 4. Does the patient require coordinated care of a physician, rehab nurse, PT (1-2 hrs/day, 5 days/week) and OT (1-2 hrs/day, 5 days/week) to address physical and functional deficits in the context of the above medical diagnosis(es)? No Addressing deficits in the following areas: balance, endurance, locomotion, strength, transferring, bowel/bladder control, bathing, dressing, feeding, grooming and toileting 5. Can the patient actively participate in an intensive therapy program of at least 3 hrs of therapy per day at least 5 days per week? Potentially 6. The potential for patient to make measurable gains while on inpatient rehab is fair 7. Anticipated functional outcomes upon discharge from inpatient rehab are n/a  with PT, n/a with OT, n/a with  SLP. 8. Estimated rehab length of stay to reach the above functional goals is: n/a 9.  10. Does the patient have adequate social supports to accommodate these discharge functional goals? Potentially 11. Anticipated D/C setting: Home 12. Anticipated post D/C treatments: HH therapy and Outpatient therapy 13. Overall Rehab/Functional Prognosis: good  RECOMMENDATIONS: This patient's condition is appropriate for continued rehabilitative care in the following setting: [facility] Patient has agreed to participate in recommended program. Yes Note that insurance prior authorization may be required for reimbursement for recommended care.  Comment: Pt with baseline gait disorder. She and husband have 24 hour care. She has improved over last 24 hours and would prefer to go home as well.   Ranelle Oyster, MD, [facility] Physical Medicine & Rehabilitation    06/27/2014

## 2014-06-27 NOTE — Progress Notes (Signed)
Physical Therapy Treatment Patient Details Name: YVETT ROSSEL MRN: [id number] DOB: [birth year] Today's Date: 06/27/2014    History of Present Illness  80 yo female admitted due to Lt side weakness and progressive weakness over 2 weeks. Pt with fall and now Lt Rib fx 5th and 6th.  Ct (-)  PMH: frequent falls, chronic pain, neuropathy, dementia, Lt residual weakness, nerve stimulator     PT Comments    Progressing steadily.  Still with mild/mod list to the left, but less limiting on gait.  Pt having trouble with bed mobility given rib fracture.  Will benefit from HHPT to address these issues.   Follow Up Recommendations  Home health PT (pt has made improvements and more approp. for HHPT, pt agree)     Equipment Recommendations  None recommended by PT (may decide on a hospital bed soon.)    Recommendations for Other Services       Precautions / Restrictions Precautions Precautions: Fall Restrictions Weight Bearing Restrictions: No    Mobility  Bed Mobility                  Transfers Overall transfer level: Needs assistance Equipment used: 4-wheeled walker Transfers: Sit to/from Stand;Stand Pivot Transfers Sit to Stand: Min assist Stand pivot transfers: Min assist       General transfer comment: assist for coming forward and stability assist  Ambulation/Gait Ambulation/Gait assistance: Min assist Ambulation Distance (Feet): 180 Feet Assistive device: 4-wheeled walker Gait Pattern/deviations: Step-through pattern Gait velocity: slowed   General Gait Details: pt able to attain short, but each step length with mild to mod list left with some mild ipsilateral hip drop.  More assist needed as the distance increased.   Stairs            Wheelchair Mobility    Modified Rankin (Stroke Patients Only) Modified Rankin (Stroke Patients Only) Pre-Morbid Rankin Score: Moderately severe disability Modified Rankin: Moderately severe disability     Balance  Overall balance assessment: Needs assistance Sitting-balance support: Feet supported;No upper extremity supported Sitting balance-Leahy Scale: Fair Sitting balance - Comments: lists mildly Left but can maintain upright without UE assist   Standing balance support: No upper extremity supported Standing balance-Leahy Scale: Poor Standing balance comment: mild to mod list left                    Cognition Arousal/Alertness: Awake/alert Behavior During Therapy: WFL for tasks assessed/performed Overall Cognitive Status: History of cognitive impairments - at baseline                      Exercises      General Comments        Pertinent Vitals/Pain Some rib pain.    Home Living                      Prior Function            PT Goals (current goals can now be found in the care plan section) Acute Rehab PT Goals PT Goal Formulation: With patient Time For Goal Achievement: 07/10/14 Potential to Achieve Goals: Good Progress towards PT goals: Progressing toward goals    Frequency  Min 3X/week    PT Plan Current plan remains appropriate    Co-evaluation             End of Session   Activity Tolerance: Patient tolerated treatment well Patient left: in bed;with call bell/phone within reach;with family/visitor  present     Time: [phone] (back in to help pt get into bed and reposition) PT Time Calculation (min): 27 min  Charges:  $Gait Training: 8-22 mins $Therapeutic Activity: 8-22 mins                    G Codes:      Jakaden Ouzts, Eliseo GumKenneth V 06/27/2014, 12:48 PM 06/27/2014  [place] BingKen Jyla Hopf, PT [phone]  (pager)

## 2014-06-27 NOTE — Progress Notes (Signed)
[facility] PROGRESS NOTE  Chelsea Lynch ZOX:[id number] DOB: 26-Jan-1925 DOA: 06/25/2014 PCP: Londell MohPHARR,WALTER DAVIDSON, MD  Assessment/Plan: 1. Acute on Chr L Arm weakness/Hemiparesis and Gen weakness for the last 2 weeks ( recurrent problem) - CT head -ve, MRI cannot be done (has a spinal stimulator). She has chronic weakness in the left arm but family has noticed increased weakness and deconditioning over the last few weeks, for now continue aspirin and statin for secondary prevention. UA, B12 folic and ammonia levels wnl. Recent TSH nl.  -continue to minimize narcotics and sedatives.  - Dr Marilynn LatinoSingh Called and explained to the daughter in detail that this might be out of her aging process and she might require placement at this time, he statesd he had given them similar advice a few weeks ago. -seen by Neuro, they do not recommend any further eval>> ASA changed to plavix -consult CIR as per PT eval.  2. Hypothyroidism. Continue home dose Synthroid.  Lab Results   Component  Value  Date    TSH  2.380  04/20/2014    3. Mild early dementia. On Aricept along with Remeron continue.  4. Essential hypertension on Toprol.  5. Proximal atrial fibrillation. On beta blocker, not a candidate for anticoagulation due to fall risk, aspirin to be continued   Code Status:full Family Communication: care givers at bedside Disposition Plan: to rehab pendin CIR eval   Consultants:  neuro  Procedures: none  Antibiotics:  none   HPI/Subjective: Pt sitting up in chair, states L. Arm still weak  Objective: Filed Vitals:   06/27/14 1425  BP: 142/83  Pulse: 87  Temp: 97.6 F (36.4 C)  Resp: 18    Intake/Output Summary (Last 24 hours) at 06/27/14 1752 Last data filed at 06/26/14 2200  Gross per 24 hour  Intake   1192 ml  Output      0 ml  Net   1192 ml   Filed Weights   06/26/14 0626  Weight: 68.3 kg (150 lb 9.2 oz)    Exam:  General: alert & answers simple questions appropriately  In NAD Cardiovascular: RRR, nl S1 s2 Respiratory: CTAB Abdomen: soft +BS NT/ND, no masses palpable Extremities: No cyanosis and no edema  Neuro : L.sided strength 4/5  with increased tone in L. Arm, Rside wnl    Data Reviewed: Basic Metabolic Panel:  Recent Labs Lab 06/25/14 1305  NA 142  K 4.2  CL 102  CO2 27  GLUCOSE 88  BUN 24*  CREATININE 1.13*  CALCIUM 9.4   Liver Function Tests:  Recent Labs Lab 06/25/14 1305  AST 16  ALT 12  ALKPHOS 95  BILITOT 0.4  PROT 7.2  ALBUMIN 3.9   No results found for this basename: LIPASE, AMYLASE,  in the last 168 hours  Recent Labs Lab 06/25/14 1917  AMMONIA 18   CBC:  Recent Labs Lab 06/25/14 1305  WBC 6.8  NEUTROABS 4.4  HGB 13.3  HCT 40.8  MCV 92.3  PLT 205   Cardiac Enzymes:  Recent Labs Lab 06/25/14 1851  CKTOTAL 34   BNP (last 3 results) No results found for this basename: PROBNP,  in the last 8760 hours CBG: No results found for this basename: GLUCAP,  in the last 168 hours  No results found for this or any previous visit (from the past 240 hour(s)).   Studies: No results found.  Scheduled Meds: . atorvastatin  10 mg Oral q1800  . clopidogrel  75 mg Oral Daily  .  docusate sodium  200 mg Oral QPM  . donepezil  5 mg Oral QHS  . enoxaparin (LOVENOX) injection  40 mg Subcutaneous Q24H  . fish oil-omega-3 fatty acids  2 g Oral q morning - 10a  . gabapentin  400 mg Oral QHS  . levothyroxine  75 mcg Oral QAC breakfast  . metoprolol succinate  25 mg Oral BID  . mirtazapine  7.5 mg Oral QPM  . nitrofurantoin (macrocrystal-monohydrate)  100 mg Oral QPM  . OxyCODONE  10 mg Oral Q12H  . sodium chloride  3 mL Intravenous Q12H  . venlafaxine XR  75 mg Oral BID   Continuous Infusions: . sodium chloride 50 mL/hr at 06/27/14 0805    Principal Problem:   Weakness Active Problems:   Unspecified hypothyroidism   PAF (paroxysmal atrial fibrillation)   Left rib fracture   Chronic pain syndrome    Frequent falls   Dementia   Neuropathy   Insomnia   Chronic constipation    Time spent: 25    [facility]  [facility] Pager [phone]. If 7PM-7AM, please contact night-coverage at [email or web address], password [facility] 06/27/2014, 5:52 PM  LOS: 2 days

## 2014-06-27 NOTE — Clinical Documentation Improvement (Signed)
Possible Conditions _______________Hemiplegia _______________Hemiparesis _______________Dominant side  Vs non-dominant  Other Not able to determine  Risk Factors:Falls,Neuropathy, CVA, Spinal stenosis,   Nondisplaced lateral left fifth and sixth rib fractures.   Sign & Symptoms:ED note:" history of CVA affecting left side of body, deficits improving,mild left-sided facial droop  Trace weakness L upper and lower extremity, flexors and extensors H&P:"Neurologic: CNII-XII intact, left arm strength 4/5. Slight pronator drift on left Grips normal lower extremities 5/5  Weakness: particularly left arm, but also with worsening gait." OT eval: pt unable to push up with LT UE  LUE Coordination: decreased fine motor;decreased gross motor PT eval: General Gait Details: Significant L lean with need for constant max assist for support Overall balance: poor PT Diagnosis  Difficulty walking;Abnormality of gait;Generalized weakness;Acute pain;Hemiplegia non-dominant side   Treatment: OT to follow acutely due to LT side weakness, Lt inattention to complete adl retraining and dynamic balance. PT Acute Rehab PT Goals min 3x/ week   Thank you,  Chelsea GaussGarnet C Ondria Lynch ,RN Clinical Documentation Specialist:  [phone]  [facility]- [facility]

## 2014-06-27 NOTE — Progress Notes (Signed)
Inpatient Rehabilitation  I note completion of PRM consult by Dr. Riley KillSwartz with recommendation for HH/OP therapies.  Also note pt. prefers to DC home.  Will sign off.  Please call if questions.  Weldon PickingSusan Alyvia Derk PT Inpatient Rehab Admissions Coordinator Cell [phone] Office [phone]

## 2014-06-28 MED ORDER — CLOPIDOGREL BISULFATE 75 MG PO TABS: 75.0000 mg | ORAL_TABLET | Freq: Every day | ORAL | Status: DC

## 2014-06-28 MED ORDER — KETOROLAC TROMETHAMINE 30 MG/ML IJ SOLN
30.0000 mg | Freq: Once | INTRAMUSCULAR | Status: AC
  Administered 2014-06-28: 30 mg via INTRAVENOUS
  Filled 2014-06-28: qty 1

## 2014-06-28 MED ORDER — GABAPENTIN 400 MG PO CAPS: 400.0000 mg | ORAL_CAPSULE | Freq: Every day | ORAL | Status: DC

## 2014-06-28 NOTE — ED Provider Notes (Signed)
Medical screening examination/treatment/procedure(s) were performed by non-physician practitioner and as supervising physician I was immediately available for consultation/collaboration.   EKG Interpretation None        Laray AngerKathleen M Yahshua Thibault, DO 06/28/14 1550

## 2014-06-28 NOTE — Care Management Note (Signed)
  Page 2 of 2   06/28/2014     10:33:50 AM CARE MANAGEMENT NOTE 06/28/2014  Patient:  Chelsea Lynch,Chelsea Lynch   Account Number:  [id number]  Date Initiated:  06/28/2014  Documentation initiated by:  Ronny FlurryWILE,Rockie Vawter  Subjective/Objective Assessment:     Action/Plan:   Anticipated DC Date:     Anticipated DC Plan:  HOME W HOME HEALTH SERVICES         Choice offered to / List presented to:  C-4 Adult Children   DME arranged  HOSPITAL BED      DME agency  [facility]     [facility]  HH[facility] RN  HH-2 PT  HH-3 OT      [facility]   Status of service:  Completed, signed off Medicare Important Message given?  YES (If response is "NO", the following Medicare IM given date fields will be blank) Date Medicare IM given:  06/28/2014 Medicare IM given by:  Ronny FlurryWILE,Kahiau Schewe Date Additional Medicare IM given:   Additional Medicare IM given by:    Discharge Disposition:    Per UR Regulation:    If discussed at Long Length of Stay Meetings, dates discussed:    Comments:  06-28-14 Spoke with patient's daughter Chelsea BernhardtSuzanne Lynch , cell [phone]  home [phone]. Ms Chelsea Lynch does want to take her mother home with home health , [facility] and would [facility] , and is requesting PT Matt and OT . [facility] aware and suggested adding HHRN , same done .  Ms Chelsea Lynch confirmed she has 24 hour / day caregivers for both of her parents . She would like a hospital bed , bed alarm and over the bed table.  Spoke with Jermaine at [facility] . Over the bed table is a private pay item , costs $150.00 . Ms Chelsea Lynch aware and will provide [facility] with credit card number .  Jermaine explained Ms Chelsea Lynch will have to go to medical supply store to buy bed alarm .  Ms Chelsea Lynch aware and agreeable.  Explained IM to Ms Chelsea Lynch over phone , she voiced understanding and aware copy is in her mother's room.  Ronny FlurryHeather Madhav Mohon RN BSN [phone]

## 2014-06-28 NOTE — Discharge Summary (Addendum)
Physician Discharge Summary  Chelsea Lynch GNF:[id number] DOB: [birth year] DOA: 06/25/2014  PCP: [name], MD  Admit date: 06/25/2014 Discharge date: 06/28/2014  Time spent: >33minutes  Recommendations for Outpatient Follow-up:  Follow-up Information   Follow up with [name], MD. (in 1-2weeks, call for appt upon discharge)    Specialty:  Internal Medicine   Contact information:   [address] [place] [address] [phone]       Please follow up. (Neurologist, as scheduled)        Discharge Diagnoses:  Principal Problem:   Weakness Active Problems:   Unspecified hypothyroidism   PAF (paroxysmal atrial fibrillation)   Left rib fracture   Chronic pain syndrome   Frequent falls   Dementia   Neuropathy   Insomnia   Chronic constipation   Discharge Condition: IMPROVED/STABLE  Diet recommendation: Low sodium heart healthy  Filed Weights   06/26/14 0626  Weight: 68.3 kg (150 lb 9.2 oz)    History of present illness:  Patient is an 80 year old female With h/o previous stroke, frequent falls, chronic pain, neuropathy, dementia brought to the emergency room by family for increasing weakness over the past week. She had been working with physical therapy prior and was able to anmbulate with assistance and walker. Now she has difficulty even standing. He also had noticed that her left side seems weaker than usual. She fell a few days ago and since then has had severe left-sided chest wall pain. Daughter was concerned that she may have suffered a new stroke, or has a urinary tract infection. She reportedly had a stroke in the past with left-sided weakness but this resolved. She has had no dysuria, fevers, chills, cough, vomiting or diarrhea. Her appetite has been good. No recent medication changes. Had a recent admission for similar symptoms and found to have a urinary tract infection. In the emergency room, x-ray showed left fifth and  sixth rib fractures. workup including urinalysis, CAT scan brain negative. She has a nerve stimulator implanted and is unable to get an MRI. She was admitted for further evaluation and management.   Hospital Course:  1. Acute on Chronic  L Arm weakness/Hemiparesis and Gen weakness for the last 2 weeks ( recurrent problem) -  -As discussed above upon admission CT head which came back negative, MRI could not be done (patient has a spinal stimulator). She has chronic weakness in the left arm but family has noticed increased weakness and deconditioning over the last few weeks, she was continued on statin. Other workup included UA, B12 folic and ammonia levels wnl. Recent TSH nl.  - narcotics and sedatives were minimized-gabapentin was decreased to 400 each bedtime as well.  -Neurology was consulted and saw patient and recommended changing her from aspirin to Plavix and this was done -Given that she just had recent workup for CVA no further evaluation was recommended the neuro - Dr Marilynn Latino and explained to the daughter in detail that this might be out of her aging process and she might require placement at this time, he stated he had given them similar advice a few weeks ago.  -PT saw patient and recommended CIR, and they evaluated patient and recommended home health services patient also states she prefers to go home  -She is to followup with neuro outpatient 2. Hypothyroidism. Continue home dose Synthroid.  Lab Results   Component  Value  Date    TSH  2.380  04/20/2014   3. Mild early dementia. On  Aricept along with Remeron continue.  4. Essential hypertension on Toprol.  5. Proximal atrial fibrillation. On beta blocker, not a candidate for anticoagulation due to fall risk, aspirin was changed to Plavix as above per neuro   Procedures:  None  Consultations:  Neurology  Discharge Exam: Filed Vitals:   06/28/14 1248  BP: 157/92  Pulse: 72  Temp: 98 F (36.7 C)  Resp: 17   Exam:   General: alert & answers simple questions appropriately In NAD  Cardiovascular: RRR, nl S1 s2  Respiratory: CTAB  Abdomen: soft +BS NT/ND, no masses palpable  Extremities: No cyanosis and no edema  Neuro : L.sided strength 4/5 with increased tone in L. Arm, Rside wnl    Discharge Instructions You were cared for by a hospitalist during your hospital stay. If you have any questions about your discharge medications or the care you received while you were in the hospital after you are discharged, you can call the unit and asked to speak with the hospitalist on call if the hospitalist that took care of you is not available. Once you are discharged, your primary care physician will handle any further medical issues. Please note that NO REFILLS for any discharge medications will be authorized once you are discharged, as it is imperative that you return to your primary care physician (or establish a relationship with a primary care physician if you do not have one) for your aftercare needs so that they can reassess your need for medications and monitor your lab values.  Discharge Instructions   Diet - low sodium heart healthy    Complete by:  As directed      Increase activity slowly    Complete by:  As directed             Medication List    STOP taking these medications       aspirin 325 MG tablet      TAKE these medications       acetaminophen 500 MG tablet  Commonly known as:  TYLENOL  Take 1,000 mg by mouth every 6 (six) hours as needed for mild pain.     atorvastatin 10 MG tablet  Commonly known as:  LIPITOR  Take 1 tablet (10 mg total) by mouth daily at 6 PM.     Biotin 5000 MCG Caps  Take 5,000 mcg by mouth daily.     bisacodyl 10 MG suppository  Commonly known as:  DULCOLAX  Place 10 mg rectally daily as needed for mild constipation or moderate constipation.     clopidogrel 75 MG tablet  Commonly known as:  PLAVIX  Take 1 tablet (75 mg total) by mouth daily.      docusate sodium 100 MG capsule  Commonly known as:  COLACE  Take 200 mg by mouth every evening.     donepezil 5 MG tablet  Commonly known as:  ARICEPT  Take 5 mg by mouth at bedtime.     fish oil-omega-3 fatty acids 1000 MG capsule  Take 2 g by mouth every morning.     furosemide 20 MG tablet  Commonly known as:  LASIX  Take 20-40 mg by mouth every morning. DEPENDING ON FLUID RETENTION     gabapentin 400 MG capsule  Commonly known as:  NEURONTIN  Take 400 mg by mouth each bedtime.     levothyroxine 75 MCG tablet  Commonly known as:  SYNTHROID, LEVOTHROID  Take 1 tablet (75 mcg total) by mouth daily before  breakfast.     LIDODERM 5 %  Generic drug:  lidocaine  Place 1 patch onto the skin daily as needed (for pain).     metoprolol succinate 50 MG 24 hr tablet  Commonly known as:  TOPROL-XL  Take 25 mg by mouth 2 (two) times daily. Take with or immediately following a meal.     mirtazapine 7.5 MG tablet  Commonly known as:  REMERON  Take 7.5 mg by mouth every evening.     MYRBETRIQ 50 MG Tb24 tablet  Generic drug:  mirabegron ER  Take 50 mg by mouth every evening.     nitrofurantoin (macrocrystal-monohydrate) 100 MG capsule  Commonly known as:  MACROBID  Take 100 mg by mouth every evening.     oxyCODONE 10 MG 12 hr tablet  Commonly known as:  OXYCONTIN  Take 10 mg by mouth every 12 (twelve) hours.     potassium chloride 10 MEQ tablet  Commonly known as:  K-DUR,KLOR-CON  Take 10 mEq by mouth every other day.     venlafaxine XR 75 MG 24 hr capsule  Commonly known as:  EFFEXOR-XR  Take 75 mg by mouth 2 (two) times daily.     Vitamin D (Ergocalciferol) 50000 UNITS Caps capsule  Commonly known as:  DRISDOL  Take 50,000 Units by mouth every 7 (seven) days. wednesdays     zaleplon 5 MG capsule  Commonly known as:  SONATA  Take 5 mg by mouth at bedtime.       Allergies  Allergen Reactions  . Clarithromycin Other (See Comments)    Unknown reaction    .  Codeine Nausea And Vomiting  . Tequin [Gatifloxacin] Other (See Comments)    Unknown reaction.  Tolerates Levaquin.  Marland Kitchen. Penicillins Rash       Follow-up Information   Follow up with Londell MohPHARR,WALTER DAVIDSON, MD. (in 1-2weeks, call for appt upon discharge)    Specialty:  Internal Medicine   Contact information:   [address] [place] [id number] [phone]       Please follow up. (Neurologist, as scheduled)        The results of significant diagnostics from this hospitalization (including imaging, microbiology, ancillary and laboratory) are listed below for reference.    Significant Diagnostic Studies: Dg Ribs Unilateral W/chest Left  06/25/2014   CLINICAL DATA:  History of fall with injury to the left anterior ribs.  EXAM: LEFT RIBS AND CHEST - 3+ VIEW  COMPARISON:  Chest x-ray 04/19/2014.  FINDINGS: Lung volumes are low. No consolidative airspace disease. No pneumothorax. Probable trace left pleural effusion. No evidence of pulmonary edema. Heart size is normal. Probable aneurysmal dilatation of the thoracic aorta estimated to measure approximately 5 cm at the level of the aortic arch, similar to recent prior studies. Heart size is normal. Spinal cord stimulator in position.  Dedicated views of the left ribs demonstrate 2 subtle nondisplaced fractures of the lateral aspects of the left fifth and sixth ribs. No other acute displaced fractures are noted.  IMPRESSION: 1. Nondisplaced lateral left fifth and sixth rib fractures. No associated pneumothorax. 2. Low lung volumes with trace left pleural effusion. 3. Atherosclerosis with probable aneurysmal dilatation of the ascending thoracic aorta, unchanged compared to prior examinations.   Electronically Signed   By: Trudie Reedaniel  Entrikin M.D.   On: 06/25/2014 14:01   Ct Head Wo Contrast  06/25/2014   CLINICAL DATA:  Fall.  EXAM: CT HEAD WITHOUT CONTRAST  TECHNIQUE: Contiguous axial images were  obtained from the base of the skull  through the vertex without intravenous contrast.  COMPARISON:  04/21/2014  FINDINGS: Old lacunar infarct is noted in the right basal ganglia, and an old, small cortical infarct is again seen in the posterior right parietal lobe. Confluent hypodensities in the subcortical and periventricular white matter bilaterally are similar to the prior study and compatible with advanced chronic small vessel ischemic disease. There is moderate cerebral atrophy. There is no evidence of acute cortical infarct, intracranial hemorrhage, mass, midline shift, or extra-axial fluid collection.  Bilateral ocular lens implants are noted. Mastoid air cells and paranasal sinuses are clear.  IMPRESSION: 1. No evidence of acute intracranial abnormality. 2. Unchanged chronic small vessel ischemic disease.   Electronically Signed   By: Sebastian Ache   On: 06/25/2014 13:51    Microbiology: No results found for this or any previous visit (from the past 240 hour(s)).   Labs: Basic Metabolic Panel:  Recent Labs Lab 06/25/14 1305  NA 142  K 4.2  CL 102  CO2 27  GLUCOSE 88  BUN 24*  CREATININE 1.13*  CALCIUM 9.4   Liver Function Tests:  Recent Labs Lab 06/25/14 1305  AST 16  ALT 12  ALKPHOS 95  BILITOT 0.4  PROT 7.2  ALBUMIN 3.9   No results found for this basename: LIPASE, AMYLASE,  in the last 168 hours  Recent Labs Lab 06/25/14 1917  AMMONIA 18   CBC:  Recent Labs Lab 06/25/14 1305  WBC 6.8  NEUTROABS 4.4  HGB 13.3  HCT 40.8  MCV 92.3  PLT 205   Cardiac Enzymes:  Recent Labs Lab 06/25/14 1851  CKTOTAL 34   BNP: BNP (last 3 results) No results found for this basename: PROBNP,  in the last 8760 hours CBG: No results found for this basename: GLUCAP,  in the last 168 hours     Signed:  Kela Millin  [facility] 06/28/2014, 5:28 PM

## 2014-06-28 NOTE — Progress Notes (Signed)
Arlington CalixMaxine J Lahaie to be D/C'd Home per MD order.  Discussed with the patient and all questions fully answered.  VSS, Skin clean, dry and intact without evidence of skin break down, no evidence of skin tears noted.  IV catheter discontinued intact. Site without signs and symptoms of complications. Dressing and pressure applied.  An After Visit Summary was printed and given to the patient.  D/c education completed with patient/family including follow up instructions, medication list, d/c activities limitations if indicated, with other d/c instructions as indicated by MD - patient able to verbalize understanding, all questions fully answered. Prescription for Plavix given to patient's daughter.    Patient instructed to return to ED, call 911, or call MD for any changes in condition.   Patient escorted via stretcher, and D/C home via EMS transport.  Family aware that there may be charge for EMS transport and would like to proceed anyway in the interest of patient's safety.  Burt EkCook, Dagen Beevers D 06/28/2014 6:02 PM

## 2014-06-28 NOTE — Progress Notes (Signed)
CSW arranged ambulance transport home for pt via [facility].

## 2014-06-28 NOTE — Progress Notes (Signed)
I have read and agree with this note.   Time in/out: [id number] Total time: 48 minutes (3SC)  Ignacia Palmaathy Raynesha Tiedt, OTR/L [phone]

## 2014-06-28 NOTE — Progress Notes (Signed)
Occupational Therapy Treatment and Discharge Patient Details Name: Chelsea Lynch MRN: [id number] DOB: 11/22/1925 Today's Date: 06/28/2014    History of present illness  80 yo female admitted due to Lt side weakness and progressive weakness over 2 weeks. Pt with fall and now Lt Rib fx 5th and 6th.  Ct (-)  PMH: frequent falls, chronic pain, neuropathy, dementia, Lt residual weakness, nerve stimulator    OT comments  Focus of session was on all aspects of toileting and grooming. All education has been completed and pt will have 24/7 supervision and assistance from home health aides to assist with ADL/IADLs at home. Pt would benefit from f/u of HHOT to maximize independence and reduce caregiver burden at home, therefore no further acute OT is needed. We will sign off.  Follow Up Recommendations  Supervision/Assistance - 24 hour;Home health OT    Equipment Recommendations  None recommended by OT       Precautions / Restrictions Precautions Precautions: Fall       Mobility Bed Mobility               General bed mobility comments: Pt sitting on toilet upon OT tx.  Transfers Overall transfer level: Needs assistance Equipment used: 2 person hand held assist Transfers: Sit to/from Stand Sit to Stand: Mod assist;+2 physical assistance         General transfer comment: A to stand and to center her body.    Balance Overall balance assessment: Needs assistance Sitting-balance support: Feet supported;No upper extremity supported Sitting balance-Leahy Scale: Fair     Standing balance support: Single extremity supported;During functional activity Standing balance-Leahy Scale: Poor                     ADL Overall ADL's : Needs assistance/impaired     Grooming: Wash/dry face;Wash/dry hands;Oral care;Standing;Moderate assistance (+2 to keep pt in upright position)                   Toilet Transfer: Ambulation;+2 for physical assistance;Regular Toilet;Grab  bars;Moderate assistance   Toileting- Clothing Manipulation and Hygiene: +2 for physical assistance;Sit to/from stand;Moderate assistance       Functional mobility during ADLs: Moderate assistance;+2 for physical assistance (HHA) General ADL Comments: Pt leans heavily to her L side while standing, walking and sitting. Pt stood at the sink for 3 grooming tasks but required max VC to center her body and keep her chest up.                Cognition   Behavior During Therapy: WFL for tasks assessed/performed Overall Cognitive Status: History of cognitive impairments - at baseline       Memory: Decreased short-term memory (Pt kept repeating comments/questions to her aide that had already been answered during tx.)                        Pertinent Vitals/ Pain       No c/o pain during tx.         Frequency Min 2X/week     Progress Toward Goals  OT Goals(current goals can now be found in the care plan section)  Progress towards OT goals: Progressing toward goals  Acute Rehab OT Goals Patient Stated Goal: to go see her husband OT Goal Formulation: With patient Time For Goal Achievement: 07/05/14 Potential to Achieve Goals: Good  Plan Discharge plan remains appropriate          Activity Tolerance  Patient limited by fatigue   Patient Left in chair;with call bell/phone within reach;with family/visitor present;with nursing/sitter in room           Time:  -     Charges:    Maurene CapesKeene, Fallyn Munnerlyn 06/28/2014, 5:02 PM

## 2014-07-04 ENCOUNTER — Ambulatory Visit: Payer: Medicare Other | Admitting: Neurology

## 2014-07-24 ENCOUNTER — Other Ambulatory Visit: Payer: Self-pay | Admitting: Neurology

## 2014-07-24 ENCOUNTER — Other Ambulatory Visit: Payer: Self-pay | Admitting: Internal Medicine

## 2014-08-30 ENCOUNTER — Telehealth: Payer: Self-pay | Admitting: Cardiology

## 2014-08-31 NOTE — Telephone Encounter (Signed)
Close encounter 

## 2014-09-18 ENCOUNTER — Other Ambulatory Visit: Payer: Self-pay | Admitting: Cardiology

## 2014-09-19 NOTE — Telephone Encounter (Signed)
Rx was sent to pharmacy electronically. 

## 2014-10-08 ENCOUNTER — Encounter: Payer: Self-pay | Admitting: Cardiology

## 2014-10-08 ENCOUNTER — Ambulatory Visit (INDEPENDENT_AMBULATORY_CARE_PROVIDER_SITE_OTHER): Payer: Medicare Other | Admitting: Cardiology

## 2014-10-08 VITALS — BP 122/92 | HR 73 | Ht 64.0 in | Wt 143.2 lb

## 2014-10-08 DIAGNOSIS — I480 Paroxysmal atrial fibrillation: Secondary | ICD-10-CM

## 2014-10-08 DIAGNOSIS — I714 Abdominal aortic aneurysm, without rupture, unspecified: Secondary | ICD-10-CM

## 2014-10-08 DIAGNOSIS — I471 Supraventricular tachycardia: Secondary | ICD-10-CM

## 2014-10-08 DIAGNOSIS — I639 Cerebral infarction, unspecified: Secondary | ICD-10-CM

## 2014-10-08 NOTE — Patient Instructions (Signed)
NO CHANGE IN MEDICATIONS   Your physician wants you to follow-up in 6 MONTHS DR HARDING.  You will receive a reminder letter in the mail two months in advance. If you don't receive a letter, please call our office to schedule the follow-up appointment.

## 2014-10-14 ENCOUNTER — Encounter: Payer: Self-pay | Admitting: Cardiology

## 2014-10-14 DIAGNOSIS — I714 Abdominal aortic aneurysm, without rupture, unspecified: Secondary | ICD-10-CM | POA: Insufficient documentation

## 2014-10-14 NOTE — Assessment & Plan Note (Signed)
No further episodes since she was in septic shock once before. Is on beta blocker.

## 2014-10-14 NOTE — Assessment & Plan Note (Signed)
Was about 4.5 x 4.2 cm by follow-up Doppler in January, a CT scan of the abdomen which suggested as 4.9 cm.  The plan is follow-up with your annual or biannual Dopplers unless enlarging.

## 2014-10-14 NOTE — Assessment & Plan Note (Signed)
Based on neurology recommendations, we'll continue with Plavix. Her cardiovascular risk she remains on statin as well.

## 2014-10-14 NOTE — Progress Notes (Signed)
PCP: Londell MohPHARR,WALTER DAVIDSON, MD  Clinic Note: Chief Complaint  Patient presents with  . Follow-up    pt denies chest pain and swelling. pt states that she does experience sob    HPI: Chelsea Lynch is a 80 y.o. female with a cardiovascular problem list as noted below who presents today for one-year followup for her atrial fibrillation and atrial tachycardia. She is on Plavix as opposed to full anticoagulation for her age did because of unsteady gait and previous falls. Unfortunately, she was last admitted to the hospital for a stroke back in July.she was very sleep needed for increasing weakness and inability to move/wall. She is very unsteady as far standing.she was changed from aspirin to Plavix by neurology.  She is finally now starting to get back to some type of activity.  1. PAF (paroxysmal atrial fibrillation)   2. SVT (supraventricular tachycardia)   3. Acute CVA (cerebrovascular accident)   4. AAA (abdominal aortic aneurysm) without rupture     Prior Cardiac Evaluation and Past Surgical History: Past Surgical History  Procedure Laterality Date  . Nm myocar perf wall motion  June 2014    No ischemia or infarction, mild apical breast attenuation, EF roughly 70%  . Doppler echocardiography  03/2013; 04/20/2014    A. Normal EF 60-65%. Mild/moderate aortic regurgitation, mildly dilated RV with moderate pulmonary hypertension; b. EF 60-65%, Gr 1 DD, Mod Ao Sclerosis - no stenosis, Mild AI, normal PAP.  Marland Kitchen. Abdomnal aorta  02/25/2012    mid abd. aorta  mid segment diltaton 3.93 x 4.55 cm greatest diameter.mild amt atherosclerosis without evidence of sigificant diamter reduction  . Lower arterial extremities doppler  02/19/2011    right abi 0.98,left 0.97  . Renal doppler  02/12/2011    abd aorta prox 2.6 x 3.1 cm,mid 4.1 x 3.6 cm, distal 2.3 x 3.4 cm, right renal artery 1-59% left renal artery norm. ,both renal size normal  . Renal doppler  02/11/2010    infrarenal AAA mearsuring  3.6 x 3.7 cm   Interval History: Mr. Chelsea BathLong time since her last saw her, she will he does not notice whether or not she is or is not in atrial fibrillation. During her last visit, we decided not to anticoagulate. It seems like after discussions remained in the hospital with her having a history of PAF and now possible stroke with neurology only recommended Plavix. She is starting to get some restraint back but still somewhat unsteady. She is always short of breath if she tries to do something but otherwise denies any resting dyspnea. No chest tightness or pressure with rest. She is not doing enough to notice it occurs with exertion.  No PND, orthopnea mild lower extremity edema edema. No sensation of palpitationsa rapid heartbeats. She does note occasionally (now with less frequency than in the last couple months) positional, lightheadedness, dizziness. She does have residual from her hospital stay, but denies any syncope/near syncope.  No recurrent TIA/amaurosis fugax symptoms.  ROS: A comprehensive was performed. Review of Systems  Constitutional: Negative for fever, chills and malaise/fatigue.  HENT: Negative for nosebleeds.   Respiratory: Negative for cough, sputum production and wheezing.   Cardiovascular: Negative for claudication.       Per history of present illness  Gastrointestinal: Negative for blood in stool and melena.  Genitourinary: Negative for hematuria.  Musculoskeletal: Positive for joint pain and falls.  Neurological: Positive for dizziness and weakness. Negative for sensory change, speech change, focal weakness, seizures and loss  of consciousness.  Psychiatric/Behavioral: Negative for depression.    Current Outpatient Prescriptions on File Prior to Visit  Medication Sig Dispense Refill  . acetaminophen (TYLENOL) 500 MG tablet Take 1,000 mg by mouth every 6 (six) hours as needed for mild pain.    Marland Kitchen. atorvastatin (LIPITOR) 10 MG tablet Take 1 tablet (10 mg total) by mouth daily  at 6 PM. 30 tablet 1  . Biotin 5000 MCG CAPS Take 5,000 mcg by mouth daily.    . bisacodyl (DULCOLAX) 10 MG suppository Place 10 mg rectally daily as needed for mild constipation or moderate constipation.    . clopidogrel (PLAVIX) 75 MG tablet Take 1 tablet (75 mg total) by mouth daily. 30 tablet 0  . docusate sodium (COLACE) 100 MG capsule Take 200 mg by mouth every evening.     . donepezil (ARICEPT) 5 MG tablet Take 5 mg by mouth at bedtime.    . fish oil-omega-3 fatty acids 1000 MG capsule Take 2 g by mouth every morning.     . furosemide (LASIX) 20 MG tablet TAKE 1-2 TABLETS DAILY AS DIRECTED. 60 tablet 0  . gabapentin (NEURONTIN) 400 MG capsule Take 1 capsule (400 mg total) by mouth at bedtime.    Marland Kitchen. levothyroxine (SYNTHROID, LEVOTHROID) 75 MCG tablet Take 1 tablet (75 mcg total) by mouth daily before breakfast. 30 tablet 0  . metoprolol succinate (TOPROL-XL) 50 MG 24 hr tablet Take 25 mg by mouth 2 (two) times daily. Take with or immediately following a meal.    . mirabegron ER (MYRBETRIQ) 50 MG TB24 Take 50 mg by mouth every evening.     . nitrofurantoin, macrocrystal-monohydrate, (MACROBID) 100 MG capsule Take 100 mg by mouth every evening.    Marland Kitchen. oxyCODONE (OXYCONTIN) 10 MG 12 hr tablet Take 10 mg by mouth every 12 (twelve) hours.    Marland Kitchen. venlafaxine XR (EFFEXOR-XR) 75 MG 24 hr capsule Take 75 mg by mouth 2 (two) times daily.    . Vitamin D, Ergocalciferol, (DRISDOL) 50000 UNITS CAPS Take 50,000 Units by mouth every 7 (seven) days. wednesdays    . zaleplon (SONATA) 5 MG capsule Take 5 mg by mouth at bedtime.     No current facility-administered medications on file prior to visit.   ALLERGIES REVIEWED IN EPIC -- no change SOCIAL AND FAMILY HISTORY REVIEWED IN EPIC -- she is currently at a skilled nursing facility for rehabilitation.  Wt Readings from Last 3 Encounters:  10/08/14 143 lb 3.2 oz (64.955 kg)  06/26/14 150 lb 9.2 oz (68.3 kg)  04/19/14 142 lb 4.8 oz (64.547 kg)    PHYSICAL  EXAM BP 122/92 mmHg  Pulse 73  Ht 5\' 4"  (1.626 m)  Wt 143 lb 3.2 oz (64.955 kg)  BMI 24.57 kg/m2 General appearance: alert, cooperative, appears stated age, no acute distress and frail appearing; continues to be a poor historian Neck: no adenopathy, no carotid bruit and no JVD Lungs: clear to auscultation bilaterally, normal percussion bilaterally and non-labored Heart: regular rate and rhythm, S1& S2 normal, no click, rub or gallop, nondisplaced PMI; 2/6C-the heart early peaking SEM at the RUSB--> Carotids Abdomen: soft, non-tender; bowel sounds normal; no masses,  no organomegaly; no HJR Extremities: extremities normal, atraumatic, no cyanosis, and edema Pulses: 2+ and symmetric; Neurologic: Mental status: Alert, oriented, thought content appropriate Cranial nerves: normal (II-XII grossly intact)   Adult ECG Report  Rate: 73 ;  Rhythm: normal sinus rhythm and sinus arrhythmia; moderate LVH, cannot exclude inferior MI, age undetermined  Narrative Interpretation: no significant change  Recent Labs:    Lab Results  Component Value Date   CHOL 132 04/20/2014   HDL 51 04/20/2014   LDLCALC 65 04/20/2014   TRIG 82 04/20/2014   CHOLHDL 2.6 04/20/2014     Chemistry      Component Value Date/Time   NA 142 06/25/2014 1305   K 4.2 06/25/2014 1305   CL 102 06/25/2014 1305   CO2 27 06/25/2014 1305   BUN 24* 06/25/2014 1305   CREATININE 1.13* 06/25/2014 1305      Component Value Date/Time   CALCIUM 9.4 06/25/2014 1305   ALKPHOS 95 06/25/2014 1305   AST 16 06/25/2014 1305   ALT 12 06/25/2014 1305   BILITOT 0.4 06/25/2014 1305      ASSESSMENT / PLAN: PAF (paroxysmal atrial fibrillation) Abdomen she's had any true recurrences, she has had now a possible stroke/TIA, unfortunately she remains a fall risk.  We had a long talkbetween myself, the patient and her daughter about the risks and benefits of anticoagulation. Her daughter agrees that for adequate ablation may be too high  risk. She is on beta blocker for rate control, has not had any heart failure exacerbations related to A. fib  SVT (supraventricular tachycardia) No further episodes since she was in septic shock once before. Is on beta blocker.  Acute CVA (cerebrovascular accident)  Based on neurology recommendations, we'll continue with Plavix. Her cardiovascular risk she remains on statin as well.  AAA (abdominal aortic aneurysm) without rupture Was about 4.5 x 4.2 cm by follow-up Doppler in January, a CT scan of the abdomen which suggested as 4.9 cm.  The plan is follow-up with your annual or biannual Dopplers unless enlarging.    Orders Placed This Encounter  Procedures  . EKG 12-Lead    Followup: 6 months   HARDING,DAVID W, M.D., M.S. Interventional Cardiologist   Pager # [phone]

## 2014-10-14 NOTE — Assessment & Plan Note (Signed)
Abdomen she's had any true recurrences, she has had now a possible stroke/TIA, unfortunately she remains a fall risk.  We had a long talkbetween myself, the patient and her daughter about the risks and benefits of anticoagulation. Her daughter agrees that for adequate ablation may be too high risk. She is on beta blocker for rate control, has not had any heart failure exacerbations related to A. fib

## 2014-10-23 ENCOUNTER — Emergency Department (HOSPITAL_COMMUNITY)
Admission: EM | Admit: 2014-10-23 | Discharge: 2014-10-23 | Disposition: A | Discharge status: Home / Self Care | Payer: Medicare Other | Attending: Emergency Medicine | Admitting: Emergency Medicine

## 2014-10-23 ENCOUNTER — Emergency Department (HOSPITAL_COMMUNITY): Payer: Medicare Other

## 2014-10-23 ENCOUNTER — Encounter (HOSPITAL_COMMUNITY): Payer: Self-pay | Admitting: Emergency Medicine

## 2014-10-23 DIAGNOSIS — S79911A Unspecified injury of right hip, initial encounter: Secondary | ICD-10-CM | POA: Diagnosis not present

## 2014-10-23 DIAGNOSIS — Y9389 Activity, other specified: Secondary | ICD-10-CM | POA: Insufficient documentation

## 2014-10-23 DIAGNOSIS — M1990 Unspecified osteoarthritis, unspecified site: Secondary | ICD-10-CM | POA: Diagnosis not present

## 2014-10-23 DIAGNOSIS — R531 Weakness: Secondary | ICD-10-CM | POA: Insufficient documentation

## 2014-10-23 DIAGNOSIS — Z8701 Personal history of pneumonia (recurrent): Secondary | ICD-10-CM | POA: Diagnosis not present

## 2014-10-23 DIAGNOSIS — W1812XA Fall from or off toilet with subsequent striking against object, initial encounter: Secondary | ICD-10-CM | POA: Diagnosis not present

## 2014-10-23 DIAGNOSIS — Y998 Other external cause status: Secondary | ICD-10-CM | POA: Diagnosis not present

## 2014-10-23 DIAGNOSIS — M25551 Pain in right hip: Secondary | ICD-10-CM

## 2014-10-23 DIAGNOSIS — G8929 Other chronic pain: Secondary | ICD-10-CM | POA: Diagnosis not present

## 2014-10-23 DIAGNOSIS — Z8719 Personal history of other diseases of the digestive system: Secondary | ICD-10-CM | POA: Diagnosis not present

## 2014-10-23 DIAGNOSIS — G629 Polyneuropathy, unspecified: Secondary | ICD-10-CM | POA: Insufficient documentation

## 2014-10-23 DIAGNOSIS — F329 Major depressive disorder, single episode, unspecified: Secondary | ICD-10-CM | POA: Insufficient documentation

## 2014-10-23 DIAGNOSIS — Z87891 Personal history of nicotine dependence: Secondary | ICD-10-CM | POA: Insufficient documentation

## 2014-10-23 DIAGNOSIS — Z8673 Personal history of transient ischemic attack (TIA), and cerebral infarction without residual deficits: Secondary | ICD-10-CM | POA: Insufficient documentation

## 2014-10-23 DIAGNOSIS — I480 Paroxysmal atrial fibrillation: Secondary | ICD-10-CM | POA: Diagnosis not present

## 2014-10-23 DIAGNOSIS — I10 Essential (primary) hypertension: Secondary | ICD-10-CM | POA: Insufficient documentation

## 2014-10-23 DIAGNOSIS — Z880 Allergy status to penicillin: Secondary | ICD-10-CM | POA: Diagnosis not present

## 2014-10-23 DIAGNOSIS — R350 Frequency of micturition: Secondary | ICD-10-CM | POA: Diagnosis not present

## 2014-10-23 DIAGNOSIS — Z7951 Long term (current) use of inhaled steroids: Secondary | ICD-10-CM | POA: Diagnosis not present

## 2014-10-23 DIAGNOSIS — Z792 Long term (current) use of antibiotics: Secondary | ICD-10-CM | POA: Diagnosis not present

## 2014-10-23 DIAGNOSIS — Z7902 Long term (current) use of antithrombotics/antiplatelets: Secondary | ICD-10-CM | POA: Diagnosis not present

## 2014-10-23 DIAGNOSIS — F419 Anxiety disorder, unspecified: Secondary | ICD-10-CM | POA: Insufficient documentation

## 2014-10-23 DIAGNOSIS — M169 Osteoarthritis of hip, unspecified: Secondary | ICD-10-CM | POA: Diagnosis not present

## 2014-10-23 DIAGNOSIS — Z79899 Other long term (current) drug therapy: Secondary | ICD-10-CM | POA: Diagnosis not present

## 2014-10-23 DIAGNOSIS — S3992XA Unspecified injury of lower back, initial encounter: Secondary | ICD-10-CM | POA: Insufficient documentation

## 2014-10-23 DIAGNOSIS — W19XXXA Unspecified fall, initial encounter: Secondary | ICD-10-CM

## 2014-10-23 DIAGNOSIS — E039 Hypothyroidism, unspecified: Secondary | ICD-10-CM | POA: Diagnosis not present

## 2014-10-23 DIAGNOSIS — F0390 Unspecified dementia without behavioral disturbance: Secondary | ICD-10-CM | POA: Diagnosis not present

## 2014-10-23 DIAGNOSIS — Z9181 History of falling: Secondary | ICD-10-CM | POA: Insufficient documentation

## 2014-10-23 DIAGNOSIS — Y92002 Bathroom of unspecified non-institutional (private) residence single-family (private) house as the place of occurrence of the external cause: Secondary | ICD-10-CM | POA: Insufficient documentation

## 2014-10-23 DIAGNOSIS — M549 Dorsalgia, unspecified: Secondary | ICD-10-CM

## 2014-10-23 DIAGNOSIS — M1611 Unilateral primary osteoarthritis, right hip: Secondary | ICD-10-CM

## 2014-10-23 LAB — CBC WITH DIFFERENTIAL/PLATELET
BASOS PCT: 0 % (ref 0–1)
Basophils Absolute: 0 10*3/uL (ref 0.0–0.1)
EOS ABS: 0.2 10*3/uL (ref 0.0–0.7)
Eosinophils Relative: 3 % (ref 0–5)
HCT: 38.6 % (ref 36.0–46.0)
Hemoglobin: 12.9 g/dL (ref 12.0–15.0)
LYMPHS ABS: 1.6 10*3/uL (ref 0.7–4.0)
Lymphocytes Relative: 21 % (ref 12–46)
MCH: 30.1 pg (ref 26.0–34.0)
MCHC: 33.4 g/dL (ref 30.0–36.0)
MCV: 90 fL (ref 78.0–100.0)
Monocytes Absolute: 0.7 10*3/uL (ref 0.1–1.0)
Monocytes Relative: 9 % (ref 3–12)
NEUTROS PCT: 67 % (ref 43–77)
Neutro Abs: 4.9 10*3/uL (ref 1.7–7.7)
PLATELETS: 235 10*3/uL (ref 150–400)
RBC: 4.29 MIL/uL (ref 3.87–5.11)
RDW: 14.1 % (ref 11.5–15.5)
WBC: 7.4 10*3/uL (ref 4.0–10.5)

## 2014-10-23 LAB — COMPREHENSIVE METABOLIC PANEL
ALK PHOS: 97 U/L (ref 39–117)
ALT: 12 U/L (ref 0–35)
AST: 15 U/L (ref 0–37)
Albumin: 3.1 g/dL — ABNORMAL LOW (ref 3.5–5.2)
Anion gap: 11 (ref 5–15)
BILIRUBIN TOTAL: 0.2 mg/dL — AB (ref 0.3–1.2)
BUN: 27 mg/dL — ABNORMAL HIGH (ref 6–23)
CO2: 28 mEq/L (ref 19–32)
CREATININE: 1.14 mg/dL — AB (ref 0.50–1.10)
Calcium: 9.2 mg/dL (ref 8.4–10.5)
Chloride: 100 mEq/L (ref 96–112)
GFR calc non Af Amer: 42 mL/min — ABNORMAL LOW (ref >90)
GFR, EST AFRICAN AMERICAN: 49 mL/min — AB (ref >90)
GLUCOSE: 130 mg/dL — AB (ref 70–99)
POTASSIUM: 3.6 meq/L — AB (ref 3.7–5.3)
Sodium: 139 mEq/L (ref 137–147)
Total Protein: 6.7 g/dL (ref 6.0–8.3)

## 2014-10-23 LAB — URINALYSIS, ROUTINE W REFLEX MICROSCOPIC
Bilirubin Urine: NEGATIVE
Glucose, UA: NEGATIVE mg/dL
Hgb urine dipstick: NEGATIVE
KETONES UR: NEGATIVE mg/dL
Leukocytes, UA: NEGATIVE
Nitrite: NEGATIVE
Protein, ur: NEGATIVE mg/dL
Specific Gravity, Urine: 1.011 (ref 1.005–1.030)
UROBILINOGEN UA: 0.2 mg/dL (ref 0.0–1.0)
pH: 6.5 (ref 5.0–8.0)

## 2014-10-23 LAB — PROTIME-INR
INR: 0.99 (ref 0.00–1.49)
PROTHROMBIN TIME: 13.2 s (ref 11.6–15.2)

## 2014-10-23 LAB — LIPASE, BLOOD: LIPASE: 18 U/L (ref 11–59)

## 2014-10-23 LAB — LACTIC ACID, PLASMA: Lactic Acid, Venous: 1.1 mmol/L (ref 0.5–2.2)

## 2014-10-23 MED ORDER — OXYCODONE HCL 5 MG PO TABS
10.0000 mg | ORAL_TABLET | Freq: Once | ORAL | Status: AC
  Administered 2014-10-23: 10 mg via ORAL
  Filled 2014-10-23: qty 2

## 2014-10-23 MED ORDER — ACETAMINOPHEN 325 MG PO TABS
650.0000 mg | ORAL_TABLET | Freq: Once | ORAL | Status: AC
  Administered 2014-10-23: 650 mg via ORAL
  Filled 2014-10-23: qty 2

## 2014-10-23 NOTE — ED Provider Notes (Signed)
CSN: [id number]     Arrival date & time 10/23/14  1517 History   First MD Initiated Contact with Patient 10/23/14 1602     Chief Complaint  Patient presents with  . Fall     (Consider location/radiation/quality/duration/timing/severity/associated sxs/prior Treatment) HPI  Chelsea Lynch is a 80 y.o. female with PMH of CVA with left sided residual weakness, dementia, degenerative disc disease, arthritis, chronic lower back pain, AAA, frequent falls presenting after a fall 3 days ago from the toilet. Patient fell on her right hip with complaint of right-sided pain, hip pain. Pain worse with ambulation. No head injury, LOC, new headache, visual changes, slurred speech, nausea, vomiting. Patient normally ambulates with a walker. Patient is here with her daughter at bed side. Patient has 24-hour care. Per family patient notes increased difficulty with ambulation due to pain during walking not weakness, urinary frequency and foul odor to urine. Pt with history of chronic UTIs. Family notes intermittent confusion that is not sudden or progressive. Daughter states she is mentating at her baseline today. No fevers, chills, nausea, vomiting, diarrhea. Last stool yesterday and nonbloody. Surgical history includes appendectomy and cholecystectomy. Pt on plavix but no other anticoagulants. Pt closely followed for AAA with last appointment one month ago and per patient AAA is stable.   Past Medical History  Diagnosis Date  . GERD (gastroesophageal reflux disease)   . Depression   . Neuropathy   . Constipation, chronic   . Insomnia   . Chronic pain   . Hypothyroid   . Anxiety   . Frequent falls     "not much in the last few months" (11/08/2013)  . PAF (paroxysmal atrial fibrillation)     not on anticoagulation due to a fall risk, wore an Event monitor 02//23/11-03/03/11  . PAT (paroxysmal atrial tachycardia)     Supraventricular tachycardia; most recent episode associated with sepsis  . Hypertension    . AAA (abdominal aortic aneurysm) without rupture     Followed by routine Dopplers; ~4.9 cm diameter by CT abdomen February 2015  . Spinal stenosis   . Complication of anesthesia     "it's hard for me to wakeup" (11/08/2013)  . Pneumonia     "several years ago" (11/08/2013)  . Chronic bronchitis     "hasn't had it in quite a few years" (11/08/2013)  . Shortness of breath     "can come about at anytime" (11/08/2013)  . Sleep apnea     "doesn't wear a mask; RX'd but wouldn't get one" (11/08/2013)  . Headache(784.0)     "at least weekly; sometimes daily" (11/08/2013)  . Stroke ?1980's    denies residual on 11/08/2013  . DDD (degenerative disc disease)     "all of her spine" (11/08/2013)  . Arthritis     "joints; back; hands are getting pretty bad" (11/08/2013)  . Chronic lower back pain   . Chronic leg pain     "nerve pain" (11/08/2013)  . Dementia     "confusion comes and goes; mostly situational; does not have good short term memory but won't admit it" (11/08/2013)   Past Surgical History  Procedure Laterality Date  . Nm myocar perf wall motion  June 2014    No ischemia or infarction, mild apical breast attenuation, EF roughly 70%  . Doppler echocardiography  03/2013; 04/2014    Normal EF 60-65%. Mild/moderate aortic regurgitation, mildly dilated RV with moderate pulmonary hypertension; b. EF 60-65%, Gr 1 DD, Mod Ao Sclerosis - no  stenosis, Mild AI, normal PAP  . Abdomnal aorta  02/25/2012    mid abd. aorta  mid segment diltaton 3.93 x 4.55 cm greatest diameter.mild amt atherosclerosis without evidence of sigificant diamter reduction  . Lower arterial extremities doppler  02/19/2011    right abi 0.98,left 0.97  . Renal doppler  02/12/2011    abd aorta prox 2.6 x 3.1 cm,mid 4.1 x 3.6 cm, distal 2.3 x 3.4 cm, right renal artery 1-59% left renal artery norm. ,both renal size normal  . Renal doppler  02/11/2010    infrarenal AAA mearsuring 3.6 x 3.7 cm  . Appendectomy    . Cholecystectomy     . Eye surgery    . Lumbar disc surgery  X 3  . Tonsillectomy    . Knee arthroscopy Right ?1980's  . Vaginal hysterectomy    . Dilation and curettage of uterus      "a few" (11/08/2013)  . Cataract extraction w/ intraocular lens  implant, bilateral Bilateral 2000's  . Anterior cervical decomp/discectomy fusion     Family History  Problem Relation Age of Onset  . Stroke Mother   . Heart attack Father   . Lung cancer Sister   . Bone cancer Sister   . High blood pressure    . Heart disease     History  Substance Use Topics  . Smoking status: Former Smoker -- 0.50 packs/day for 10 years    Types: Cigarettes  . Smokeless tobacco: Never Used  . Alcohol Use: Yes     Comment: 11/08/2013 "used to drink some; never had problem w/it; haven't had a drink in years"   OB History    No data available     Review of Systems  Constitutional: Negative for fever and chills.  HENT: Negative for congestion and rhinorrhea.   Eyes: Negative for visual disturbance.  Respiratory: Negative for cough.   Cardiovascular: Negative for chest pain and palpitations.  Gastrointestinal: Negative for nausea, vomiting, abdominal pain and diarrhea.  Genitourinary: Positive for frequency. Negative for hematuria.  Musculoskeletal: Negative for back pain and gait problem.  Skin: Negative for wound.  Neurological: Positive for weakness. Negative for headaches.      Allergies  Clarithromycin; Codeine; Tequin; and Penicillins  Home Medications   Prior to Admission medications   Medication Sig Start Date End Date Taking? Authorizing Provider  atorvastatin (LIPITOR) 10 MG tablet Take 1 tablet (10 mg total) by mouth daily at 6 PM. 11/10/13  Yes Esperanza SheetsUlugbek N Buriev, MD  Biotin 5000 MCG CAPS Take 5,000 mcg by mouth daily.   Yes Historical Provider, MD  clopidogrel (PLAVIX) 75 MG tablet Take 1 tablet (75 mg total) by mouth daily. 06/28/14  Yes Adeline Joselyn Glassman Viyuoh, MD  docusate sodium (COLACE) 50 MG capsule Take 50 mg by  mouth every evening.   Yes Historical Provider, MD  donepezil (ARICEPT) 5 MG tablet Take 5 mg by mouth at bedtime.   Yes Historical Provider, MD  doxycycline (VIBRAMYCIN) 100 MG capsule Take 100 mg by mouth every evening.  09/04/14  Yes Historical Provider, MD  fish oil-omega-3 fatty acids 1000 MG capsule Take 2 g by mouth every morning.    Yes Historical Provider, MD  fluticasone (FLONASE) 50 MCG/ACT nasal spray Place 1 spray into both nostrils daily.   Yes Historical Provider, MD  furosemide (LASIX) 20 MG tablet Take 20 mg by mouth daily.   Yes Historical Provider, MD  gabapentin (NEURONTIN) 400 MG capsule Take 1 capsule (400 mg  total) by mouth at bedtime. 06/28/14  Yes Adeline Joselyn Glassman, MD  levothyroxine (SYNTHROID, LEVOTHROID) 75 MCG tablet Take 1 tablet (75 mcg total) by mouth daily before breakfast. 04/06/13  Yes Joseph Art, DO  loratadine (CLARITIN) 10 MG tablet Take 10 mg by mouth every evening.   Yes Historical Provider, MD  Melatonin 5 MG TABS Take 10 mg by mouth at bedtime.   Yes Historical Provider, MD  metoprolol succinate (TOPROL-XL) 50 MG 24 hr tablet Take 25 mg by mouth 2 (two) times daily. Take with or immediately following a meal. 04/06/13  Yes Joseph Art, DO  mirabegron ER (MYRBETRIQ) 50 MG TB24 Take 50 mg by mouth every evening.    Yes Historical Provider, MD  mirtazapine (REMERON) 15 MG tablet Take 15 mg by mouth every evening.  10/03/14  Yes Historical Provider, MD  nitrofurantoin (MACRODANTIN) 100 MG capsule Take 100 mg by mouth every evening.  10/03/14  Yes Historical Provider, MD  oxyCODONE (OXYCONTIN) 10 MG 12 hr tablet Take 10 mg by mouth 2 (two) times daily.    Yes Historical Provider, MD  potassium chloride (K-DUR) 10 MEQ tablet Take 10 mEq by mouth every other day.  09/18/14  Yes Historical Provider, MD  venlafaxine XR (EFFEXOR-XR) 75 MG 24 hr capsule Take 75 mg by mouth 2 (two) times daily.   Yes Historical Provider, MD  Vitamin D, Ergocalciferol, (DRISDOL) 50000  UNITS CAPS Take 50,000 Units by mouth every 7 (seven) days. wednesdays   Yes Historical Provider, MD  zaleplon (SONATA) 5 MG capsule Take 5 mg by mouth at bedtime.   Yes Historical Provider, MD  acetaminophen (TYLENOL) 500 MG tablet Take 1,000 mg by mouth every 6 (six) hours as needed for mild pain.    Historical Provider, MD  bisacodyl (DULCOLAX) 10 MG suppository Place 10 mg rectally daily as needed for mild constipation or moderate constipation.    Historical Provider, MD  docusate sodium (COLACE) 100 MG capsule Take 200 mg by mouth every evening.     Historical Provider, MD  furosemide (LASIX) 20 MG tablet TAKE 1-2 TABLETS DAILY AS DIRECTED. 09/19/14   Marykay Lex, MD  nitrofurantoin, macrocrystal-monohydrate, (MACROBID) 100 MG capsule Take 100 mg by mouth every evening.    Historical Provider, MD  OXYCONTIN 10 MG T12A 12 hr tablet  09/04/14   Historical Provider, MD   BP 145/78 mmHg  Pulse 87  Temp(Src) 98.6 F (37 C) (Oral)  Resp 18  SpO2 97% Physical Exam  Constitutional: She appears well-developed and well-nourished. No distress.  HENT:  Head: Normocephalic and atraumatic.  Eyes: Conjunctivae and EOM are normal. Right eye exhibits no discharge. Left eye exhibits no discharge.  Cardiovascular: Normal rate and regular rhythm.   Pulmonary/Chest: Effort normal and breath sounds normal. No respiratory distress. She has no wheezes.  Abdominal: Soft. Bowel sounds are normal. She exhibits no distension. There is no tenderness.  Musculoskeletal:  No midline back tenderness, step off or crepitus. Right sided lower back and right side tenderness. No ecchymoses, erythema or abrasions. Electrical stimulation device in back without overlying skin changes, evidence of infection or tenderness. No CVA tenderness.  Neurological: She is alert. She exhibits normal muscle tone. Coordination normal.  Equal muscle tone. 5/5 strength in right lower and upper extremities. 4/5 strength in left upper and  lower extremities. No pronator drift.  Antalgic, steady gait with assistance. At baseline per family.  Skin: Skin is warm and dry. She is not diaphoretic.  Nursing note and vitals reviewed.   ED Course  Procedures (including critical care time) Labs Review Labs Reviewed  COMPREHENSIVE METABOLIC PANEL - Abnormal; Notable for the following:    Potassium 3.6 (*)    Glucose, Bld 130 (*)    BUN 27 (*)    Creatinine, Ser 1.14 (*)    Albumin 3.1 (*)    Total Bilirubin 0.2 (*)    GFR calc non Af Amer 42 (*)    GFR calc Af Amer 49 (*)    All other components within normal limits  CBC WITH DIFFERENTIAL  PROTIME-INR  URINALYSIS, ROUTINE W REFLEX MICROSCOPIC  LIPASE, BLOOD  LACTIC ACID, PLASMA    Imaging Review Dg Ribs Unilateral W/chest Right  10/23/2014   CLINICAL DATA:  Larey SeatFell 3 days ago. Worsening pain in the back, axillary regions and lower ribs.  EXAM: RIGHT RIBS AND CHEST - 3+ VIEW  COMPARISON:  06/25/2014  FINDINGS: Heart size is normal. There is atherosclerosis of the aorta with aortic ectasia. Lungs show mild chronic scarring at the bases. No evidence of heart failure or effusion. Thoracic neurostimulator leads in place, unchanged since the previous study with 1 lead at the T3 level and the other lead at the T7-8 level.  Right rib films do not show any fracture or focal lesion.  IMPRESSION: No sign of rib fracture or focal lesion. No active cardiopulmonary disease. Chronic aortic atherosclerosis and ectasia. Thoracic neuro stimulators in place as above.   Electronically Signed   By: Paulina FusiMark  Shogry M.D.   On: 10/23/2014 17:14   Dg Hip Complete Right  10/23/2014   CLINICAL DATA:  80 year old female with progressive right hip/groin pain after falling 3 days ago.  EXAM: RIGHT HIP - COMPLETE 2+ VIEW  COMPARISON:  Prior radiographs of the pelvis and right hip 04/20/2014  FINDINGS: Epidural spinal stimulator generator projects over the left pelvis. The bones are osteopenic. There is no evidence  of acute fracture or malalignment. Moderate degenerative osteoarthritis of the right hip joint with narrowing of the joint space, subchondral sclerosis and cyst formation. Unremarkable visualized bowel gas pattern. Scattered atherosclerotic vascular calcifications.  IMPRESSION: 1. No evidence of acute fracture or malalignment. 2. Moderate right hip joint osteoarthritis. 3. Atherosclerotic vascular calcifications. 4. Osteopenia.   Electronically Signed   By: Malachy MoanHeath  McCullough M.D.   On: 10/23/2014 17:09     EKG Interpretation None      MDM   Final diagnoses:  Tenderness of hip, right  Fall  Chronic back pain   Pt presents after fall from toliet to floor landing on right hip. Pt also with chronic back pain. Xrays without acute fractures or malalignment of hip and right ribs and chest. Neurostimulator in place and unchanged. Pt with moderate hip OA, likely contributing. VSS. Benign abdomen. TN to right hip and right side/back. Patient ambulated without difficulty with assistance and at baseline per family after pain medication. I suspect this is acute worsening of chronic back pain due to fall. tx with double one dose of pain medication per day for next 2-3 days. Follow up with PCP.   Discussed return precautions with patient. Discussed all results and patient verbalizes understanding and agrees with plan.  This is a shared patient. This patient was discussed with the physician, Dr. Micheline Mazeocherty who saw and evaluated the patient and agrees with the plan.     Louann SjogrenVictoria L Aaronjames Kelsay, PA-C 10/24/14 1424  Toy CookeyMegan Docherty, MD 10/25/14 1200

## 2014-10-23 NOTE — ED Notes (Addendum)
Per ems pt from home. Pt receives 24h care. Pt fell three days ago from sitting position, hit right hip/back. Pt on Plavix. No obvious bruising or deformities. Pt has chronic pain for which pt receive back  Electrical stimulation. Per family pt co weakness and urinary frequency,  they suspect possible uti. Pt is alert, hx of dementia. Pt is normally ambulatory with significant assistance

## 2014-10-23 NOTE — ED Notes (Signed)
Bed: WA09 Expected date:  Expected time:  Means of arrival: Ambulance Comments: Weakness

## 2014-10-23 NOTE — ED Notes (Signed)
Pt unable to void at this time. 

## 2014-10-23 NOTE — Discharge Instructions (Signed)
Return to the emergency room with worsening of symptoms, new symptoms or with symptoms that are concerning, especially worsening severe pain, nausea, vomiting, abdominal pain, unable to tolerate fluids.  Follow up with your PCP as soon as possible to discuss possible physical therapy.  Treat pain with typical pain regimen with addition of Tylenol or double dose of oral pain medication for one dose daily. Encourage frequent ambulation do not lay in bed all day.

## 2014-10-23 NOTE — ED Notes (Signed)
Pt in x-ray, unable to obtain blood at this time.

## 2014-10-30 ENCOUNTER — Other Ambulatory Visit: Payer: Self-pay | Admitting: Cardiology

## 2014-10-30 NOTE — Telephone Encounter (Signed)
Rx refill sent to patient pharmacy   

## 2014-11-08 ENCOUNTER — Emergency Department (HOSPITAL_COMMUNITY): Payer: Medicare Other

## 2014-11-08 ENCOUNTER — Other Ambulatory Visit: Payer: Self-pay

## 2014-11-08 ENCOUNTER — Encounter (HOSPITAL_COMMUNITY): Payer: Self-pay | Admitting: *Deleted

## 2014-11-08 ENCOUNTER — Emergency Department (HOSPITAL_COMMUNITY)
Admission: EM | Admit: 2014-11-08 | Discharge: 2014-11-09 | Disposition: A | Discharge status: Home / Self Care | Payer: Medicare Other | Attending: Emergency Medicine | Admitting: Emergency Medicine

## 2014-11-08 DIAGNOSIS — Z8739 Personal history of other diseases of the musculoskeletal system and connective tissue: Secondary | ICD-10-CM | POA: Insufficient documentation

## 2014-11-08 DIAGNOSIS — Z792 Long term (current) use of antibiotics: Secondary | ICD-10-CM | POA: Insufficient documentation

## 2014-11-08 DIAGNOSIS — R0602 Shortness of breath: Secondary | ICD-10-CM | POA: Insufficient documentation

## 2014-11-08 DIAGNOSIS — Z9181 History of falling: Secondary | ICD-10-CM | POA: Diagnosis not present

## 2014-11-08 DIAGNOSIS — Z8709 Personal history of other diseases of the respiratory system: Secondary | ICD-10-CM | POA: Insufficient documentation

## 2014-11-08 DIAGNOSIS — Z7902 Long term (current) use of antithrombotics/antiplatelets: Secondary | ICD-10-CM | POA: Diagnosis not present

## 2014-11-08 DIAGNOSIS — Z880 Allergy status to penicillin: Secondary | ICD-10-CM | POA: Diagnosis not present

## 2014-11-08 DIAGNOSIS — G629 Polyneuropathy, unspecified: Secondary | ICD-10-CM | POA: Insufficient documentation

## 2014-11-08 DIAGNOSIS — Z8701 Personal history of pneumonia (recurrent): Secondary | ICD-10-CM | POA: Insufficient documentation

## 2014-11-08 DIAGNOSIS — G8929 Other chronic pain: Secondary | ICD-10-CM | POA: Diagnosis not present

## 2014-11-08 DIAGNOSIS — F0390 Unspecified dementia without behavioral disturbance: Secondary | ICD-10-CM | POA: Insufficient documentation

## 2014-11-08 DIAGNOSIS — E039 Hypothyroidism, unspecified: Secondary | ICD-10-CM | POA: Insufficient documentation

## 2014-11-08 DIAGNOSIS — R0789 Other chest pain: Secondary | ICD-10-CM | POA: Insufficient documentation

## 2014-11-08 DIAGNOSIS — Z7951 Long term (current) use of inhaled steroids: Secondary | ICD-10-CM | POA: Insufficient documentation

## 2014-11-08 DIAGNOSIS — Z8673 Personal history of transient ischemic attack (TIA), and cerebral infarction without residual deficits: Secondary | ICD-10-CM | POA: Insufficient documentation

## 2014-11-08 DIAGNOSIS — I10 Essential (primary) hypertension: Secondary | ICD-10-CM | POA: Diagnosis not present

## 2014-11-08 DIAGNOSIS — Z79899 Other long term (current) drug therapy: Secondary | ICD-10-CM | POA: Insufficient documentation

## 2014-11-08 DIAGNOSIS — Z87891 Personal history of nicotine dependence: Secondary | ICD-10-CM | POA: Insufficient documentation

## 2014-11-08 DIAGNOSIS — R410 Disorientation, unspecified: Secondary | ICD-10-CM

## 2014-11-08 DIAGNOSIS — Z8719 Personal history of other diseases of the digestive system: Secondary | ICD-10-CM | POA: Insufficient documentation

## 2014-11-08 DIAGNOSIS — R079 Chest pain, unspecified: Secondary | ICD-10-CM | POA: Diagnosis present

## 2014-11-08 LAB — COMPREHENSIVE METABOLIC PANEL
ALBUMIN: 3 g/dL — AB (ref 3.5–5.2)
ALT: 11 U/L (ref 0–35)
AST: 16 U/L (ref 0–37)
Alkaline Phosphatase: 113 U/L (ref 39–117)
Anion gap: 14 (ref 5–15)
BUN: 31 mg/dL — ABNORMAL HIGH (ref 6–23)
CALCIUM: 9.5 mg/dL (ref 8.4–10.5)
CO2: 25 mEq/L (ref 19–32)
Chloride: 101 mEq/L (ref 96–112)
Creatinine, Ser: 1.45 mg/dL — ABNORMAL HIGH (ref 0.50–1.10)
GFR calc Af Amer: 36 mL/min — ABNORMAL LOW (ref >90)
GFR calc non Af Amer: 31 mL/min — ABNORMAL LOW (ref >90)
Glucose, Bld: 117 mg/dL — ABNORMAL HIGH (ref 70–99)
Potassium: 4.1 mEq/L (ref 3.7–5.3)
SODIUM: 140 meq/L (ref 137–147)
TOTAL PROTEIN: 7 g/dL (ref 6.0–8.3)
Total Bilirubin: <0.2 mg/dL — ABNORMAL LOW (ref 0.3–1.2)

## 2014-11-08 LAB — CBC WITH DIFFERENTIAL/PLATELET
BASOS ABS: 0 10*3/uL (ref 0.0–0.1)
Basophils Relative: 0 % (ref 0–1)
EOS ABS: 0.1 10*3/uL (ref 0.0–0.7)
Eosinophils Relative: 1 % (ref 0–5)
HCT: 35.9 % — ABNORMAL LOW (ref 36.0–46.0)
Hemoglobin: 11.8 g/dL — ABNORMAL LOW (ref 12.0–15.0)
Lymphocytes Relative: 14 % (ref 12–46)
Lymphs Abs: 1.3 10*3/uL (ref 0.7–4.0)
MCH: 29 pg (ref 26.0–34.0)
MCHC: 32.9 g/dL (ref 30.0–36.0)
MCV: 88.2 fL (ref 78.0–100.0)
Monocytes Absolute: 0.7 10*3/uL (ref 0.1–1.0)
Monocytes Relative: 7 % (ref 3–12)
NEUTROS PCT: 78 % — AB (ref 43–77)
Neutro Abs: 7.6 10*3/uL (ref 1.7–7.7)
PLATELETS: 424 10*3/uL — AB (ref 150–400)
RBC: 4.07 MIL/uL (ref 3.87–5.11)
RDW: 13.6 % (ref 11.5–15.5)
WBC: 9.8 10*3/uL (ref 4.0–10.5)

## 2014-11-08 LAB — PRO B NATRIURETIC PEPTIDE: Pro B Natriuretic peptide (BNP): 1514 pg/mL — ABNORMAL HIGH (ref 0–450)

## 2014-11-08 LAB — URINALYSIS, ROUTINE W REFLEX MICROSCOPIC
Bilirubin Urine: NEGATIVE
Glucose, UA: NEGATIVE mg/dL
Hgb urine dipstick: NEGATIVE
Ketones, ur: NEGATIVE mg/dL
Nitrite: NEGATIVE
PH: 7 (ref 5.0–8.0)
PROTEIN: NEGATIVE mg/dL
SPECIFIC GRAVITY, URINE: 1.012 (ref 1.005–1.030)
Urobilinogen, UA: 0.2 mg/dL (ref 0.0–1.0)

## 2014-11-08 LAB — PROTIME-INR
INR: 1.06 (ref 0.00–1.49)
PROTHROMBIN TIME: 13.9 s (ref 11.6–15.2)

## 2014-11-08 LAB — TROPONIN I: Troponin I: <0.3 ng/mL (ref <0.30)

## 2014-11-08 LAB — URINE MICROSCOPIC-ADD ON

## 2014-11-08 MED ORDER — SODIUM CHLORIDE 0.9 % IV BOLUS (SEPSIS): 500.0000 mL | Freq: Once | INTRAVENOUS | Status: DC

## 2014-11-08 NOTE — ED Notes (Signed)
Patient transported to CT 

## 2014-11-08 NOTE — ED Provider Notes (Signed)
CSN: [id number]     Arrival date & time 11/08/14  1809 History   First MD Initiated Contact with Patient 11/08/14 1814     Chief Complaint  Patient presents with  . Dementia     (Consider location/radiation/quality/duration/timing/severity/associated sxs/prior Treatment) Patient is a 80 y.o. female presenting with chest pain. The history is provided by the patient.  Chest Pain Pain location:  L chest Pain quality comment:  Squeezing Pain radiates to:  Does not radiate Pain radiates to the back: no   Pain severity:  Moderate Onset quality:  Sudden Timing: once. Progression:  Resolved Chronicity:  New Context: at rest   Relieved by: having a bm. Worsened by:  Nothing tried Ineffective treatments:  None tried Associated symptoms: no abdominal pain, no back pain, no cough, no dizziness, no fatigue, no fever, no headache, no nausea, no shortness of breath and not vomiting     Past Medical History  Diagnosis Date  . GERD (gastroesophageal reflux disease)   . Depression   . Neuropathy   . Constipation, chronic   . Insomnia   . Chronic pain   . Hypothyroid   . Anxiety   . Frequent falls     "not much in the last few months" (11/08/2013)  . PAF (paroxysmal atrial fibrillation)     not on anticoagulation due to a fall risk, wore an Event monitor 02//23/11-03/03/11  . PAT (paroxysmal atrial tachycardia)     Supraventricular tachycardia; most recent episode associated with sepsis  . Hypertension   . AAA (abdominal aortic aneurysm) without rupture     Followed by routine Dopplers; ~4.9 cm diameter by CT abdomen February 2015  . Spinal stenosis   . Complication of anesthesia     "it's hard for me to wakeup" (11/08/2013)  . Pneumonia     "several years ago" (11/08/2013)  . Chronic bronchitis     "hasn't had it in quite a few years" (11/08/2013)  . Shortness of breath     "can come about at anytime" (11/08/2013)  . Sleep apnea     "doesn't wear a mask; RX'd but wouldn't get one"  (11/08/2013)  . Headache(784.0)     "at least weekly; sometimes daily" (11/08/2013)  . Stroke ?1980's    denies residual on 11/08/2013  . DDD (degenerative disc disease)     "all of her spine" (11/08/2013)  . Arthritis     "joints; back; hands are getting pretty bad" (11/08/2013)  . Chronic lower back pain   . Chronic leg pain     "nerve pain" (11/08/2013)  . Dementia     "confusion comes and goes; mostly situational; does not have good short term memory but won't admit it" (11/08/2013)   Past Surgical History  Procedure Laterality Date  . Nm myocar perf wall motion  June 2014    No ischemia or infarction, mild apical breast attenuation, EF roughly 70%  . Doppler echocardiography  03/2013; 04/2014    Normal EF 60-65%. Mild/moderate aortic regurgitation, mildly dilated RV with moderate pulmonary hypertension; b. EF 60-65%, Gr 1 DD, Mod Ao Sclerosis - no stenosis, Mild AI, normal PAP  . Abdomnal aorta  02/25/2012    mid abd. aorta  mid segment diltaton 3.93 x 4.55 cm greatest diameter.mild amt atherosclerosis without evidence of sigificant diamter reduction  . Lower arterial extremities doppler  02/19/2011    right abi 0.98,left 0.97  . Renal doppler  02/12/2011    abd aorta prox 2.6 x 3.1 cm,mid 4.1  x 3.6 cm, distal 2.3 x 3.4 cm, right renal artery 1-59% left renal artery norm. ,both renal size normal  . Renal doppler  02/11/2010    infrarenal AAA mearsuring 3.6 x 3.7 cm  . Appendectomy    . Cholecystectomy    . Eye surgery    . Lumbar disc surgery  X 3  . Tonsillectomy    . Knee arthroscopy Right ?1980's  . Vaginal hysterectomy    . Dilation and curettage of uterus      "a few" (11/08/2013)  . Cataract extraction w/ intraocular lens  implant, bilateral Bilateral 2000's  . Anterior cervical decomp/discectomy fusion     Family History  Problem Relation Age of Onset  . Stroke Mother   . Heart attack Father   . Lung cancer Sister   . Bone cancer Sister   . High blood pressure    .  Heart disease     History  Substance Use Topics  . Smoking status: Former Smoker -- 0.50 packs/day for 10 years    Types: Cigarettes  . Smokeless tobacco: Never Used  . Alcohol Use: Yes     Comment: 11/08/2013 "used to drink some; never had problem w/it; haven't had a drink in years"   OB History    No data available     Review of Systems  Constitutional: Negative for fever and fatigue.  HENT: Negative for congestion and drooling.   Eyes: Negative for pain.  Respiratory: Negative for cough and shortness of breath.   Cardiovascular: Negative for chest pain.  Gastrointestinal: Negative for nausea, vomiting, abdominal pain and diarrhea.  Genitourinary: Negative for dysuria and hematuria.  Musculoskeletal: Negative for back pain, gait problem and neck pain.  Skin: Negative for color change.  Neurological: Negative for dizziness and headaches.  Hematological: Negative for adenopathy.  Psychiatric/Behavioral: Negative for behavioral problems.  All other systems reviewed and are negative.     Allergies  Clarithromycin; Codeine; Tequin; and Penicillins  Home Medications   Prior to Admission medications   Medication Sig Start Date End Date Taking? Authorizing Provider  acetaminophen (TYLENOL) 500 MG tablet Take 1,000 mg by mouth every 6 (six) hours as needed for mild pain.    Historical Provider, MD  atorvastatin (LIPITOR) 10 MG tablet Take 1 tablet (10 mg total) by mouth daily at 6 PM. 11/10/13   Esperanza Sheets, MD  Biotin 5000 MCG CAPS Take 5,000 mcg by mouth daily.    Historical Provider, MD  bisacodyl (DULCOLAX) 10 MG suppository Place 10 mg rectally daily as needed for mild constipation or moderate constipation.    Historical Provider, MD  clopidogrel (PLAVIX) 75 MG tablet Take 1 tablet (75 mg total) by mouth daily. 06/28/14   Kela Millin, MD  docusate sodium (COLACE) 50 MG capsule Take 50 mg by mouth every evening.    Historical Provider, MD  donepezil (ARICEPT) 5 MG  tablet Take 5 mg by mouth at bedtime.    Historical Provider, MD  doxycycline (VIBRAMYCIN) 100 MG capsule Take 100 mg by mouth every evening.  09/04/14   Historical Provider, MD  fish oil-omega-3 fatty acids 1000 MG capsule Take 2 g by mouth every morning.     Historical Provider, MD  fluticasone (FLONASE) 50 MCG/ACT nasal spray Place 1 spray into both nostrils daily.    Historical Provider, MD  furosemide (LASIX) 20 MG tablet TAKE 1-2 TABLETS DAILY AS DIRECTED. Patient not taking: Reported on 10/25/2014 09/19/14   Marykay Lex, MD  furosemide (LASIX) 20 MG tablet Take 20 mg by mouth daily.    Historical Provider, MD  furosemide (LASIX) 20 MG tablet TAKE 1-2 TABLETS DAILY AS DIRECTED. 10/30/14   Marykay Lex, MD  gabapentin (NEURONTIN) 400 MG capsule Take 1 capsule (400 mg total) by mouth at bedtime. 06/28/14   Kela Millin, MD  levothyroxine (SYNTHROID, LEVOTHROID) 75 MCG tablet Take 1 tablet (75 mcg total) by mouth daily before breakfast. 04/06/13   Joseph Art, DO  loratadine (CLARITIN) 10 MG tablet Take 10 mg by mouth every evening.    Historical Provider, MD  Melatonin 5 MG TABS Take 10 mg by mouth at bedtime.    Historical Provider, MD  metoprolol succinate (TOPROL-XL) 50 MG 24 hr tablet Take 25 mg by mouth 2 (two) times daily. Take with or immediately following a meal. 04/06/13   Joseph Art, DO  mirabegron ER (MYRBETRIQ) 50 MG TB24 Take 50 mg by mouth every evening.     Historical Provider, MD  mirtazapine (REMERON) 15 MG tablet Take 15 mg by mouth every evening.  10/03/14   Historical Provider, MD  nitrofurantoin (MACRODANTIN) 100 MG capsule Take 100 mg by mouth every evening.  10/03/14   Historical Provider, MD  oxyCODONE (OXYCONTIN) 10 MG 12 hr tablet Take 10 mg by mouth 2 (two) times daily.     Historical Provider, MD  potassium chloride (K-DUR) 10 MEQ tablet Take 10 mEq by mouth every other day.  09/18/14   Historical Provider, MD  venlafaxine XR (EFFEXOR-XR) 75 MG 24 hr  capsule Take 75 mg by mouth 2 (two) times daily.    Historical Provider, MD  Vitamin D, Ergocalciferol, (DRISDOL) 50000 UNITS CAPS Take 50,000 Units by mouth every 7 (seven) days. wednesdays    Historical Provider, MD  zaleplon (SONATA) 5 MG capsule Take 5 mg by mouth at bedtime.    Historical Provider, MD   BP 117/81 mmHg  Pulse 86  Temp(Src) 97.4 F (36.3 C) (Oral)  Resp 21  SpO2 100% Physical Exam  Constitutional: She appears well-developed and well-nourished.  HENT:  Head: Normocephalic and atraumatic.  Mouth/Throat: Oropharynx is clear and moist. No oropharyngeal exudate.  Eyes: Conjunctivae and EOM are normal. Pupils are equal, round, and reactive to light.  Neck: Normal range of motion. Neck supple.  Cardiovascular: Normal rate, regular rhythm, normal heart sounds and intact distal pulses.  Exam reveals no gallop and no friction rub.   No murmur heard. Pulmonary/Chest: Effort normal and breath sounds normal. No respiratory distress. She has no wheezes.  Abdominal: Soft. Bowel sounds are normal. There is no tenderness. There is no rebound and no guarding.  Musculoskeletal: Normal range of motion. She exhibits no edema or tenderness.  Neurological: She is alert.  alert, oriented x2, thinks the year is 1965 speech: normal in context and clarity memory: intact grossly cranial nerves II-XII: intact motor strength: full proximally and distally sensation: intact to light touch diffusely  cerebellar: finger-to-nose intact bilaterally gait: deferred, patient does some ambulation with a walker at baseline but minimal recently  Skin: Skin is warm and dry.  Psychiatric: She has a normal mood and affect. Her behavior is normal.  Nursing note and vitals reviewed.   ED Course  Procedures (including critical care time) Labs Review Labs Reviewed  CBC WITH DIFFERENTIAL - Abnormal; Notable for the following:    Hemoglobin 11.8 (*)    HCT 35.9 (*)    Platelets 424 (*)    Neutrophils  Relative % 78 (*)    All other components within normal limits  COMPREHENSIVE METABOLIC PANEL - Abnormal; Notable for the following:    Glucose, Bld 117 (*)    BUN 31 (*)    Creatinine, Ser 1.45 (*)    Albumin 3.0 (*)    Total Bilirubin <0.2 (*)    GFR calc non Af Amer 31 (*)    GFR calc Af Amer 36 (*)    All other components within normal limits  URINALYSIS, ROUTINE W REFLEX MICROSCOPIC - Abnormal; Notable for the following:    Leukocytes, UA SMALL (*)    All other components within normal limits  PRO B NATRIURETIC PEPTIDE - Abnormal; Notable for the following:    Pro B Natriuretic peptide (BNP) 1514.0 (*)    All other components within normal limits  URINE MICROSCOPIC-ADD ON - Abnormal; Notable for the following:    Bacteria, UA FEW (*)    All other components within normal limits  TROPONIN I  PROTIME-INR  I-STAT TROPOININ, ED    Imaging Review Dg Chest 2 View  11/08/2014   CLINICAL DATA:  Acute shortness of breath and chest pain. Initial encounter.  EXAM: CHEST  2 VIEW  COMPARISON:  10/23/2014 and prior radiographs  FINDINGS: The cardiomediastinal silhouette is unchanged with enlarged aortic arch.  There is no evidence of focal airspace disease, pulmonary edema, suspicious pulmonary nodule/mass, pleural effusion, or pneumothorax. No acute bony abnormalities are identified.  A thoracic cord stimulator is again noted.  IMPRESSION: No active cardiopulmonary disease.   Electronically Signed   By: Laveda AbbeJeff  Hu M.D.   On: 11/08/2014 19:47   Ct Head Wo Contrast  11/08/2014   CLINICAL DATA:  Confusion.  EXAM: CT HEAD WITHOUT CONTRAST  TECHNIQUE: Contiguous axial images were obtained from the base of the skull through the vertex without intravenous contrast.  COMPARISON:  June 25, 2014.  FINDINGS: Bony calvarium appears intact. Mild diffuse cortical atrophy is noted. Mild chronic ischemic white matter disease is noted. No mass effect or midline shift is noted. Ventricular size is within normal  limits. There is no evidence of mass lesion, hemorrhage or acute infarction.  IMPRESSION: Mild diffuse cortical atrophy. Mild chronic ischemic white matter disease. No acute intracranial abnormality seen.   Electronically Signed   By: Roque LiasJames  Green M.D.   On: 11/08/2014 21:15     EKG Interpretation None       Date: 11/08/2014  Rate: 78  Rhythm: normal sinus rhythm  QRS Axis: normal  Intervals: PR indet, QTC wnl  ST/T Wave abnormalities: normal  Conduction Disutrbances:none  Narrative Interpretation: No significant obvious change, artifact d/t muscle stimulator limiting interpretation  Old EKG Reviewed: unchanged    MDM   Final diagnoses:  SOB (shortness of breath)  Confusion  Other chest pain    6:37 PM 80 y.o. female w hx of known AAA (4.9 cm), PAF on plavix, dementia who presents with an episode of chest pain which occurred prior to arrival. The patient complained that she felt like she was having a heart attack and was having some squeezing pain in her left upper chest. Her symptoms lasted several minutes and seemed to resolve after having a bowel movement. She currently states that she has some mild shortness of breath but denies any pain. She is alert and oriented 2. Her daughter notes that her blood pressure was low 2 days ago and seemed high today. She is also had some intermittent mild confusion which she has  had previously. She is afebrile and vital signs are unremarkable here. Her abdomen is soft and benign. We'll get screening labs and imaging.  11:45 PM: Istat not crossing over, but 3hr trop on istat is neg. I interpreted/reviewed the labs and/or imaging which were non-contributory.  I discussed the pt's sx w/ her caregiver and family. The pt has known chronic sob which waxes and wanes. It seems to have improved spont on exam and her caregiver notes she appears to be at her baseline. CP atypical in that it was relieved w/ a bm. The pt has been asx throughout her stay. Ecg  non-contrib although interpretation limited by muscle stimulator. She has known waxing/waning confusion at baseline. The family is satisfied w/ her workup and feels comfortable taking her home.  I have discussed the diagnosis/risks/treatment options with the patient and family and believe the pt to be eligible for discharge home to follow-up with her pcp as needed. We also discussed returning to the ED immediately if new or worsening sx occur. We discussed the sx which are most concerning (e.g., worsening sob, fever, return of cp) that necessitate immediate return. Medications administered to the patient during their visit and any new prescriptions provided to the patient are listed below.  Medications given during this visit Medications - No data to display  New Prescriptions   No medications on file     Purvis SheffieldForrest Ariyana Faw, MD 11/09/14 1753

## 2014-11-08 NOTE — ED Notes (Signed)
I-Stat troponin results 0.05. MD made aware.

## 2014-11-08 NOTE — ED Notes (Signed)
Delay in labs due to difficulty obtaining EKG Per patient's family, patient has "an electrical stimulator in her back" due to chronic back pain issues

## 2014-11-08 NOTE — ED Notes (Signed)
Patient and family advised we are in need of urine sample. Patient given additional cup of water by Shelleyarmen, NT. Patient advised if she has not been able to provide a urine sample by 2030 we will need to start an IV and give her IV fluids.

## 2014-11-08 NOTE — ED Notes (Signed)
PTAR requested 

## 2014-11-08 NOTE — ED Notes (Signed)
Bed: WA01 Expected date:  Expected time:  Means of arrival:  Comments: Hall A 

## 2014-11-08 NOTE — ED Notes (Signed)
Per EMS, patient has been refusing to take her medications Patient has only had her AM medications today

## 2014-11-08 NOTE — ED Notes (Signed)
MD at bedside. 

## 2014-11-08 NOTE — Discharge Instructions (Signed)

## 2014-11-08 NOTE — ED Notes (Signed)
Patient asking for ice water After OK with EDP, patient given ice water--tolerated well without issue Order for IV bolus noted Per EDP, if patient is able to provide urine specimen, IVF not needed Phlebotomy at bedside Will obtain urine from patient when labs are complete

## 2014-11-08 NOTE — ED Notes (Addendum)
[facility]  Patient told caregivers that she was having a heart attack and that she needed to "go on the toilet" Patient stated that her chest hurt, but pain subsided after having a large BM EMS states that patient was still on the toilet when they arrived--patient pain free, but noted to be anxious EMS was initially going to refuse transport, but then daughter wanted patient to be seen due to increasing confusion Patient's family wants "an overall eval" Patient has hx of dementia Patient with NO specific complaints at this time

## 2014-11-12 LAB — I-STAT TROPONIN, ED: Troponin i, poc: 0.05 ng/mL (ref 0.00–0.08)

## 2014-12-04 ENCOUNTER — Encounter (HOSPITAL_COMMUNITY): Payer: Self-pay | Admitting: *Deleted

## 2014-12-04 ENCOUNTER — Other Ambulatory Visit (HOSPITAL_COMMUNITY): Payer: Self-pay | Admitting: Cardiology

## 2014-12-04 DIAGNOSIS — I714 Abdominal aortic aneurysm, without rupture, unspecified: Secondary | ICD-10-CM

## 2015-01-01 ENCOUNTER — Encounter (HOSPITAL_COMMUNITY): Payer: Medicare Other

## 2015-01-14 IMAGING — CR DG CHEST 1V PORT
1 series · 1 of 1 positions shown · non-contrast
Comparison: Chest 03/25/2013 and 03/26/2013.

CLINICAL DATA: Shortness of breath.

PORTABLE CHEST - 1 VIEW

[AP]
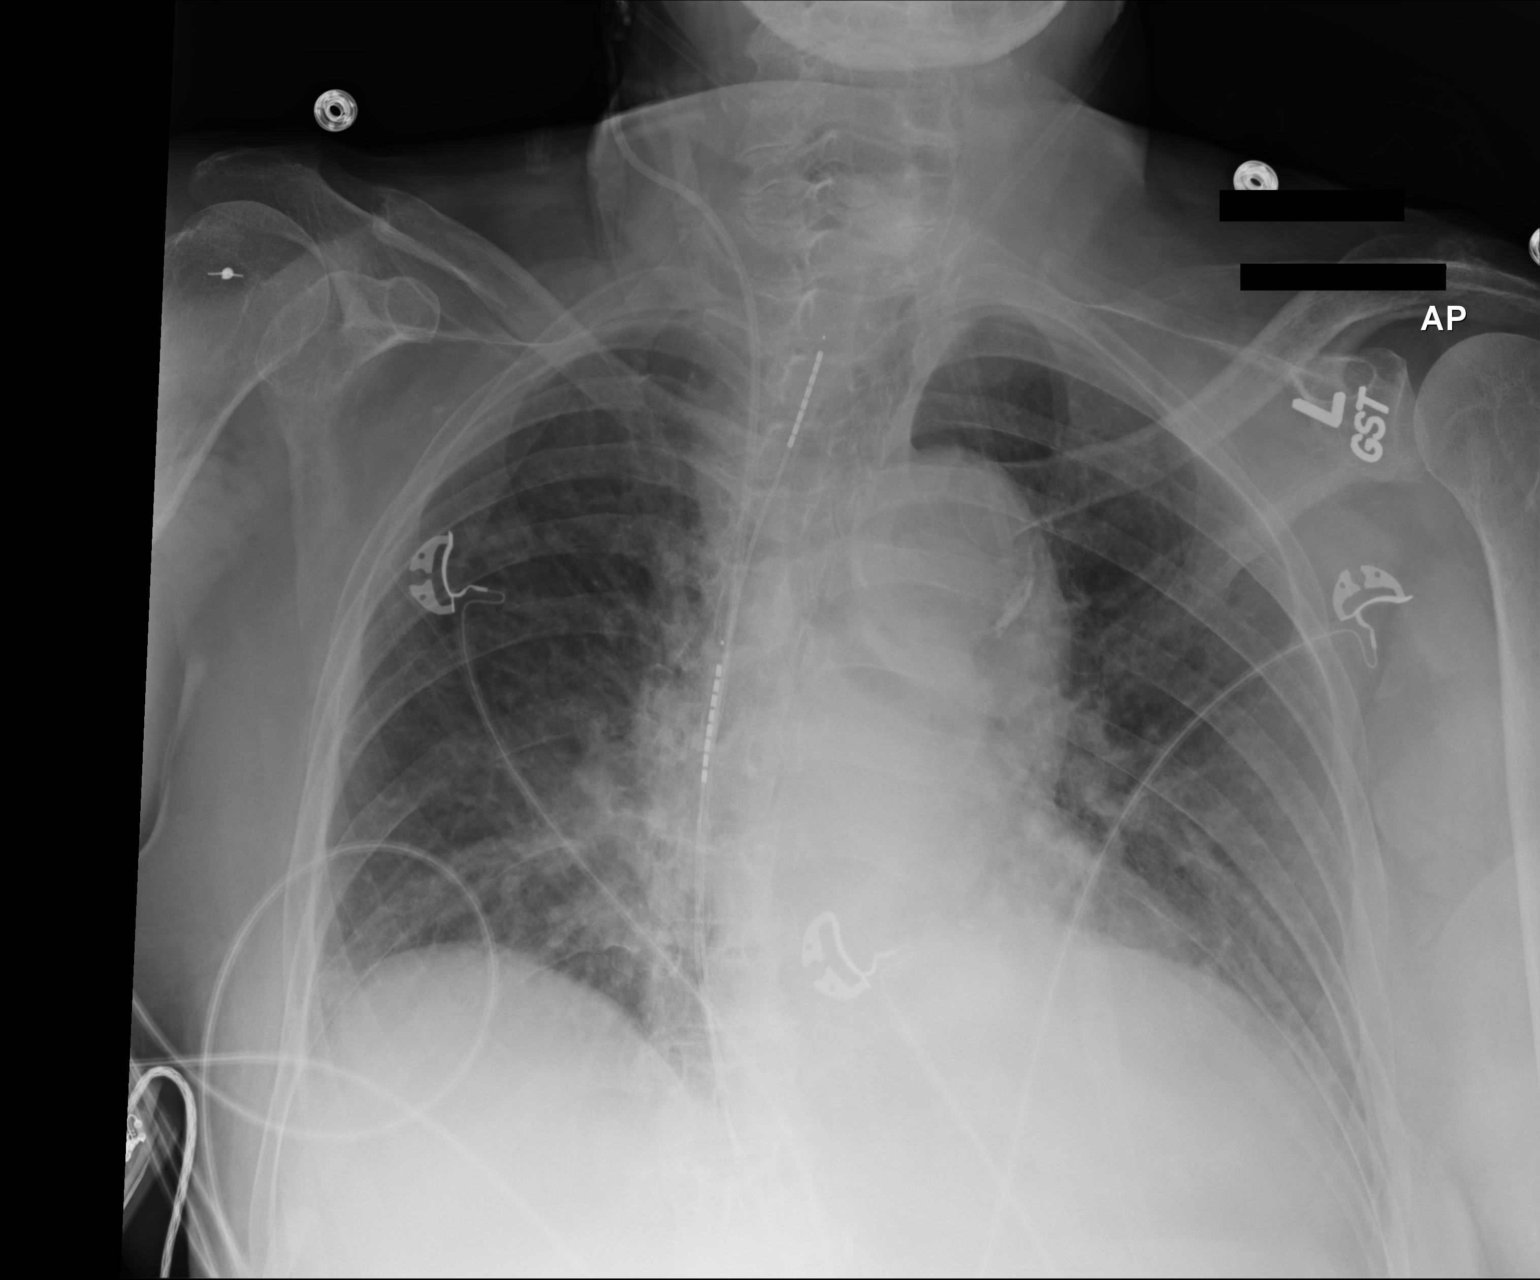

[1 of 1 positions shown; findings below may reference images not displayed]

FINDINGS: There has been increase in bibasilar atelectasis.
Cardiomegaly and mild vascular congestion noted.  Dilated aortic
arch is unchanged.  No pneumothorax or pleural effusion. Spinal
stimulator device in right IJ catheter again noted.
IMPRESSION: Increased bibasilar atelectasis.

## 2015-01-19 IMAGING — CR DG CHEST 1V PORT
1 series · 1 of 1 positions shown · non-contrast
Comparison: 03/30/2013

CLINICAL DATA: Acute shortness of breath

PORTABLE CHEST - 1 VIEW

[AP]
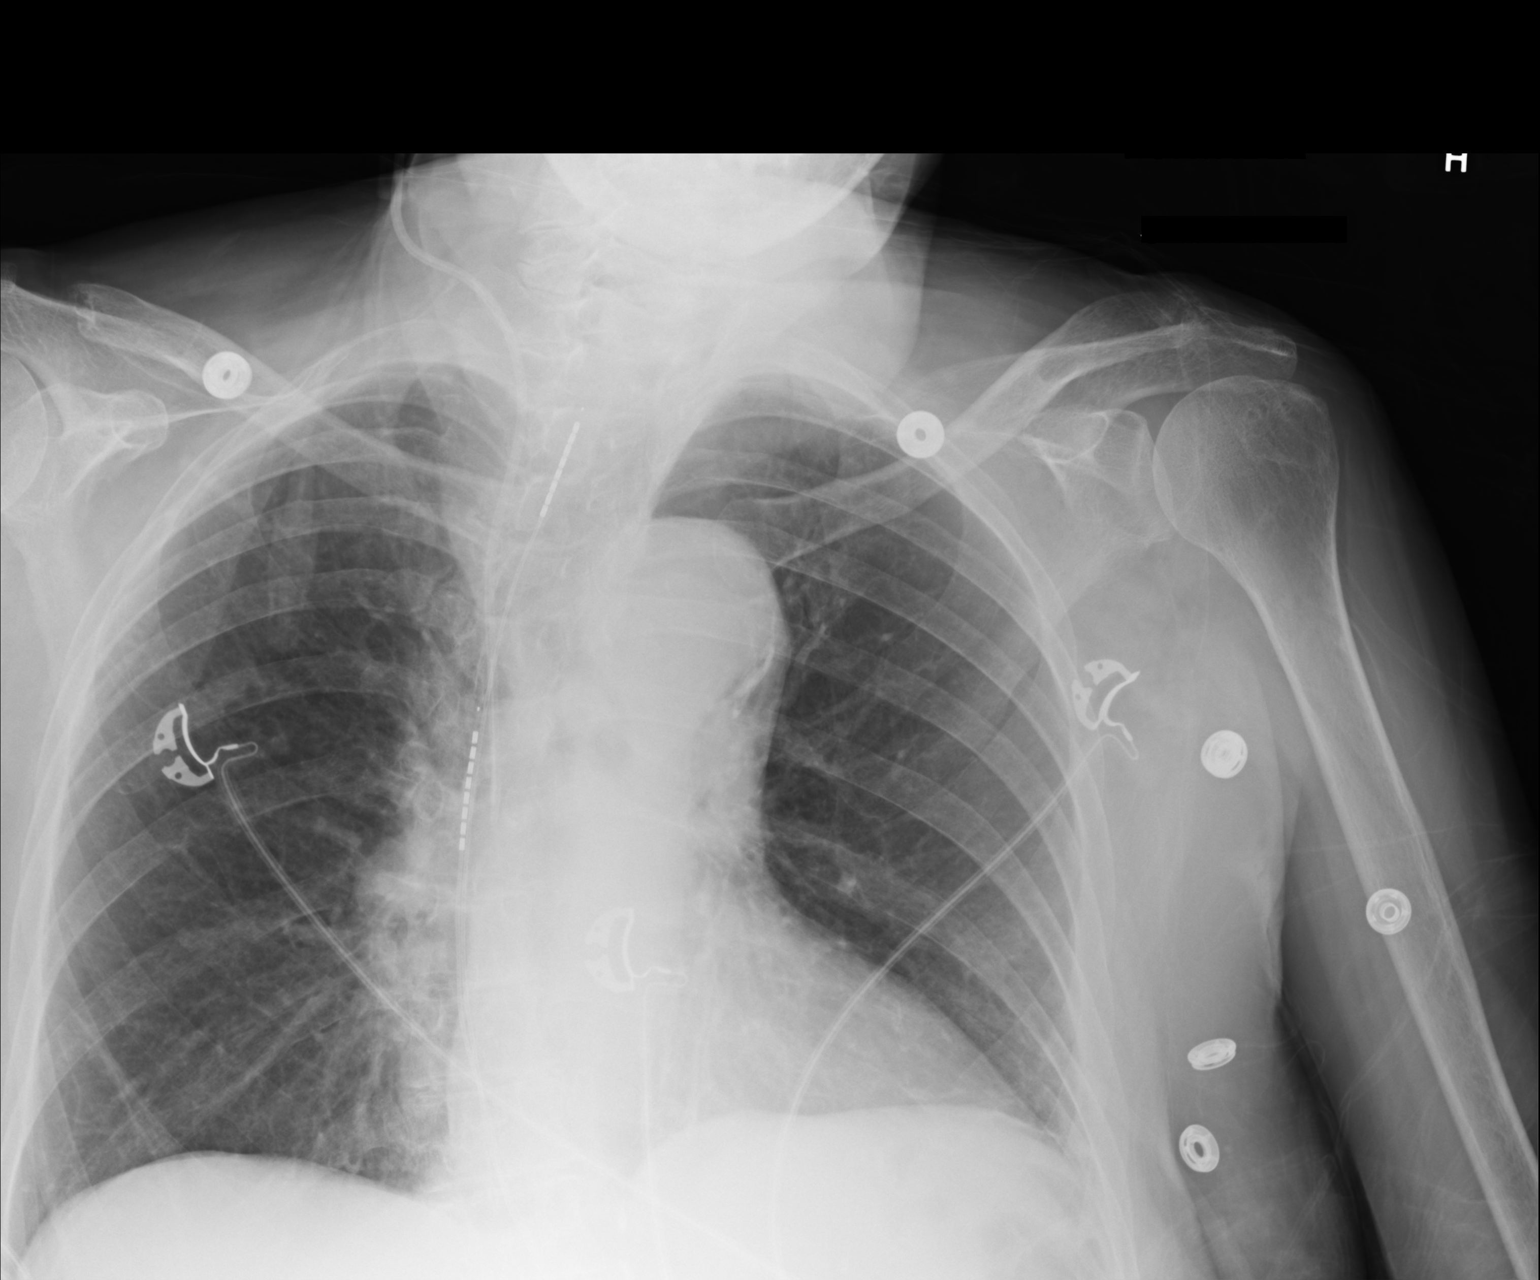

[1 of 1 positions shown; findings below may reference images not displayed]

FINDINGS: Neural stimulator again noted.  Right IJ central line tip
over mid to distal SVC.  Heart size normal.  Aortic ectasia again
noted.  Curvilinear left lower lobe probable atelectasis noted.
Right costophrenic angle is omitted from the field of view of the
right lung is grossly clear. Evidence of resection or osteolysis of
the distal right clavicle again noted.
IMPRESSION: Curvilinear left lower lobe atelectasis.

## 2015-01-22 ENCOUNTER — Encounter (HOSPITAL_COMMUNITY): Payer: Medicare Other

## 2015-01-29 ENCOUNTER — Ambulatory Visit (HOSPITAL_COMMUNITY)
Admission: RE | Admit: 2015-01-29 | Discharge: 2015-01-29 | Disposition: A | Discharge status: Home / Self Care | Payer: Medicare Other | Source: Ambulatory Visit | Attending: Cardiovascular Disease | Admitting: Cardiovascular Disease

## 2015-01-29 ENCOUNTER — Other Ambulatory Visit: Payer: Self-pay | Admitting: *Deleted

## 2015-01-29 DIAGNOSIS — I714 Abdominal aortic aneurysm, without rupture, unspecified: Secondary | ICD-10-CM

## 2015-01-29 NOTE — Progress Notes (Signed)
Abdominal Aortic Duplex Completed. AAA has increased in size from 4.5 x 4.2 centimeters to 5.8 x 5.7 centimeters. Brianna L Mazza,RVT

## 2015-01-31 ENCOUNTER — Telehealth: Payer: Self-pay | Admitting: Cardiology

## 2015-01-31 NOTE — Telephone Encounter (Signed)
SPOKE TO DAUGHTER RN INFORMED HER NO CALL WAS DONE BY RN. DAUGHTER STATES SHE RECEIVED A CALL YESTERDAY FROM OFFICE  RN REVIEWED APPOINTMENT FOR F/U  - SCHEDULER SHAWNEE CALLED - CALL TRANSFERRED.

## 2015-01-31 NOTE — Telephone Encounter (Signed)
Returning call,she says it might be about her ultrasound results.

## 2015-03-04 ENCOUNTER — Encounter: Payer: Self-pay | Admitting: Vascular Surgery

## 2015-03-05 ENCOUNTER — Ambulatory Visit (INDEPENDENT_AMBULATORY_CARE_PROVIDER_SITE_OTHER): Payer: Medicare Other | Admitting: Vascular Surgery

## 2015-03-05 ENCOUNTER — Encounter: Payer: Self-pay | Admitting: Vascular Surgery

## 2015-03-05 VITALS — BP 127/81 | HR 89 | Resp 14 | Ht 64.0 in | Wt 143.0 lb

## 2015-03-05 DIAGNOSIS — I714 Abdominal aortic aneurysm, without rupture, unspecified: Secondary | ICD-10-CM

## 2015-03-05 DIAGNOSIS — Z01810 Encounter for preprocedural cardiovascular examination: Secondary | ICD-10-CM | POA: Diagnosis not present

## 2015-03-05 NOTE — Progress Notes (Signed)
New AAA   Reason for referral: AAA  History of Present Illness  The patient is a 81 y.o. (Apr 17, 1925) female who presents with chief complaint: abdominal aortic aneurysm.  This has been followed by her cardiologist, Dr. Herbie Baltimore. A previous abdominal CT in February 2015, revealed a AAA measuring 4.9 cm. She had a recent duplex on 01/29/15 that measured a AAA of 5.8 cm. She denies any abdominal or back pain. She has a past medical history significant for CVA and paroxysmal atrial fibrillation and paroxysmal atrial tachycardia. She is not currently on anticoagulation due to history of falls. She is on plavix for stroke prophylaxis. She is also on a statin. She reports feeling intermittently short of breath. She has generalized weakness preventing her from walking much. She lives at home. She recently lost her husband in December 2015. She has a history of dementia. She is a former smoker.   Past Medical History  Diagnosis Date  . GERD (gastroesophageal reflux disease)   . Depression   . Neuropathy   . Constipation, chronic   . Insomnia   . Chronic pain   . Hypothyroid   . Anxiety   . Frequent falls     "not much in the last few months" (11/08/2013)  . PAF (paroxysmal atrial fibrillation)     not on anticoagulation due to a fall risk, wore an Event monitor 02//23/11-03/03/11  . PAT (paroxysmal atrial tachycardia)     Supraventricular tachycardia; most recent episode associated with sepsis  . Hypertension   . AAA (abdominal aortic aneurysm) without rupture     Followed by routine Dopplers; ~4.9 cm diameter by CT abdomen February 2015  . Spinal stenosis   . Complication of anesthesia     "it's hard for me to wakeup" (11/08/2013)  . Pneumonia     "several years ago" (11/08/2013)  . Chronic bronchitis     "hasn't had it in quite a few years" (11/08/2013)  . Shortness of breath     "can come about at anytime" (11/08/2013)  . Sleep apnea     "doesn't wear a mask; RX'd but wouldn't get one"  (11/08/2013)  . Headache(784.0)     "at least weekly; sometimes daily" (11/08/2013)  . Stroke ?1980's    denies residual on 11/08/2013  . DDD (degenerative disc disease)     "all of her spine" (11/08/2013)  . Arthritis     "joints; back; hands are getting pretty bad" (11/08/2013)  . Chronic lower back pain   . Chronic leg pain     "nerve pain" (11/08/2013)  . Dementia     "confusion comes and goes; mostly situational; does not have good short term memory but won't admit it" (11/08/2013)    Past Surgical History  Procedure Laterality Date  . Nm myocar perf wall motion  June 2014    No ischemia or infarction, mild apical breast attenuation, EF roughly 70%  . Doppler echocardiography  03/2013; 04/2014    Normal EF 60-65%. Mild/moderate aortic regurgitation, mildly dilated RV with moderate pulmonary hypertension; b. EF 60-65%, Gr 1 DD, Mod Ao Sclerosis - no stenosis, Mild AI, normal PAP  . Abdomnal aorta  02/25/2012    mid abd. aorta  mid segment diltaton 3.93 x 4.55 cm greatest diameter.mild amt atherosclerosis without evidence of sigificant diamter reduction  . Lower arterial extremities doppler  02/19/2011    right abi 0.98,left 0.97  . Renal doppler  02/12/2011    abd aorta prox 2.6 x 3.1 cm,mid 4.1  x 3.6 cm, distal 2.3 x 3.4 cm, right renal artery 1-59% left renal artery norm. ,both renal size normal  . Renal doppler  02/11/2010    infrarenal AAA mearsuring 3.6 x 3.7 cm  . Appendectomy    . Cholecystectomy    . Eye surgery    . Lumbar disc surgery  X 3  . Tonsillectomy    . Knee arthroscopy Right ?1980's  . Vaginal hysterectomy    . Dilation and curettage of uterus      "a few" (11/08/2013)  . Cataract extraction w/ intraocular lens  implant, bilateral Bilateral 2000's  . Anterior cervical decomp/discectomy fusion      History   Social History  . Marital Status: Married    Spouse Name: N/A  . Number of Children: N/A  . Years of Education: N/A   Occupational History  . Not on  file.   Social History Main Topics  . Smoking status: Former Smoker -- 0.50 packs/day for 10 years    Types: Cigarettes  . Smokeless tobacco: Never Used  . Alcohol Use: 0.0 oz/week    0 Standard drinks or equivalent per week     Comment: 11/08/2013 "used to drink some; never had problem w/it; haven't had a drink in years"  . Drug Use: No  . Sexual Activity: No   Other Topics Concern  . Not on file   Social History Narrative   She is a married mother of 2, grandmother 4 with now 2 great-grandchildren. She routinely exercises, currently using the assistance of home health PT. She does use a walker. She is always accompanied by her daughter, who does say that she is not all that functional sports getting around the house and being much more than just mild activities.   She does not smoke or drink alcohol.    Family History  Problem Relation Age of Onset  . Stroke Mother   . Heart attack Father   . Lung cancer Sister   . Bone cancer Sister   . High blood pressure    . Heart disease      Current Outpatient Prescriptions on File Prior to Visit  Medication Sig Dispense Refill  . atorvastatin (LIPITOR) 10 MG tablet Take 1 tablet (10 mg total) by mouth daily at 6 PM. 30 tablet 1  . Biotin 5000 MCG CAPS Take 5,000 mcg by mouth daily.    . bisacodyl (DULCOLAX) 10 MG suppository Place 10 mg rectally daily as needed for mild constipation or moderate constipation.    . clopidogrel (PLAVIX) 75 MG tablet Take 1 tablet (75 mg total) by mouth daily. 30 tablet 0  . docusate sodium (COLACE) 50 MG capsule Take 50 mg by mouth 2 (two) times daily.     Marland Kitchen. doxycycline (VIBRAMYCIN) 100 MG capsule Take 100 mg by mouth every evening.     . fish oil-omega-3 fatty acids 1000 MG capsule Take 2 g by mouth every morning.     . fluticasone (FLONASE) 50 MCG/ACT nasal spray Place 1 spray into both nostrils daily.    . furosemide (LASIX) 20 MG tablet TAKE 1-2 TABLETS DAILY AS DIRECTED. 60 tablet 0  . furosemide  (LASIX) 20 MG tablet TAKE 1-2 TABLETS DAILY AS DIRECTED. 60 tablet 5  . gabapentin (NEURONTIN) 400 MG capsule Take 1 capsule (400 mg total) by mouth at bedtime.    Marland Kitchen. levothyroxine (SYNTHROID, LEVOTHROID) 75 MCG tablet Take 1 tablet (75 mcg total) by mouth daily before breakfast. 30 tablet 0  .  lidocaine (LIDODERM) 5 % Place 1 patch onto the skin daily. As needed for pain (on back)    . loratadine (CLARITIN) 10 MG tablet Take 10 mg by mouth every evening.    . Melatonin 5 MG TABS Take 10 mg by mouth at bedtime.    . metoprolol succinate (TOPROL-XL) 50 MG 24 hr tablet Take 25 mg by mouth 2 (two) times daily. Take with or immediately following a meal.    . mirabegron ER (MYRBETRIQ) 50 MG TB24 Take 50 mg by mouth every evening.     . mirtazapine (REMERON) 15 MG tablet Take 15 mg by mouth every evening.    . Multiple Vitamins-Minerals (ONE-A-DAY 50 PLUS PO) Take 1 tablet by mouth daily.    . nitrofurantoin (MACRODANTIN) 100 MG capsule Take 100 mg by mouth every evening.     Marland Kitchen oxyCODONE (OXYCONTIN) 10 MG 12 hr tablet Take 10 mg by mouth 2 (two) times daily.     Marland Kitchen oxyCODONE-acetaminophen (PERCOCET/ROXICET) 5-325 MG per tablet Take 1 tablet by mouth 2 (two) times daily as needed for severe pain.    Bertram Gala Glycol-Propyl Glycol (SYSTANE OP) Place 1 drop into both eyes as directed. Three to four times a day. With a compress two times a day    . potassium chloride (K-DUR) 10 MEQ tablet Take 10 mEq by mouth every other day.     . venlafaxine XR (EFFEXOR-XR) 75 MG 24 hr capsule Take 75 mg by mouth 2 (two) times daily.    . Vitamin D, Ergocalciferol, (DRISDOL) 50000 UNITS CAPS Take 50,000 Units by mouth every 7 (seven) days. wednesdays    . zaleplon (SONATA) 5 MG capsule Take 5 mg by mouth at bedtime.    Marland Kitchen acetaminophen (TYLENOL) 500 MG tablet Take 1,000 mg by mouth every 6 (six) hours as needed for mild pain.    Marland Kitchen donepezil (ARICEPT) 5 MG tablet Take 5 mg by mouth at bedtime.     No current  facility-administered medications on file prior to visit.    Allergies  Allergen Reactions  . Clarithromycin Other (See Comments)    Unknown reaction    . Codeine Nausea And Vomiting  . Tequin [Gatifloxacin] Other (See Comments)    Unknown reaction.  Tolerates Levaquin.  Marland Kitchen Penicillins Rash    REVIEW OF SYSTEMS:  (Positives checked otherwise negative)  CARDIOVASCULAR:   chest pain,  chest pressure,  palpitations,  shortness of breath when laying flat,  shortness of breath with exertion,   pain in feet when walking,  pain in feet when laying flat,  history of blood clot in veins (DVT),  history of phlebitis,  swelling in legs,  varicose veins  PULMONARY:   productive cough,  asthma,  wheezing  NEUROLOGIC:   weakness in arms or legs,  numbness in arms or legs,  difficulty speaking or slurred speech,  temporary loss of vision in one eye,  dizziness  HEMATOLOGIC:   bleeding problems,  problems with blood clotting too easily  MUSCULOSKEL:   joint pain,  joint swelling  GASTROINTEST:   vomiting blood,  blood in stool     GENITOURINARY:   burning with urination,  blood in urine  PSYCHIATRIC:   history of major depression  INTEGUMENTARY:   rashes,  ulcers  CONSTITUTIONAL:   fever,  chills  For VQI Use Only  PRE-ADM LIVING: Home  AMB STATUS: Ambulatory with Assistance  CAD Sx: Stable Angina  PRIOR CHF: None  STRESS TEST: [ x] No,   Normal, [ ]  + ischemia, [ ]  + MI, [ ]  Both   Physical Examination  Filed Vitals:   03/05/15 1516  BP: 127/81  Pulse: 89  Resp: 14  Height: 5\' 4"  (1.626 m)  Weight: 143 lb (64.864 kg)   Body mass index is 24.53 kg/(m^2).  General: A&O x 3, WDWN elderly female in NAD  Head: [place]/AT  Pulmonary: Sym exp, good air mov  Cardiac: RRR, no carotid bruits.   Vascular: 2+ radial pulses, 2+ femoral pulses and popliteal pulses bilaterally. No palpable popliteal aneurysm.    Gastrointestinal: soft, NTND, -G/R, - HSM, - masses, no abdominal bruits.   Musculoskeletal:Extremities without ischemic changes.   Neurologic: CN 2-12 grossly intact. Pain and light touch intact in extremities  Psychiatric: Judgment intact, Mood & affect appropriate for pt's clinical situation judgement  Medical Decision Making  The patient is a 81 y.o. female who presents with: asymptomatic abdominal aortic aneurysm that has increased in size. On previous CT abdomen in February 2015, her AAA measured from 4.9. Her recent ultrasound on 01/29/15 measured her aneurysm to be around 5.8 cm. This may be an overestimation in size. Discussed options to continue to watch the aneurysm versus undergoing a CT scan for a more accurate measurement. Options for open and endovascular repair were discussed with the patient and family.  Do not feel that the patient is a candidate for open aneurysm repair based on age and comorbidities. The patient and family have opted to go for a CTA of the abdomen for further evaluation. This will be scheduled in the next few weeks. They will return to see Dr. Arbie Cookey to review results and to discuss a treatment plan.    Maris Berger, PA-C [facility] Office: [phone] Pager: [phone]  03/05/2015, 4:06 PM   This patient was seen in conjunction with Dr. Arbie Cookey  I have examined the patient, reviewed and agree with above. Had long discussion with the patient and her family present. Explained that she may have had an over call of her recent ultrasound which would not be unusual with her tortuosity. Also explained that she may have had a 1 cm growth in one year. CT scan one year ago in February showed 4.9 cm maximal size. Observation only but she is not comfortable with this. We will proceed with CT for further evaluation. He does have elevated BUN/creatinine chronically and therefore will obtain a noncontrast CT  EARLY, TODD,  MD 03/05/2015 4:50 PM

## 2015-03-14 ENCOUNTER — Ambulatory Visit
Admission: RE | Admit: 2015-03-14 | Discharge: 2015-03-14 | Disposition: A | Discharge status: Home / Self Care | Payer: Medicare Other | Source: Ambulatory Visit | Attending: Vascular Surgery | Admitting: Vascular Surgery

## 2015-03-14 DIAGNOSIS — I714 Abdominal aortic aneurysm, without rupture, unspecified: Secondary | ICD-10-CM

## 2015-03-14 DIAGNOSIS — Z01810 Encounter for preprocedural cardiovascular examination: Secondary | ICD-10-CM

## 2015-03-18 ENCOUNTER — Encounter: Payer: Self-pay | Admitting: Vascular Surgery

## 2015-03-19 ENCOUNTER — Ambulatory Visit (INDEPENDENT_AMBULATORY_CARE_PROVIDER_SITE_OTHER): Payer: Medicare Other | Admitting: Vascular Surgery

## 2015-03-19 ENCOUNTER — Encounter: Payer: Self-pay | Admitting: Vascular Surgery

## 2015-03-19 VITALS — BP 131/89 | HR 84 | Resp 16 | Ht 64.0 in | Wt 143.0 lb

## 2015-03-19 DIAGNOSIS — I714 Abdominal aortic aneurysm, without rupture, unspecified: Secondary | ICD-10-CM

## 2015-03-19 NOTE — Progress Notes (Signed)
Resents today for continued discussion regarding her abdominal aortic aneurysm. Been seen by me 2 weeks ago. All ultrasound and shown potentially rapid growth of her infrarenal aneurysm. I had a long discussion with the patient and her daughter and caregiver at that time. Explained with her marked comorbidities and advanced age, would be very difficult to recommend elective repair. She wished further CT evaluation to determine if she had had rapid growth.  Past Medical History  Diagnosis Date  . GERD (gastroesophageal reflux disease)   . Depression   . Neuropathy   . Constipation, chronic   . Insomnia   . Chronic pain   . Hypothyroid   . Anxiety   . Frequent falls     "not much in the last few months" (11/08/2013)  . PAF (paroxysmal atrial fibrillation)     not on anticoagulation due to a fall risk, wore an Event monitor 02//23/11-03/03/11  . PAT (paroxysmal atrial tachycardia)     Supraventricular tachycardia; most recent episode associated with sepsis  . Hypertension   . AAA (abdominal aortic aneurysm) without rupture     Followed by routine Dopplers; ~4.9 cm diameter by CT abdomen February 2015  . Spinal stenosis   . Complication of anesthesia     "it's hard for me to wakeup" (11/08/2013)  . Pneumonia     "several years ago" (11/08/2013)  . Chronic bronchitis     "hasn't had it in quite a few years" (11/08/2013)  . Shortness of breath     "can come about at anytime" (11/08/2013)  . Sleep apnea     "doesn't wear a mask; RX'd but wouldn't get one" (11/08/2013)  . Headache(784.0)     "at least weekly; sometimes daily" (11/08/2013)  . Stroke ?1980's    denies residual on 11/08/2013  . DDD (degenerative disc disease)     "all of her spine" (11/08/2013)  . Arthritis     "joints; back; hands are getting pretty bad" (11/08/2013)  . Chronic lower back pain   . Chronic leg pain     "nerve pain" (11/08/2013)  . Dementia     "confusion comes and goes; mostly situational; does not have good  short term memory but won't admit it" (11/08/2013)    History  Substance Use Topics  . Smoking status: Former Smoker -- 0.50 packs/day for 10 years    Types: Cigarettes  . Smokeless tobacco: Never Used  . Alcohol Use: 0.0 oz/week    0 Standard drinks or equivalent per week     Comment: 11/08/2013 "used to drink some; never had problem w/it; haven't had a drink in years"    Family History  Problem Relation Age of Onset  . Stroke Mother   . Heart attack Father   . Lung cancer Sister   . Bone cancer Sister   . High blood pressure    . Heart disease      Allergies  Allergen Reactions  . Clarithromycin Other (See Comments)    Unknown reaction    . Codeine Nausea And Vomiting  . Tequin [Gatifloxacin] Other (See Comments)    Unknown reaction.  Tolerates Levaquin.  Marland Kitchen Penicillins Rash     Current outpatient prescriptions:  .  acetaminophen (TYLENOL) 500 MG tablet, Take 1,000 mg by mouth every 6 (six) hours as needed for mild pain., Disp: , Rfl:  .  atorvastatin (LIPITOR) 10 MG tablet, Take 1 tablet (10 mg total) by mouth daily at 6 PM., Disp: 30 tablet, Rfl: 1 .  Biotin 5000 MCG CAPS, Take 5,000 mcg by mouth daily., Disp: , Rfl:  .  bisacodyl (DULCOLAX) 10 MG suppository, Place 10 mg rectally daily as needed for mild constipation or moderate constipation., Disp: , Rfl:  .  clopidogrel (PLAVIX) 75 MG tablet, Take 1 tablet (75 mg total) by mouth daily., Disp: 30 tablet, Rfl: 0 .  docusate sodium (COLACE) 50 MG capsule, Take 50 mg by mouth 2 (two) times daily. , Disp: , Rfl:  .  donepezil (ARICEPT) 5 MG tablet, Take 5 mg by mouth at bedtime., Disp: , Rfl:  .  doxycycline (VIBRAMYCIN) 100 MG capsule, Take 100 mg by mouth every evening. , Disp: , Rfl:  .  fish oil-omega-3 fatty acids 1000 MG capsule, Take 2 g by mouth every morning. , Disp: , Rfl:  .  fluticasone (FLONASE) 50 MCG/ACT nasal spray, Place 1 spray into both nostrils daily., Disp: , Rfl:  .  furosemide (LASIX) 20 MG tablet,  TAKE 1-2 TABLETS DAILY AS DIRECTED., Disp: 60 tablet, Rfl: 0 .  furosemide (LASIX) 20 MG tablet, TAKE 1-2 TABLETS DAILY AS DIRECTED., Disp: 60 tablet, Rfl: 5 .  gabapentin (NEURONTIN) 400 MG capsule, Take 1 capsule (400 mg total) by mouth at bedtime., Disp: , Rfl:  .  levothyroxine (SYNTHROID, LEVOTHROID) 75 MCG tablet, Take 1 tablet (75 mcg total) by mouth daily before breakfast., Disp: 30 tablet, Rfl: 0 .  lidocaine (LIDODERM) 5 %, Place 1 patch onto the skin daily. As needed for pain (on back), Disp: , Rfl:  .  loratadine (CLARITIN) 10 MG tablet, Take 10 mg by mouth every evening., Disp: , Rfl:  .  Melatonin 5 MG TABS, Take 10 mg by mouth at bedtime., Disp: , Rfl:  .  metoprolol succinate (TOPROL-XL) 50 MG 24 hr tablet, Take 25 mg by mouth 2 (two) times daily. Take with or immediately following a meal., Disp: , Rfl:  .  mirabegron ER (MYRBETRIQ) 50 MG TB24, Take 50 mg by mouth every evening. , Disp: , Rfl:  .  mirtazapine (REMERON) 15 MG tablet, Take 15 mg by mouth every evening., Disp: , Rfl:  .  Multiple Vitamins-Minerals (ONE-A-DAY 50 PLUS PO), Take 1 tablet by mouth daily., Disp: , Rfl:  .  nitrofurantoin (MACRODANTIN) 100 MG capsule, Take 100 mg by mouth every evening. , Disp: , Rfl:  .  oxyCODONE (OXYCONTIN) 10 MG 12 hr tablet, Take 10 mg by mouth 2 (two) times daily. , Disp: , Rfl:  .  oxyCODONE-acetaminophen (PERCOCET/ROXICET) 5-325 MG per tablet, Take 1 tablet by mouth 2 (two) times daily as needed for severe pain., Disp: , Rfl:  .  Polyethyl Glycol-Propyl Glycol (SYSTANE OP), Place 1 drop into both eyes as directed. Three to four times a day. With a compress two times a day, Disp: , Rfl:  .  potassium chloride (K-DUR) 10 MEQ tablet, Take 10 mEq by mouth every other day. , Disp: , Rfl:  .  venlafaxine XR (EFFEXOR-XR) 75 MG 24 hr capsule, Take 75 mg by mouth 2 (two) times daily., Disp: , Rfl:  .  Vitamin D, Ergocalciferol, (DRISDOL) 50000 UNITS CAPS, Take 50,000 Units by mouth every 7  (seven) days. wednesdays, Disp: , Rfl:  .  zaleplon (SONATA) 5 MG capsule, Take 5 mg by mouth at bedtime., Disp: , Rfl:   Filed Vitals:   03/19/15 1619  BP: 131/89  Pulse: 84  Resp: 16  Height:  (1.626 m)  Weight: 143 lb (64.864 kg)  Body mass index is 24.53 kg/(m^2).       Physical exam is unchanged with a frail-appearing female sitting in a wheelchair. In no distress. Answers questions appropriately.  CT scan from 03/14/2015 was discussed with the patient. This did show maximal diameter 5 cm is 0.9 cm in February 2015. She does have extensive amount of calcification and tortuosity. This was a noncontrast study due to her renal insufficiency. I did explain that it would technically be possible to treat her aneurysm with a stent graft technique. I explained that this would require significant risk to her renal function and otherwise overall function. I explained that this is not that expectantly 6 rapid expansion with a 6 mm growth in 14 months. I did explain the potential that this could rupture take her life. I certainly recommend against elective repair due to her markedly debilitated state and advanced age of 81. She is comfortable with this decision and is comfortable with no ongoing follow-up. I did recommend that if she does suffer ruptured aneurysm and does survive to make hospitalization decision was made not to try attempt emergent treatment since this would undoubtedly lead to markedly decreased quality of life if she survived emergent repair. Patient and family are comfortable with this decision. She will see us again on as-needed basis

## 2015-03-25 ENCOUNTER — Emergency Department (HOSPITAL_COMMUNITY): Payer: Medicare Other

## 2015-03-25 ENCOUNTER — Encounter (HOSPITAL_COMMUNITY): Payer: Self-pay | Admitting: Emergency Medicine

## 2015-03-25 ENCOUNTER — Inpatient Hospital Stay (HOSPITAL_COMMUNITY)
Admission: EM | Admit: 2015-03-25 | Discharge: 2015-03-28 | DRG: 065 | Disposition: A | Discharge status: Hospice | Payer: Medicare Other | Attending: Internal Medicine | Admitting: Internal Medicine

## 2015-03-25 DIAGNOSIS — I480 Paroxysmal atrial fibrillation: Secondary | ICD-10-CM | POA: Diagnosis present

## 2015-03-25 DIAGNOSIS — F0390 Unspecified dementia without behavioral disturbance: Secondary | ICD-10-CM | POA: Diagnosis present

## 2015-03-25 DIAGNOSIS — M549 Dorsalgia, unspecified: Secondary | ICD-10-CM | POA: Diagnosis not present

## 2015-03-25 DIAGNOSIS — G629 Polyneuropathy, unspecified: Secondary | ICD-10-CM | POA: Diagnosis present

## 2015-03-25 DIAGNOSIS — H5340 Unspecified visual field defects: Secondary | ICD-10-CM | POA: Diagnosis present

## 2015-03-25 DIAGNOSIS — Z79899 Other long term (current) drug therapy: Secondary | ICD-10-CM | POA: Diagnosis not present

## 2015-03-25 DIAGNOSIS — M545 Low back pain: Secondary | ICD-10-CM | POA: Diagnosis present

## 2015-03-25 DIAGNOSIS — G4700 Insomnia, unspecified: Secondary | ICD-10-CM | POA: Diagnosis present

## 2015-03-25 DIAGNOSIS — Z87891 Personal history of nicotine dependence: Secondary | ICD-10-CM

## 2015-03-25 DIAGNOSIS — N183 Chronic kidney disease, stage 3 (moderate): Secondary | ICD-10-CM | POA: Diagnosis present

## 2015-03-25 DIAGNOSIS — G8194 Hemiplegia, unspecified affecting left nondominant side: Secondary | ICD-10-CM | POA: Diagnosis present

## 2015-03-25 DIAGNOSIS — Z66 Do not resuscitate: Secondary | ICD-10-CM | POA: Diagnosis present

## 2015-03-25 DIAGNOSIS — R531 Weakness: Secondary | ICD-10-CM | POA: Diagnosis not present

## 2015-03-25 DIAGNOSIS — M1990 Unspecified osteoarthritis, unspecified site: Secondary | ICD-10-CM | POA: Diagnosis present

## 2015-03-25 DIAGNOSIS — I714 Abdominal aortic aneurysm, without rupture: Secondary | ICD-10-CM | POA: Diagnosis present

## 2015-03-25 DIAGNOSIS — Z79891 Long term (current) use of opiate analgesic: Secondary | ICD-10-CM

## 2015-03-25 DIAGNOSIS — F419 Anxiety disorder, unspecified: Secondary | ICD-10-CM | POA: Diagnosis present

## 2015-03-25 DIAGNOSIS — Z9841 Cataract extraction status, right eye: Secondary | ICD-10-CM | POA: Diagnosis not present

## 2015-03-25 DIAGNOSIS — I129 Hypertensive chronic kidney disease with stage 1 through stage 4 chronic kidney disease, or unspecified chronic kidney disease: Secondary | ICD-10-CM | POA: Diagnosis present

## 2015-03-25 DIAGNOSIS — I63511 Cerebral infarction due to unspecified occlusion or stenosis of right middle cerebral artery: Secondary | ICD-10-CM | POA: Diagnosis present

## 2015-03-25 DIAGNOSIS — K219 Gastro-esophageal reflux disease without esophagitis: Secondary | ICD-10-CM | POA: Diagnosis present

## 2015-03-25 DIAGNOSIS — F329 Major depressive disorder, single episode, unspecified: Secondary | ICD-10-CM | POA: Diagnosis present

## 2015-03-25 DIAGNOSIS — I639 Cerebral infarction, unspecified: Secondary | ICD-10-CM | POA: Diagnosis not present

## 2015-03-25 DIAGNOSIS — N289 Disorder of kidney and ureter, unspecified: Secondary | ICD-10-CM

## 2015-03-25 DIAGNOSIS — R29810 Facial weakness: Secondary | ICD-10-CM | POA: Diagnosis present

## 2015-03-25 DIAGNOSIS — Z515 Encounter for palliative care: Secondary | ICD-10-CM | POA: Diagnosis not present

## 2015-03-25 DIAGNOSIS — Z961 Presence of intraocular lens: Secondary | ICD-10-CM | POA: Diagnosis present

## 2015-03-25 DIAGNOSIS — Z981 Arthrodesis status: Secondary | ICD-10-CM | POA: Diagnosis not present

## 2015-03-25 DIAGNOSIS — Z8673 Personal history of transient ischemic attack (TIA), and cerebral infarction without residual deficits: Secondary | ICD-10-CM | POA: Diagnosis not present

## 2015-03-25 DIAGNOSIS — Z7902 Long term (current) use of antithrombotics/antiplatelets: Secondary | ICD-10-CM

## 2015-03-25 DIAGNOSIS — G4733 Obstructive sleep apnea (adult) (pediatric): Secondary | ICD-10-CM | POA: Diagnosis present

## 2015-03-25 DIAGNOSIS — I6330 Cerebral infarction due to thrombosis of unspecified cerebral artery: Secondary | ICD-10-CM | POA: Diagnosis present

## 2015-03-25 DIAGNOSIS — Z9181 History of falling: Secondary | ICD-10-CM | POA: Diagnosis not present

## 2015-03-25 DIAGNOSIS — E785 Hyperlipidemia, unspecified: Secondary | ICD-10-CM | POA: Diagnosis present

## 2015-03-25 DIAGNOSIS — R1312 Dysphagia, oropharyngeal phase: Secondary | ICD-10-CM | POA: Diagnosis present

## 2015-03-25 DIAGNOSIS — Z9842 Cataract extraction status, left eye: Secondary | ICD-10-CM | POA: Diagnosis not present

## 2015-03-25 DIAGNOSIS — R471 Dysarthria and anarthria: Secondary | ICD-10-CM | POA: Diagnosis present

## 2015-03-25 DIAGNOSIS — I6789 Other cerebrovascular disease: Secondary | ICD-10-CM | POA: Diagnosis not present

## 2015-03-25 DIAGNOSIS — E039 Hypothyroidism, unspecified: Secondary | ICD-10-CM | POA: Diagnosis present

## 2015-03-25 DIAGNOSIS — I10 Essential (primary) hypertension: Secondary | ICD-10-CM | POA: Diagnosis not present

## 2015-03-25 DIAGNOSIS — F1190 Opioid use, unspecified, uncomplicated: Secondary | ICD-10-CM | POA: Insufficient documentation

## 2015-03-25 DIAGNOSIS — G8929 Other chronic pain: Secondary | ICD-10-CM | POA: Diagnosis present

## 2015-03-25 LAB — I-STAT CHEM 8, ED
BUN: 21 mg/dL (ref 6–23)
CREATININE: 1.3 mg/dL — AB (ref 0.50–1.10)
Calcium, Ion: 1.11 mmol/L — ABNORMAL LOW (ref 1.13–1.30)
Chloride: 102 mmol/L (ref 96–112)
Glucose, Bld: 113 mg/dL — ABNORMAL HIGH (ref 70–99)
HCT: 42 % (ref 36.0–46.0)
Hemoglobin: 14.3 g/dL (ref 12.0–15.0)
POTASSIUM: 3.9 mmol/L (ref 3.5–5.1)
Sodium: 136 mmol/L (ref 135–145)
TCO2: 19 mmol/L (ref 0–100)

## 2015-03-25 LAB — DIFFERENTIAL
Basophils Absolute: 0.1 10*3/uL (ref 0.0–0.1)
Basophils Relative: 1 % (ref 0–1)
Eosinophils Absolute: 0.2 10*3/uL (ref 0.0–0.7)
Eosinophils Relative: 2 % (ref 0–5)
LYMPHS ABS: 1.9 10*3/uL (ref 0.7–4.0)
LYMPHS PCT: 17 % (ref 12–46)
MONO ABS: 1 10*3/uL (ref 0.1–1.0)
MONOS PCT: 8 % (ref 3–12)
NEUTROS ABS: 8.4 10*3/uL — AB (ref 1.7–7.7)
Neutrophils Relative %: 72 % (ref 43–77)

## 2015-03-25 LAB — COMPREHENSIVE METABOLIC PANEL
ALBUMIN: 3.7 g/dL (ref 3.5–5.2)
ALT: 14 U/L (ref 0–35)
AST: 19 U/L (ref 0–37)
Alkaline Phosphatase: 76 U/L (ref 39–117)
Anion gap: 14 (ref 5–15)
BUN: 17 mg/dL (ref 6–23)
CALCIUM: 9.2 mg/dL (ref 8.4–10.5)
CO2: 20 mmol/L (ref 19–32)
CREATININE: 1.4 mg/dL — AB (ref 0.50–1.10)
Chloride: 102 mmol/L (ref 96–112)
GFR calc Af Amer: 38 mL/min — ABNORMAL LOW (ref >90)
GFR calc non Af Amer: 32 mL/min — ABNORMAL LOW (ref >90)
GLUCOSE: 112 mg/dL — AB (ref 70–99)
Potassium: 4 mmol/L (ref 3.5–5.1)
Sodium: 136 mmol/L (ref 135–145)
TOTAL PROTEIN: 6.5 g/dL (ref 6.0–8.3)
Total Bilirubin: 0.7 mg/dL (ref 0.3–1.2)

## 2015-03-25 LAB — PROTIME-INR
INR: 0.99 (ref 0.00–1.49)
PROTHROMBIN TIME: 13.2 s (ref 11.6–15.2)

## 2015-03-25 LAB — CBC
HCT: 38.3 % (ref 36.0–46.0)
Hemoglobin: 13.3 g/dL (ref 12.0–15.0)
MCH: 30.2 pg (ref 26.0–34.0)
MCHC: 34.7 g/dL (ref 30.0–36.0)
MCV: 86.8 fL (ref 78.0–100.0)
PLATELETS: 279 10*3/uL (ref 150–400)
RBC: 4.41 MIL/uL (ref 3.87–5.11)
RDW: 14.1 % (ref 11.5–15.5)
WBC: 11.6 10*3/uL — AB (ref 4.0–10.5)

## 2015-03-25 LAB — ETHANOL: Alcohol, Ethyl (B): <5 mg/dL (ref 0–9)

## 2015-03-25 LAB — I-STAT TROPONIN, ED: Troponin i, poc: 0.01 ng/mL (ref 0.00–0.08)

## 2015-03-25 LAB — APTT: aPTT: 28 seconds (ref 24–37)

## 2015-03-25 MED ORDER — LEVOTHYROXINE SODIUM 100 MCG IV SOLR
37.5000 ug | Freq: Every day | INTRAVENOUS | Status: DC
  Administered 2015-03-26: 37.5 ug via INTRAVENOUS
  Filled 2015-03-25 (×3): qty 5

## 2015-03-25 MED ORDER — METOPROLOL TARTRATE 1 MG/ML IV SOLN
5.0000 mg | Freq: Four times a day (QID) | INTRAVENOUS | Status: DC
  Administered 2015-03-25 – 2015-03-27 (×6): 5 mg via INTRAVENOUS
  Filled 2015-03-25 (×6): qty 5

## 2015-03-25 MED ORDER — ASPIRIN 325 MG PO TABS: 325.0000 mg | ORAL_TABLET | Freq: Every day | ORAL | Status: DC

## 2015-03-25 MED ORDER — HEPARIN SODIUM (PORCINE) 5000 UNIT/ML IJ SOLN
5000.0000 [IU] | Freq: Three times a day (TID) | INTRAMUSCULAR | Status: DC
  Administered 2015-03-25 – 2015-03-27 (×5): 5000 [IU] via SUBCUTANEOUS
  Filled 2015-03-25 (×5): qty 1

## 2015-03-25 MED ORDER — ASPIRIN 300 MG RE SUPP
300.0000 mg | Freq: Every day | RECTAL | Status: DC
  Administered 2015-03-25 – 2015-03-26 (×2): 300 mg via RECTAL
  Filled 2015-03-25 (×2): qty 1

## 2015-03-25 MED ORDER — MORPHINE SULFATE 2 MG/ML IJ SOLN
1.0000 mg | INTRAMUSCULAR | Status: DC
  Administered 2015-03-25 – 2015-03-27 (×10): 1 mg via INTRAVENOUS
  Filled 2015-03-25 (×10): qty 1

## 2015-03-25 MED ORDER — STROKE: EARLY STAGES OF RECOVERY BOOK
Freq: Once | Status: AC
  Administered 2015-03-25: 22:00:00
  Filled 2015-03-25: qty 1

## 2015-03-25 NOTE — H&P (Addendum)
[facility] History and Physical  Chelsea Lynch ZOX:[id number] DOB: Dec 21, 1924 DOA: 03/25/2015  Referring physician: EDP PCP: [name], MD   Chief Complaint: Left sided weakness   HPI: Chelsea Lynch is a 81 y.o. female with h/o mild dementia, prior strokes, left sided weakness.  Patient presents to the ED today with acute worsening of symptoms.  She had been having left arm weakness for past 2 weeks, but today developed R gaze preference, left facial droop and left sided weakness, and therefore was brought to ED by family today.  At baseline patient has a 24/7 caregiver at home.  Review of Systems: Systems reviewed.  As above, otherwise negative  Past Medical History  Diagnosis Date  . GERD (gastroesophageal reflux disease)   . Depression   . Neuropathy   . Constipation, chronic   . Insomnia   . Chronic pain   . Hypothyroid   . Anxiety   . Frequent falls     "not much in the last few months" (11/08/2013)  . PAF (paroxysmal atrial fibrillation)     not on anticoagulation due to a fall risk, wore an Event monitor 02//23/11-03/03/11  . PAT (paroxysmal atrial tachycardia)     Supraventricular tachycardia; most recent episode associated with sepsis  . Hypertension   . AAA (abdominal aortic aneurysm) without rupture     Followed by routine Dopplers; ~4.9 cm diameter by CT abdomen February 2015  . Spinal stenosis   . Complication of anesthesia     "it's hard for me to wakeup" (11/08/2013)  . Pneumonia     "several years ago" (11/08/2013)  . Chronic bronchitis     "hasn't had it in quite a few years" (11/08/2013)  . Shortness of breath     "can come about at anytime" (11/08/2013)  . Sleep apnea     "doesn't wear a mask; RX'd but wouldn't get one" (11/08/2013)  . Headache(784.0)     "at least weekly; sometimes daily" (11/08/2013)  . Stroke ?1980's    denies residual on 11/08/2013  . DDD (degenerative disc disease)     "all of her spine" (11/08/2013)  . Arthritis      "joints; back; hands are getting pretty bad" (11/08/2013)  . Chronic lower back pain   . Chronic leg pain     "nerve pain" (11/08/2013)  . Dementia     "confusion comes and goes; mostly situational; does not have good short term memory but won't admit it" (11/08/2013)   Past Surgical History  Procedure Laterality Date  . Nm myocar perf wall motion  June 2014    No ischemia or infarction, mild apical breast attenuation, EF roughly 70%  . Doppler echocardiography  03/2013; 04/2014    Normal EF 60-65%. Mild/moderate aortic regurgitation, mildly dilated RV with moderate pulmonary hypertension; b. EF 60-65%, Gr 1 DD, Mod Ao Sclerosis - no stenosis, Mild AI, normal PAP  . Abdomnal aorta  02/25/2012    mid abd. aorta  mid segment diltaton 3.93 x 4.55 cm greatest diameter.mild amt atherosclerosis without evidence of sigificant diamter reduction  . Lower arterial extremities doppler  02/19/2011    right abi 0.98,left 0.97  . Renal doppler  02/12/2011    abd aorta prox 2.6 x 3.1 cm,mid 4.1 x 3.6 cm, distal 2.3 x 3.4 cm, right renal artery 1-59% left renal artery norm. ,both renal size normal  . Renal doppler  02/11/2010    infrarenal AAA mearsuring 3.6 x 3.7 cm  . Appendectomy    .  Cholecystectomy    . Eye surgery    . Lumbar disc surgery  X 3  . Tonsillectomy    . Knee arthroscopy Right ?1980's  . Vaginal hysterectomy    . Dilation and curettage of uterus      "a few" (11/08/2013)  . Cataract extraction w/ intraocular lens  implant, bilateral Bilateral 2000's  . Anterior cervical decomp/discectomy fusion     Social History:  reports that she has quit smoking. Her smoking use included Cigarettes. She has a 5 pack-year smoking history. She has never used smokeless tobacco. She reports that she drinks alcohol. She reports that she does not use illicit drugs.  Allergies  Allergen Reactions  . Clarithromycin Other (See Comments)    Unknown reaction    . Codeine Nausea And Vomiting  . Tequin  [Gatifloxacin] Other (See Comments)    Unknown reaction.  Tolerates Levaquin.  Marland Kitchen Penicillins Rash    Family History  Problem Relation Age of Onset  . Stroke Mother   . Heart attack Father   . Lung cancer Sister   . Bone cancer Sister   . High blood pressure    . Heart disease       Prior to Admission medications   Medication Sig Start Date End Date Taking? Authorizing Provider  acetaminophen (TYLENOL) 500 MG tablet Take 1,000 mg by mouth every 6 (six) hours as needed for mild pain.    Historical Provider, MD  atorvastatin (LIPITOR) 10 MG tablet Take 1 tablet (10 mg total) by mouth daily at 6 PM. 11/10/13   Esperanza Sheets, MD  Biotin 5000 MCG CAPS Take 5,000 mcg by mouth daily.    Historical Provider, MD  bisacodyl (DULCOLAX) 10 MG suppository Place 10 mg rectally daily as needed for mild constipation or moderate constipation.    Historical Provider, MD  clopidogrel (PLAVIX) 75 MG tablet Take 1 tablet (75 mg total) by mouth daily. 06/28/14   Kela Millin, MD  docusate sodium (COLACE) 50 MG capsule Take 50 mg by mouth 2 (two) times daily.     Historical Provider, MD  donepezil (ARICEPT) 5 MG tablet Take 5 mg by mouth at bedtime.    Historical Provider, MD  doxycycline (VIBRAMYCIN) 100 MG capsule Take 100 mg by mouth every evening.  09/04/14   Historical Provider, MD  fish oil-omega-3 fatty acids 1000 MG capsule Take 2 g by mouth every morning.     Historical Provider, MD  fluticasone (FLONASE) 50 MCG/ACT nasal spray Place 1 spray into both nostrils daily.    Historical Provider, MD  furosemide (LASIX) 20 MG tablet TAKE 1-2 TABLETS DAILY AS DIRECTED. 09/19/14   Marykay Lex, MD  furosemide (LASIX) 20 MG tablet TAKE 1-2 TABLETS DAILY AS DIRECTED. 10/30/14   Marykay Lex, MD  gabapentin (NEURONTIN) 400 MG capsule Take 1 capsule (400 mg total) by mouth at bedtime. 06/28/14   Kela Millin, MD  levothyroxine (SYNTHROID, LEVOTHROID) 75 MCG tablet Take 1 tablet (75 mcg total) by mouth  daily before breakfast. 04/06/13   Joseph Art, DO  lidocaine (LIDODERM) 5 % Place 1 patch onto the skin daily. As needed for pain (on back)    Historical Provider, MD  loratadine (CLARITIN) 10 MG tablet Take 10 mg by mouth every evening.    Historical Provider, MD  Melatonin 5 MG TABS Take 10 mg by mouth at bedtime.    Historical Provider, MD  metoprolol succinate (TOPROL-XL) 50 MG 24 hr tablet Take  25 mg by mouth 2 (two) times daily. Take with or immediately following a meal. 04/06/13   Joseph Art, DO  mirabegron ER (MYRBETRIQ) 50 MG TB24 Take 50 mg by mouth every evening.     Historical Provider, MD  mirtazapine (REMERON) 15 MG tablet Take 15 mg by mouth every evening.    Historical Provider, MD  Multiple Vitamins-Minerals (ONE-A-DAY 50 PLUS PO) Take 1 tablet by mouth daily.    Historical Provider, MD  nitrofurantoin (MACRODANTIN) 100 MG capsule Take 100 mg by mouth every evening.  10/03/14   Historical Provider, MD  oxyCODONE (OXYCONTIN) 10 MG 12 hr tablet Take 10 mg by mouth 2 (two) times daily.     Historical Provider, MD  oxyCODONE-acetaminophen (PERCOCET/ROXICET) 5-325 MG per tablet Take 1 tablet by mouth 2 (two) times daily as needed for severe pain.    Historical Provider, MD  Polyethyl Glycol-Propyl Glycol (SYSTANE OP) Place 1 drop into both eyes as directed. Three to four times a day. With a compress two times a day    Historical Provider, MD  potassium chloride (K-DUR) 10 MEQ tablet Take 10 mEq by mouth every other day.  09/18/14   Historical Provider, MD  venlafaxine XR (EFFEXOR-XR) 75 MG 24 hr capsule Take 75 mg by mouth 2 (two) times daily.    Historical Provider, MD  Vitamin D, Ergocalciferol, (DRISDOL) 50000 UNITS CAPS Take 50,000 Units by mouth every 7 (seven) days. wednesdays    Historical Provider, MD  zaleplon (SONATA) 5 MG capsule Take 5 mg by mouth at bedtime.    Historical Provider, MD   Physical Exam: Filed Vitals:   03/25/15 1942  BP:   Pulse:   Temp: 97.7 F (36.5  C)  Resp:     BP 127/80 mmHg  Pulse 82  Temp(Src) 97.7 F (36.5 C) (Oral)  Resp 21  Wt 64.864 kg (143 lb)  SpO2 97%  General Appearance:    Alert, oriented to person and place but not month or year, no distress, appears stated age  Head:    Normocephalic, atraumatic  Eyes:    PERRL, EOMI, sclera non-icteric        Nose:   Nares without drainage or epistaxis. Mucosa, turbinates normal  Throat:   Moist mucous membranes. Oropharynx without erythema or exudate.  Neck:   Supple. No carotid bruits.  No thyromegaly.  No lymphadenopathy.   Back:     No CVA tenderness, no spinal tenderness  Lungs:     Clear to auscultation bilaterally, without wheezes, rhonchi or rales  Chest wall:    No tenderness to palpitation  Heart:    Regular rate and rhythm without murmurs, gallops, rubs  Abdomen:     Soft, non-tender, nondistended, normal bowel sounds, no organomegaly  Genitalia:    deferred  Rectal:    deferred  Extremities:   No clubbing, cyanosis or edema.  Pulses:   2+ and symmetric all extremities  Skin:   Skin color, texture, turgor normal, no rashes or lesions  Lymph nodes:   Cervical, supraclavicular, and axillary nodes normal  Neurologic:   Neuro exam findings are C/W a dense left MCA territory stroke, specifically patient has: R gaze preference, Profound left sided weakness including no voluntary movement in the left arm which is held in the decorticate position, is able to lift the left leg against gravity, has left sided facial droop.  Has decreased sensation on the left side.  She appears to have left hemineglect.  Labs on Admission:  Basic Metabolic Panel:  Recent Labs Lab 03/25/15 1848 03/25/15 1854  NA 136 136  K 4.0 3.9  CL 102 102  CO2 20  --   GLUCOSE 112* 113*  BUN 17 21  CREATININE 1.40* 1.30*  CALCIUM 9.2  --    Liver Function Tests:  Recent Labs Lab 03/25/15 1848  AST 19  ALT 14  ALKPHOS 76  BILITOT 0.7  PROT 6.5  ALBUMIN 3.7   No results for  input(s): LIPASE, AMYLASE in the last 168 hours. No results for input(s): AMMONIA in the last 168 hours. CBC:  Recent Labs Lab 03/25/15 1848 03/25/15 1854  WBC 11.6*  --   NEUTROABS 8.4*  --   HGB 13.3 14.3  HCT 38.3 42.0  MCV 86.8  --   PLT 279  --    Cardiac Enzymes: No results for input(s): CKTOTAL, CKMB, CKMBINDEX, TROPONINI in the last 168 hours.  BNP (last 3 results)  Recent Labs  11/08/14 1909  PROBNP 1514.0*   CBG: No results for input(s): GLUCAP in the last 168 hours.  Radiological Exams on Admission: Ct Head Wo Contrast  03/25/2015   CLINICAL DATA:  Code stroke  EXAM: CT HEAD WITHOUT CONTRAST  TECHNIQUE: Contiguous axial images were obtained from the base of the skull through the vertex without intravenous contrast.  COMPARISON:  11/08/2014  FINDINGS: Global atrophy. Extensive chronic ischemic changes in the periventricular white matter and bilateral basal ganglia. No mass effect, midline shift, or acute intracranial hemorrhage. Mastoid air cells are clear  IMPRESSION: No acute intracranial pathology.   Electronically Signed   By: Jolaine ClickArthur  Hoss M.D.   On: 03/25/2015 19:13    EKG: Independently reviewed.  Assessment/Plan Principal Problem:   Cerebral thrombosis with cerebral infarction Active Problems:   Backache   Hypothyroidism   Stroke   Acute ischemic right MCA stroke   1. Cerebral thrombosis and infarction - likely right MCA territory stroke 1. Dense right MCA territory findings on exam (see physical exam above) 2. Cannot MRI due to presence of nerve stimulator 3. Stroke pathway otherwise 4. NPO due to failed swallow study in ED 5. 2d echo 6. ASA for now, consider anticoagulation if non-ambulatory after this given A.Fib 7. Carotid duplex 8. Tele monitor 9. PT/OT 10. SLP eval tomorrow 2. HTN - add PRN if needed, unable to continue PO meds due to NPO status 3. Chronic back pain - convert to IV narcotics if needed or fentanyl patch 4. Hypothyroidism  - ordering IV synthroid  Dr. Amada JupiterKirkpatrick has been consulted and his note is in the chart.  Code Status: Full Code  Family Communication: Family at bedside Disposition Plan: Admit to inpatient  Time spent: 70 min  GARDNER, JARED M. [facility] Pager [phone]  If 7AM-7PM, please contact the day team taking care of the patient [email or web address] Password [email or web address] 03/25/2015, 8:00 PM

## 2015-03-25 NOTE — ED Provider Notes (Signed)
CSN: [id number]     Arrival date & time 03/25/15  1844 History   First MD Initiated Contact with Patient 03/25/15 1847     Chief complaint: Code stroke  (Consider location/radiation/quality/duration/timing/severity/associated sxs/prior Treatment) The history is provided by the EMS personnel. The history is limited by the condition of the patient (Altered mental status).   81 year old female is brought in by ambulance as a code stroke. Family had last spoken with her at 4:30 in which time she was normal. She is noted to have gaze preference to the right. Past history includes paroxysmal atrial fibrillation without anticoagulation because of frequent falls. Also significant is past history of dementia. Patient is not able to give any history.  Past Medical History  Diagnosis Date  . GERD (gastroesophageal reflux disease)   . Depression   . Neuropathy   . Constipation, chronic   . Insomnia   . Chronic pain   . Hypothyroid   . Anxiety   . Frequent falls     "not much in the last few months" (11/08/2013)  . PAF (paroxysmal atrial fibrillation)     not on anticoagulation due to a fall risk, wore an Event monitor 02//23/11-03/03/11  . PAT (paroxysmal atrial tachycardia)     Supraventricular tachycardia; most recent episode associated with sepsis  . Hypertension   . AAA (abdominal aortic aneurysm) without rupture     Followed by routine Dopplers; ~4.9 cm diameter by CT abdomen February 2015  . Spinal stenosis   . Complication of anesthesia     "it's hard for me to wakeup" (11/08/2013)  . Pneumonia     "several years ago" (11/08/2013)  . Chronic bronchitis     "hasn't had it in quite a few years" (11/08/2013)  . Shortness of breath     "can come about at anytime" (11/08/2013)  . Sleep apnea     "doesn't wear a mask; RX'd but wouldn't get one" (11/08/2013)  . Headache(784.0)     "at least weekly; sometimes daily" (11/08/2013)  . Stroke ?1980's    denies residual on 11/08/2013  . DDD  (degenerative disc disease)     "all of her spine" (11/08/2013)  . Arthritis     "joints; back; hands are getting pretty bad" (11/08/2013)  . Chronic lower back pain   . Chronic leg pain     "nerve pain" (11/08/2013)  . Dementia     "confusion comes and goes; mostly situational; does not have good short term memory but won't admit it" (11/08/2013)   Past Surgical History  Procedure Laterality Date  . Nm myocar perf wall motion  June 2014    No ischemia or infarction, mild apical breast attenuation, EF roughly 70%  . Doppler echocardiography  03/2013; 04/2014    Normal EF 60-65%. Mild/moderate aortic regurgitation, mildly dilated RV with moderate pulmonary hypertension; b. EF 60-65%, Gr 1 DD, Mod Ao Sclerosis - no stenosis, Mild AI, normal PAP  . Abdomnal aorta  02/25/2012    mid abd. aorta  mid segment diltaton 3.93 x 4.55 cm greatest diameter.mild amt atherosclerosis without evidence of sigificant diamter reduction  . Lower arterial extremities doppler  02/19/2011    right abi 0.98,left 0.97  . Renal doppler  02/12/2011    abd aorta prox 2.6 x 3.1 cm,mid 4.1 x 3.6 cm, distal 2.3 x 3.4 cm, right renal artery 1-59% left renal artery norm. ,both renal size normal  . Renal doppler  02/11/2010    infrarenal AAA mearsuring 3.6 x  3.7 cm  . Appendectomy    . Cholecystectomy    . Eye surgery    . Lumbar disc surgery  X 3  . Tonsillectomy    . Knee arthroscopy Right ?1980's  . Vaginal hysterectomy    . Dilation and curettage of uterus      "a few" (11/08/2013)  . Cataract extraction w/ intraocular lens  implant, bilateral Bilateral 2000's  . Anterior cervical decomp/discectomy fusion     Family History  Problem Relation Age of Onset  . Stroke Mother   . Heart attack Father   . Lung cancer Sister   . Bone cancer Sister   . High blood pressure    . Heart disease     History  Substance Use Topics  . Smoking status: Former Smoker -- 0.50 packs/day for 10 years    Types: Cigarettes  .  Smokeless tobacco: Never Used  . Alcohol Use: 0.0 oz/week    0 Standard drinks or equivalent per week     Comment: 11/08/2013 "used to drink some; never had problem w/it; haven't had a drink in years"   OB History    No data available     Review of Systems  Unable to perform ROS: Mental status change      Allergies  Clarithromycin; Codeine; Tequin; and Penicillins  Home Medications   Prior to Admission medications   Medication Sig Start Date End Date Taking? Authorizing Provider  acetaminophen (TYLENOL) 500 MG tablet Take 1,000 mg by mouth every 6 (six) hours as needed for mild pain.    Historical Provider, MD  atorvastatin (LIPITOR) 10 MG tablet Take 1 tablet (10 mg total) by mouth daily at 6 PM. 11/10/13   Esperanza SheetsUlugbek N Buriev, MD  Biotin 5000 MCG CAPS Take 5,000 mcg by mouth daily.    Historical Provider, MD  bisacodyl (DULCOLAX) 10 MG suppository Place 10 mg rectally daily as needed for mild constipation or moderate constipation.    Historical Provider, MD  clopidogrel (PLAVIX) 75 MG tablet Take 1 tablet (75 mg total) by mouth daily. 06/28/14   Kela MillinAdeline C Viyuoh, MD  docusate sodium (COLACE) 50 MG capsule Take 50 mg by mouth 2 (two) times daily.     Historical Provider, MD  donepezil (ARICEPT) 5 MG tablet Take 5 mg by mouth at bedtime.    Historical Provider, MD  doxycycline (VIBRAMYCIN) 100 MG capsule Take 100 mg by mouth every evening.  09/04/14   Historical Provider, MD  fish oil-omega-3 fatty acids 1000 MG capsule Take 2 g by mouth every morning.     Historical Provider, MD  fluticasone (FLONASE) 50 MCG/ACT nasal spray Place 1 spray into both nostrils daily.    Historical Provider, MD  furosemide (LASIX) 20 MG tablet TAKE 1-2 TABLETS DAILY AS DIRECTED. 09/19/14   Marykay Lexavid W Harding, MD  furosemide (LASIX) 20 MG tablet TAKE 1-2 TABLETS DAILY AS DIRECTED. 10/30/14   Marykay Lexavid W Harding, MD  gabapentin (NEURONTIN) 400 MG capsule Take 1 capsule (400 mg total) by mouth at bedtime. 06/28/14    Kela MillinAdeline C Viyuoh, MD  levothyroxine (SYNTHROID, LEVOTHROID) 75 MCG tablet Take 1 tablet (75 mcg total) by mouth daily before breakfast. 04/06/13   Joseph ArtJessica U Vann, DO  lidocaine (LIDODERM) 5 % Place 1 patch onto the skin daily. As needed for pain (on back)    Historical Provider, MD  loratadine (CLARITIN) 10 MG tablet Take 10 mg by mouth every evening.    Historical Provider, MD  Melatonin 5  MG TABS Take 10 mg by mouth at bedtime.    Historical Provider, MD  metoprolol succinate (TOPROL-XL) 50 MG 24 hr tablet Take 25 mg by mouth 2 (two) times daily. Take with or immediately following a meal. 04/06/13   Joseph Art, DO  mirabegron ER (MYRBETRIQ) 50 MG TB24 Take 50 mg by mouth every evening.     Historical Provider, MD  mirtazapine (REMERON) 15 MG tablet Take 15 mg by mouth every evening.    Historical Provider, MD  Multiple Vitamins-Minerals (ONE-A-DAY 50 PLUS PO) Take 1 tablet by mouth daily.    Historical Provider, MD  nitrofurantoin (MACRODANTIN) 100 MG capsule Take 100 mg by mouth every evening.  10/03/14   Historical Provider, MD  oxyCODONE (OXYCONTIN) 10 MG 12 hr tablet Take 10 mg by mouth 2 (two) times daily.     Historical Provider, MD  oxyCODONE-acetaminophen (PERCOCET/ROXICET) 5-325 MG per tablet Take 1 tablet by mouth 2 (two) times daily as needed for severe pain.    Historical Provider, MD  Polyethyl Glycol-Propyl Glycol (SYSTANE OP) Place 1 drop into both eyes as directed. Three to four times a day. With a compress two times a day    Historical Provider, MD  potassium chloride (K-DUR) 10 MEQ tablet Take 10 mEq by mouth every other day.  09/18/14   Historical Provider, MD  venlafaxine XR (EFFEXOR-XR) 75 MG 24 hr capsule Take 75 mg by mouth 2 (two) times daily.    Historical Provider, MD  Vitamin D, Ergocalciferol, (DRISDOL) 50000 UNITS CAPS Take 50,000 Units by mouth every 7 (seven) days. wednesdays    Historical Provider, MD  zaleplon (SONATA) 5 MG capsule Take 5 mg by mouth at bedtime.     Historical Provider, MD   BP 127/80 mmHg  Pulse 82  Temp(Src) 97.7 F (36.5 C) (Oral)  Resp 21  Wt 143 lb (64.864 kg)  SpO2 97% Physical Exam  Nursing note and vitals reviewed.  82 year old female, resting comfortably and in no acute distress. Vital signs are significant for borderline tachypnea. Oxygen saturation is 97%, which is normal. Head is normocephalic and atraumatic. PERRLA. Gaze is to the right and eyes do not cross the midline. Oropharynx is clear. Neck is nontender and supple without adenopathy or JVD. No carotid bruits. Back is nontender and there is no CVA tenderness. Lungs are clear without rales, wheezes, or rhonchi. Chest is nontender. Heart has regular rate and rhythm without murmur. Abdomen is soft, flat, nontender without masses or hepatosplenomegaly and peristalsis is normoactive. Extremities have no cyanosis or edema, full range of motion is present. Skin is warm and dry without rash. Neurologic: She is awake and will answer simple questions, cranial nerves are grossly intact, there is moderate left hemi-para cyst. Left Babinski response is present with none on the right.  ED Course  Procedures (including critical care time) Labs Review Results for orders placed or performed during the hospital encounter of 03/25/15  Ethanol  Result Value Ref Range   Alcohol, Ethyl (B) <5 0 - 9 mg/dL  Protime-INR  Result Value Ref Range   Prothrombin Time 13.2 11.6 - 15.2 seconds   INR 0.99 0.00 - 1.49  APTT  Result Value Ref Range   aPTT 28 24 - 37 seconds  CBC  Result Value Ref Range   WBC 11.6 (H) 4.0 - 10.5 K/uL   RBC 4.41 3.87 - 5.11 MIL/uL   Hemoglobin 13.3 12.0 - 15.0 g/dL   HCT 16.1 09.6 -  46.0 %   MCV 86.8 78.0 - 100.0 fL   MCH 30.2 26.0 - 34.0 pg   MCHC 34.7 30.0 - 36.0 g/dL   RDW 40.9 81.1 - 91.4 %   Platelets 279 150 - 400 K/uL  Differential  Result Value Ref Range   Neutrophils Relative % 72 43 - 77 %   Neutro Abs 8.4 (H) 1.7 - 7.7 K/uL    Lymphocytes Relative 17 12 - 46 %   Lymphs Abs 1.9 0.7 - 4.0 K/uL   Monocytes Relative 8 3 - 12 %   Monocytes Absolute 1.0 0.1 - 1.0 K/uL   Eosinophils Relative 2 0 - 5 %   Eosinophils Absolute 0.2 0.0 - 0.7 K/uL   Basophils Relative 1 0 - 1 %   Basophils Absolute 0.1 0.0 - 0.1 K/uL  Comprehensive metabolic panel  Result Value Ref Range   Sodium 136 135 - 145 mmol/L   Potassium 4.0 3.5 - 5.1 mmol/L   Chloride 102 96 - 112 mmol/L   CO2 20 19 - 32 mmol/L   Glucose, Bld 112 (H) 70 - 99 mg/dL   BUN 17 6 - 23 mg/dL   Creatinine, Ser 7.82 (H) 0.50 - 1.10 mg/dL   Calcium 9.2 8.4 - 95.6 mg/dL   Total Protein 6.5 6.0 - 8.3 g/dL   Albumin 3.7 3.5 - 5.2 g/dL   AST 19 0 - 37 U/L   ALT 14 0 - 35 U/L   Alkaline Phosphatase 76 39 - 117 U/L   Total Bilirubin 0.7 0.3 - 1.2 mg/dL   GFR calc non Af Amer 32 (L) >90 mL/min   GFR calc Af Amer 38 (L) >90 mL/min   Anion gap 14 5 - 15  I-Stat Chem 8, ED  Result Value Ref Range   Sodium 136 135 - 145 mmol/L   Potassium 3.9 3.5 - 5.1 mmol/L   Chloride 102 96 - 112 mmol/L   BUN 21 6 - 23 mg/dL   Creatinine, Ser 2.13 (H) 0.50 - 1.10 mg/dL   Glucose, Bld 086 (H) 70 - 99 mg/dL   Calcium, Ion 5.78 (L) 1.13 - 1.30 mmol/L   TCO2 19 0 - 100 mmol/L   Hemoglobin 14.3 12.0 - 15.0 g/dL   HCT 46.9 62.9 - 52.8 %  I-Stat Troponin, ED (not at [facility])  Result Value Ref Range   Troponin i, poc 0.01 0.00 - 0.08 ng/mL   Comment 3           Imaging Review Ct Head Wo Contrast  03/25/2015   CLINICAL DATA:  Code stroke  EXAM: CT HEAD WITHOUT CONTRAST  TECHNIQUE: Contiguous axial images were obtained from the base of the skull through the vertex without intravenous contrast.  COMPARISON:  11/08/2014  FINDINGS: Global atrophy. Extensive chronic ischemic changes in the periventricular white matter and bilateral basal ganglia. No mass effect, midline shift, or acute intracranial hemorrhage. Mastoid air cells are clear  IMPRESSION: No acute intracranial pathology.    Electronically Signed   By: Jolaine Click M.D.   On: 03/25/2015 19:13     EKG Interpretation   Date/Time:  Monday March 25 2015 19:03:23 EDT Ventricular Rate:  85 PR Interval:  186 QRS Duration: 81 QT Interval:  400 QTC Calculation: 476 R Axis:   -24 Text Interpretation:  Sinus rhythm Inferior infarct, old Baseline wander  in lead(s) V6 When compared with ECG of 11/08/2014, No significant change  was found Confirmed by Preston Fleeting  MD, Ayaan Shutes (  [id number]) on 03/25/2015 8:04:10 PM      CRITICAL CARE Performed by: UEAVW,[email or web address] Total critical care time: 35 minutes Critical care time was exclusive of separately billable procedures and treating other patients. Critical care was necessary to treat or prevent imminent or life-threatening deterioration. Critical care was time spent personally by me on the following activities: development of treatment plan with patient and/or surrogate as well as nursing, discussions with consultants, evaluation of patient's response to treatment, examination of patient, obtaining history from patient or surrogate, ordering and performing treatments and interventions, ordering and review of laboratory studies, ordering and review of radiographic studies, pulse oximetry and re-evaluation of patient's condition.\ MDM   Final diagnoses:  Stroke  Renal insufficiency    Right cerebral stroke in pattern suggesting middle cerebral artery. She is being sent for CT scan and of may be a candidate for thrombolytic therapy. Patient is seen in conjunction with Dr. Petra Kuba, neuro-hospitalist.  CT shows no evidence of hemorrhage. Apparently, patient has been actually living at home with caregiver 24/7. Baseline activity level is that she is able to use a walker but only with assistance. She actually has been having symptoms which have been going back for several weeks. Given her history of paroxysmal atrial fibrillation, there is a high probability of embolic stroke. She is not a  candidate for any interventions. Case is discussed with Dr. Julian Reil of [facility] who agrees to admit the patient.  Dione Booze, MD 03/25/15 2005

## 2015-03-25 NOTE — Consult Note (Signed)
Neurology Consultation Reason for Consult: Left sided weakness Referring Physician: Preston Fleeting, D  CC: Left sided weakness  History is obtained from:daughter  HPI: Chelsea Lynch is a 81 y.o. female with a history of mild dementia, previous strokes with left sided weakness who presents with an acute worsening today. Her daughter reports that her left side has been noticeably weaker for the last two weeks. She has needed increased help with walking and has not been using her arm as much. Today, she had an abrupt worsening with left sided facial droop, right gaze preference and slurred speech and therefore was brought to the emergency room.    LKW: 2 weeks PTA tpa given?: no, outside of window.   No significant disability despite symptoms: No. Able to carry out all usual activities/duties: No. Modified Rankin 0-1: No.    (yes if responses to above are both "yes")   ROS: A 14 point ROS was performed and is negative except as noted in the HPI.   Past Medical History  Diagnosis Date  . GERD (gastroesophageal reflux disease)   . Depression   . Neuropathy   . Constipation, chronic   . Insomnia   . Chronic pain   . Hypothyroid   . Anxiety   . Frequent falls     "not much in the last few months" (11/08/2013)  . PAF (paroxysmal atrial fibrillation)     not on anticoagulation due to a fall risk, wore an Event monitor 02//23/11-03/03/11  . PAT (paroxysmal atrial tachycardia)     Supraventricular tachycardia; most recent episode associated with sepsis  . Hypertension   . AAA (abdominal aortic aneurysm) without rupture     Followed by routine Dopplers; ~4.9 cm diameter by CT abdomen February 2015  . Spinal stenosis   . Complication of anesthesia     "it's hard for me to wakeup" (11/08/2013)  . Pneumonia     "several years ago" (11/08/2013)  . Chronic bronchitis     "hasn't had it in quite a few years" (11/08/2013)  . Shortness of breath     "can come about at anytime" (11/08/2013)  . Sleep  apnea     "doesn't wear a mask; RX'd but wouldn't get one" (11/08/2013)  . Headache(784.0)     "at least weekly; sometimes daily" (11/08/2013)  . Stroke ?1980's    denies residual on 11/08/2013  . DDD (degenerative disc disease)     "all of her spine" (11/08/2013)  . Arthritis     "joints; back; hands are getting pretty bad" (11/08/2013)  . Chronic lower back pain   . Chronic leg pain     "nerve pain" (11/08/2013)  . Dementia     "confusion comes and goes; mostly situational; does not have good short term memory but won't admit it" (11/08/2013)    Family History: Mother - stroke  Social History: Lives at   Exam: Current vital signs: BP 158/88 mmHg  Pulse 86  Temp(Src) 97.8 F (36.6 C) (Oral)  Resp 16  Wt 64.864 kg (143 lb)  SpO2 100% Vital signs in last 24 hours: Temp:  [97.8 F (36.6 C)] 97.8 F (36.6 C) (04/18 1923) Pulse Rate:  [86] 86 (04/18 1905) Resp:  [16] 16 (04/18 1905) BP: (158)/(88) 158/88 mmHg (04/18 1905) SpO2:  [95 %-100 %] 100 % (04/18 1905) Weight:  [64.864 kg (143 lb)] 64.864 kg (143 lb) (04/18 1905)  Physical Exam  Constitutional: Appears elderly Psych: Affect appropriate to situation Eyes: No scleral injection HENT:  No OP obstrucion Head: Normocephalic.  Cardiovascular: irregular Respiratory: Effort normal  GI: Soft.  No distension. There is no tenderness.  Skin: WDI  Neuro: Mental Status: Patient is awake, alert, oriented to person, place, but not month or year No signs of aphasia. + L hemineglect Cranial Nerves: II: Visual Fields are notable for left anopia. Pupils are equal, round, and reactive to light.   III,IV, VI: Right gaze preference, does not cross midline V: Facial sensation is decreased on left(no sensation) VII: Facial movement is notable for left droop VIII: hearing is intact to voice Motor: Tone is normal. Bulk is normal. 5/5 strength was present on the right, she has no voluntary movement in her left arm, but is able to lift  the left leg against gravity.  Sensory: Sensation is decreased on the left. When pinched on left arm, she registers it as being on her "tummy" Plantars: upgoing on left Cerebellar: FNF and HKS are intact on right   I have reviewed labs in epic and the results pertinent to this consultation are: Mild leukocytosis  I have reviewed the images obtained: CT head - no acute findings.   Impression: 81 yo F with likely significant infarct. Given her symptoms started two weeks aog with an abrupt worsening today, she is not a candidate for IV tPA. She will need to be admitted for workup and evaluation by PT,OT. She may well need placement secondary to this event.   Recommendations: 1. HgbA1c, fasting lipid panel 2. Repeat CT in 24 hours.  3. Frequent neuro checks 4. Echocardiogram 5. Carotid dopplers IF patient were felt to be a surgical candidate 6. Prophylactic therapy-Antiplatelet med: Aspirin - dose 325mg  PO or 300mg  PR for now, if non-ambulatory after this would consider anti-coagulation.  7. Risk factor modification 8. Telemetry monitoring 9. PT consult, OT consult, Speech consult    [name], MD [facility] [phone]  If 7pm- 7am, please page neurology on call as listed in AMION.

## 2015-03-25 NOTE — ED Notes (Signed)
Dr. Gardner at bedside 

## 2015-03-25 NOTE — ED Notes (Signed)
Per eMS: pt from home with lsw of 1630, home health aid states pt had acute onset of right sided facial droop and right sided gaze. Pt with hx of stroke with left sided deficits at baseline. Pt normally able to converse, EMS noted pt slurred speech and disoriented, en route pt became more oriented and speech "improved". nad noted at this time. Pt able to answer questions.

## 2015-03-25 NOTE — Progress Notes (Signed)
MEDICATION RELATED CONSULT NOTE - INITIAL   Pharmacy Consult for metoprolol and oxycontin Indication: PO to IV conversion  Allergies  Allergen Reactions  . Clarithromycin Other (See Comments)    Unknown reaction    . Codeine Nausea And Vomiting  . Tequin [Gatifloxacin] Other (See Comments)    Unknown reaction.  Tolerates Levaquin.  Marland Kitchen Penicillins Rash    Patient Measurements: Weight: 143 lb (64.864 kg) Adjusted Body Weight:   Vital Signs: Temp: 97.7 F (36.5 C) (04/18 1942) Temp Source: Oral (04/18 1923) BP: 153/97 mmHg (04/18 2030) Pulse Rate: 87 (04/18 2030) Intake/Output from previous day:   Intake/Output from this shift:    Labs:  Recent Labs  03/25/15 1848 03/25/15 1854  WBC 11.6*  --   HGB 13.3 14.3  HCT 38.3 42.0  PLT 279  --   APTT 28  --   CREATININE 1.40* 1.30*  ALBUMIN 3.7  --   PROT 6.5  --   AST 19  --   ALT 14  --   ALKPHOS 76  --   BILITOT 0.7  --    Estimated Creatinine Clearance: 25.8 mL/min (by C-G formula based on Cr of 1.3).   Microbiology: No results found for this or any previous visit (from the past 720 hour(s)).  Medical History: Past Medical History  Diagnosis Date  . GERD (gastroesophageal reflux disease)   . Depression   . Neuropathy   . Constipation, chronic   . Insomnia   . Chronic pain   . Hypothyroid   . Anxiety   . Frequent falls     "not much in the last few months" (11/08/2013)  . PAF (paroxysmal atrial fibrillation)     not on anticoagulation due to a fall risk, wore an Event monitor 02//23/11-03/03/11  . PAT (paroxysmal atrial tachycardia)     Supraventricular tachycardia; most recent episode associated with sepsis  . Hypertension   . AAA (abdominal aortic aneurysm) without rupture     Followed by routine Dopplers; ~4.9 cm diameter by CT abdomen February 2015  . Spinal stenosis   . Complication of anesthesia     "it's hard for me to wakeup" (11/08/2013)  . Pneumonia     "several years ago" (11/08/2013)  .  Chronic bronchitis     "hasn't had it in quite a few years" (11/08/2013)  . Shortness of breath     "can come about at anytime" (11/08/2013)  . Sleep apnea     "doesn't wear a mask; RX'd but wouldn't get one" (11/08/2013)  . Headache(784.0)     "at least weekly; sometimes daily" (11/08/2013)  . Stroke ?1980's    denies residual on 11/08/2013  . DDD (degenerative disc disease)     "all of her spine" (11/08/2013)  . Arthritis     "joints; back; hands are getting pretty bad" (11/08/2013)  . Chronic lower back pain   . Chronic leg pain     "nerve pain" (11/08/2013)  . Dementia     "confusion comes and goes; mostly situational; does not have good short term memory but won't admit it" (11/08/2013)    Medications:   (Not in a hospital admission) Scheduled:  .  stroke: mapping our early stages of recovery book   Does not apply Once  . aspirin  300 mg Rectal Daily   Or  . aspirin  325 mg Oral Daily  . heparin  5,000 Units Subcutaneous 3 times per day  . levothyroxine  37.5 mcg Intravenous Daily  .  metoprolol  5 mg Intravenous 4 times per day   Infusions:    Assessment: 81yo female taking Toprol XL 25mg  BID and Oxycontin 10mg  BID. Pharmacy is consulted to convert PO to IV dosages. Will convert to IV metoprolol and IV morphine, respectively.  Goal of Therapy:  Proper conversion of home meds  Plan:  Metoprolol 5mg  IV q6h Morphine 1mg  IV q3h Recommend PRN pain control  Pharmacy will sign off.  Arlean Hoppingorey M. Newman PiesBall, PharmD Clinical Pharmacist Pager [phone] 03/25/2015,8:48 PM

## 2015-03-25 NOTE — Progress Notes (Signed)
Patient arrived to 4N12 from ED. Patient complaining of headache. VSS. Telemetry applied. Will continue to monitor patient closely. Monia PouchShakenna Yemaya Barnier, RN

## 2015-03-25 NOTE — Code Documentation (Signed)
81 [facility]  On arrival she is alert - left neglect - left facial droop right gaze preference - left arm flacid - left leg with some movement - her caregiver reports that around 1630 she developed some slurred speech - her daughter now reports that the patient has had some increasing left side weakness over the past 2 weeks.  Dr. Amada JupiterKirkpatrick present - now LSW determined to be 2 weeks ago.  NIHSS 16.  No acute treatment.  Handoff to Micron TechnologyDanelle RN.

## 2015-03-26 ENCOUNTER — Inpatient Hospital Stay (HOSPITAL_COMMUNITY): Payer: Medicare Other

## 2015-03-26 DIAGNOSIS — I480 Paroxysmal atrial fibrillation: Secondary | ICD-10-CM

## 2015-03-26 DIAGNOSIS — M549 Dorsalgia, unspecified: Secondary | ICD-10-CM

## 2015-03-26 DIAGNOSIS — I6789 Other cerebrovascular disease: Secondary | ICD-10-CM

## 2015-03-26 DIAGNOSIS — G8929 Other chronic pain: Secondary | ICD-10-CM

## 2015-03-26 LAB — LIPID PANEL
Cholesterol: 165 mg/dL (ref 0–200)
HDL: 53 mg/dL (ref >39)
LDL CALC: 94 mg/dL (ref 0–99)
TRIGLYCERIDES: 90 mg/dL (ref <150)
Total CHOL/HDL Ratio: 3.1 RATIO
VLDL: 18 mg/dL (ref 0–40)

## 2015-03-26 MED ORDER — CHLORHEXIDINE GLUCONATE 0.12 % MT SOLN
15.0000 mL | Freq: Two times a day (BID) | OROMUCOSAL | Status: DC
  Administered 2015-03-26 – 2015-03-28 (×4): 15 mL via OROMUCOSAL
  Filled 2015-03-26 (×2): qty 15

## 2015-03-26 MED ORDER — MORPHINE SULFATE (CONCENTRATE) 10 MG/0.5ML PO SOLN
5.0000 mg | ORAL | Status: DC | PRN
  Administered 2015-03-27 – 2015-03-28 (×3): 5 mg via ORAL
  Filled 2015-03-26 (×3): qty 0.5

## 2015-03-26 MED ORDER — ACETAMINOPHEN 650 MG RE SUPP
650.0000 mg | Freq: Four times a day (QID) | RECTAL | Status: DC | PRN
  Administered 2015-03-26: 650 mg via RECTAL
  Filled 2015-03-26 (×2): qty 1

## 2015-03-26 MED ORDER — CETYLPYRIDINIUM CHLORIDE 0.05 % MT LIQD
7.0000 mL | Freq: Two times a day (BID) | OROMUCOSAL | Status: DC
  Administered 2015-03-26 – 2015-03-27 (×2): 7 mL via OROMUCOSAL

## 2015-03-26 MED ORDER — FLUTICASONE PROPIONATE 50 MCG/ACT NA SUSP
1.0000 | Freq: Every day | NASAL | Status: DC
  Filled 2015-03-26: qty 16

## 2015-03-26 MED ORDER — SODIUM CHLORIDE 0.9 % IV SOLN
INTRAVENOUS | Status: DC
  Administered 2015-03-26 (×2): via INTRAVENOUS

## 2015-03-26 MED ORDER — LIDOCAINE 5 % EX PTCH
1.0000 | MEDICATED_PATCH | CUTANEOUS | Status: DC
  Administered 2015-03-26: 1 via TRANSDERMAL
  Filled 2015-03-26 (×2): qty 1

## 2015-03-26 NOTE — Evaluation (Signed)
Physical Therapy Evaluation Patient Details Name: Chelsea Lynch MRN: [id number] DOB: 01-23-1925 Today's Date: 03/26/2015   History of Present Illness  Pt presents with s/s of CVA, including L sided hemiparesis and neglect.  PMH: mild dementia, prior L weakness due to CVA   Clinical Impression  Pt is s/p hospitalization for s/s of CVA, resulting in the deficits listed below (see PT Problem List). Pt has baseline dementia and 24 hour caregiver at home.  PTA, pt was min assist for ambulation with RW and had assist for all ADL's (cooking, cleaning, bathing), but had LUE flexor synergy from prior CVA.  Pt demonstrated LUE/LLE hemiparesis with decreased pain sensation and L sided neglect.  Pt was max assist x2 for bed mobility and transfers.  Recommend d/c to CIR, to improve functional mobility to assist x1, due to the fact that pt has 24 hour caregiver support at home. Pt will benefit from skilled PT to increase their independence and safety with mobility to allow discharge to the venue listed below.     Follow Up Recommendations CIR    Equipment Recommendations  None recommended by PT    Recommendations for Other Services Rehab consult     Precautions / Restrictions Precautions Precautions: Fall Restrictions Weight Bearing Restrictions: No Other Position/Activity Restrictions: L sided flacidity      Mobility  Bed Mobility Overal bed mobility: +2 for physical assistance             General bed mobility comments: Pt needs max assist to get to sitting, used chuck pad negotiation and sequential torso management; ideally requires two people  Transfers Overall transfer level: Needs assistance Equipment used:  (2 person lift with gait belt and pad) Transfers: Stand Pivot Transfers;Sit to/from Stand Sit to Stand: Max assist;+2 physical assistance Stand pivot transfers: Max assist;+2 physical assistance       General transfer comment: Needed max assist 2x; use of 2 person chuck pad  lift with 1 person blocking LLE to prevent knee buckling,pt did attempt to initiate transfer by completing anterior trunk flexion  Ambulation/Gait             General Gait Details: Unable   Stairs            Wheelchair Mobility    Modified Rankin (Stroke Patients Only) Modified Rankin (Stroke Patients Only) Pre-Morbid Rankin Score: Moderately severe disability Modified Rankin: Severe disability     Balance Overall balance assessment: Needs assistance;History of Falls Sitting-balance support: Feet supported;No upper extremity supported (Mod/max external assist, sit to L of patient) Sitting balance-Leahy Scale: Zero Sitting balance - Comments: Pt able to tolerate sitting only with max assist on left Postural control: Left lateral lean   Standing balance-Leahy Scale: Zero Standing balance comment: Unable to tolerate standing without max assist x2                             Pertinent Vitals/Pain Pain Assessment: No/denies pain Faces Pain Scale: Hurts little more Pain Location: Unable to obtain    Home Living Family/patient expects to be discharged to:: Private residence Living Arrangements: Alone Available Help at Discharge: Personal care attendant;Available 24 hours/day Type of Home: House         Home Equipment: Krystal Clark - 4 wheels;Bedside commode;Shower seat;Hand held shower head Additional Comments: Pt had 24 hour caregivers PTA; they were able to help with all daily tasks, she used a RW and needed min assist with  ambulation, had help with showering, bathing and dressing    Prior Function Level of Independence: Needs assistance   Gait / Transfers Assistance Needed: Had min assist and use of RW  ADL's / Homemaking Assistance Needed: Total assist from caregiver  Comments: Caregiver was present, pt needed min assist with ambulation due to unsteadiness even with RW     Hand Dominance        Extremity/Trunk Assessment   Upper  Extremity Assessment: LUE deficits/detail       LUE Deficits / Details: LUE hemiparetic with flexor synergy and decreased pain perception, according to caregiver was present PTA, they had used hand towels to decrease wrist and finger flexion, RW was modified due to inability to hold on with L hand   Lower Extremity Assessment: LLE deficits/detail   LLE Deficits / Details: LLE hemiparetic with slight flexor synergy and decrease pain perception  Cervical / Trunk Assessment: Kyphotic (Extremely kyphotic with forward head posture)  Communication   Communication: Expressive difficulties (And L neglect)  Cognition Arousal/Alertness: Lethargic (pt easily aroused but difficult to maintain eye opening) Behavior During Therapy: Flat affect Overall Cognitive Status: History of cognitive impairments - at baseline Area of Impairment: Following commands     Memory:  (History of dementia at baseline) Following Commands: Follows one step commands inconsistently       General Comments: able to follow command with R UE/LE but not Left. Pt with left sided neglect    General Comments      Exercises        Assessment/Plan    PT Assessment Patient needs continued PT services  PT Diagnosis Difficulty walking;Abnormality of gait;Generalized weakness;Hemiplegia non-dominant side   PT Problem List Decreased strength;Decreased range of motion;Decreased activity tolerance;Decreased balance;Decreased mobility;Decreased coordination;Decreased cognition;Decreased knowledge of precautions;Decreased safety awareness  PT Treatment Interventions Gait training;Functional mobility training;Therapeutic activities;Therapeutic exercise;Balance training;Neuromuscular re-education;Cognitive remediation;Patient/family education   PT Goals (Current goals can be found in the Care Plan section) Acute Rehab PT Goals Patient Stated Goal: None stated PT Goal Formulation: Patient unable to participate in goal setting  (Caregiver present to participate in goal setting) Time For Goal Achievement: 04/09/15 Potential to Achieve Goals: Fair    Frequency Min 4X/week   Barriers to discharge Decreased caregiver support unclear if hired caregivers would be able to provide 2 person assist    Co-evaluation               End of Session Equipment Utilized During Treatment: Gait belt Activity Tolerance: Patient tolerated treatment well (Pt limited by weakness) Patient left: in chair;with call bell/phone within reach;with nursing/sitter in room Nurse Communication: Mobility status;Precautions         Time: [phone] PT Time Calculation (min) (ACUTE ONLY): 45 min   Charges:   PT Evaluation $Initial PT Evaluation Tier I: 1 Procedure PT Treatments $Therapeutic Activity: 23-37 mins   PT G Codes:        Arul Farabee 03/26/2015, 11:26 AM  Vella RaringKailee Jillianna Stanek, SPT (student physical therapist) Office phone: [phone]

## 2015-03-26 NOTE — Progress Notes (Signed)
STROKE TEAM PROGRESS NOTE   HISTORY ILIANI VEJAR is a 81 y.o. female with a history of mild dementia, previous strokes with left sided weakness who presents with an acute worsening today, 03/25/15. Her daughter reports that her left side has been noticeably weaker for the last two weeks (LKW 2 weeks ago, time unknown). She has needed increased help with walking and has not been using her arm as much. Today, she had an abrupt worsening with left sided facial droop, right gaze preference and slurred speech and therefore was brought to the emergency room. Patient was not administered TPA secondary to delay in arrival. She was admitted for further evaluation and treatment.   SUBJECTIVE (INTERVAL HISTORY) Her family is at the bedside.  Overall she feels her condition is gradually worsening. They are open to considering Hospice.   OBJECTIVE Temp:  [97.7 F (36.5 C)-98.7 F (37.1 C)] 98.2 F (36.8 C) (04/19 0717) Pulse Rate:  [69-95] 83 (04/19 0717) Cardiac Rhythm:  [-] Normal sinus rhythm;Sinus tachycardia;Heart block (04/18 2135) Resp:  [16-25] 22 (04/19 0717) BP: (127-158)/(80-110) 156/92 mmHg (04/19 0717) SpO2:  [95 %-100 %] 95 % (04/19 0717) Weight:  [64.864 kg (143 lb)] 64.864 kg (143 lb) (04/18 1905)  No results for input(s): GLUCAP in the last 168 hours.  Recent Labs Lab 03/25/15 1848 03/25/15 1854  NA 136 136  K 4.0 3.9  CL 102 102  CO2 20  --   GLUCOSE 112* 113*  BUN 17 21  CREATININE 1.40* 1.30*  CALCIUM 9.2  --     Recent Labs Lab 03/25/15 1848  AST 19  ALT 14  ALKPHOS 76  BILITOT 0.7  PROT 6.5  ALBUMIN 3.7    Recent Labs Lab 03/25/15 1848 03/25/15 1854  WBC 11.6*  --   NEUTROABS 8.4*  --   HGB 13.3 14.3  HCT 38.3 42.0  MCV 86.8  --   PLT 279  --    No results for input(s): CKTOTAL, CKMB, CKMBINDEX, TROPONINI in the last 168 hours.  Recent Labs  03/25/15 1848  LABPROT 13.2  INR 0.99   No results for input(s): COLORURINE, LABSPEC, PHURINE,  GLUCOSEU, HGBUR, BILIRUBINUR, KETONESUR, PROTEINUR, UROBILINOGEN, NITRITE, LEUKOCYTESUR in the last 72 hours.  Invalid input(s): APPERANCEUR     Component Value Date/Time   CHOL 165 03/26/2015 0716   TRIG 90 03/26/2015 0716   HDL 53 03/26/2015 0716   CHOLHDL 3.1 03/26/2015 0716   VLDL 18 03/26/2015 0716   LDLCALC 94 03/26/2015 0716   Lab Results  Component Value Date   HGBA1C 5.7* 04/20/2014      Component Value Date/Time   LABOPIA NONE DETECTED 03/25/2013 2254   COCAINSCRNUR NONE DETECTED 03/25/2013 2254   LABBENZ NONE DETECTED 03/25/2013 2254   AMPHETMU NONE DETECTED 03/25/2013 2254   THCU NONE DETECTED 03/25/2013 2254   LABBARB NONE DETECTED 03/25/2013 2254     Recent Labs Lab 03/25/15 1848  ETH <5    Ct Head Wo Contrast  03/26/2015   CLINICAL DATA:  Confusion, left-sided weakness.  EXAM: CT HEAD WITHOUT CONTRAST  TECHNIQUE: Contiguous axial images were obtained from the base of the skull through the vertex without intravenous contrast.  COMPARISON:  CT scan of March 25, 2015.  FINDINGS: Bony calvarium appears intact. Mild diffuse cortical atrophy is noted. There is been interval development of sulcal effacement and large low density involving the right MCA territory consistent with acute infarction. No midline shift is noted. Ventricular size is within normal  limits. There is no evidence of hemorrhage or mass lesion. Mild chronic ischemic white matter disease again noted.  IMPRESSION: Interval development of large right acute MCA infarction. These results will be called to the ordering clinician or representative by the Radiologist Assistant, and communication documented in the PACS or zVision Dashboard.   Electronically Signed   By: Lupita RaiderJames  Green Jr, M.D.   On: 03/26/2015 13:41   Ct Head Wo Contrast  03/25/2015   CLINICAL DATA:  Code stroke  EXAM: CT HEAD WITHOUT CONTRAST  TECHNIQUE: Contiguous axial images were obtained from the base of the skull through the vertex without  intravenous contrast.  COMPARISON:  11/08/2014  FINDINGS: Global atrophy. Extensive chronic ischemic changes in the periventricular white matter and bilateral basal ganglia. No mass effect, midline shift, or acute intracranial hemorrhage. Mastoid air cells are clear  IMPRESSION: No acute intracranial pathology.   Electronically Signed   By: Jolaine ClickArthur  Hoss M.D.   On: 03/25/2015 19:13   2D Echocardiogram  EF 65-70%. Normal biventricular size and function. Abnormal relaxation, normal filling pressures.Normal RVSP. Trivial AI and TR.   PHYSICAL EXAM Frail elderly caucasian lady not in distress. . Afebrile. Head is nontraumatic. Neck is supple without bruit.    Cardiac exam no murmur or gallop. Lungs are clear to auscultation. Distal pulses are well felt. Neurological Exam : Awake alert. The right neck and gaze deviation. Unable to look to the left. Dysarthric speech. Follows only simple midline commands. Left hemi-neglect. Pupils equal reactive. Fundi could not be visualized. Blinks to threat on the right but not on the left. Left face weakness. Tongue deviates to the left. Left hemiparesis with grade 1/2 strength with increased tone and non-fixed contractures of the left wrist and fingers. Purposeful antigravity movements on the right. Decreased sensation left hemibody. Left plantar upgoing right downgoing. ASSESSMENT/PLAN Ms. Arlington CalixMaxine J Ehrler is a 81 y.o. female with history of mild dementia, previous strokes with left sided weakness presenting with worsening L sided weakness x 2 weeks. She did not receive IV t-PA due to delay in arrival.   Stroke:  Non-Dominant right MCA infarct embolic secondary to known atrial fibrillation not on anticoagulation d/t fall risk  Resultant  Increased left hemiparesis, dysarthria, dysphagia,   MRI  / MRA  Not ordered  repeat CT head shows large R MCA ischemic infarct  Carotid Doppler  Carotid Doppler  There is 1-39% bilateral ICA stenosis. Vertebral artery flow is  antegrade.    2D Echo  No source of embolus   HgbA1c pending  Heparin 5000 units sq tid for VTE prophylaxis Diet NPO time specified  clopidogrel 75 mg orally every day prior to admission, now on aspirin 300 mg suppository daily  Ongoing aggressive stroke risk factor management  Therapy recommendations:  pending   Disposition:  pending  Patient with large stroke with long-term disability expected. Will need feeding tube and skilled nursing facility placement if survives. Family had already been thinking about Hospice services prior to admission. Dr. Pearlean BrownieSethi discussed diagnosis and prognosis with family. They are considering palliative care. They want to talk about it and see how she does over the next day or so.   Atrial Fibrillation  Not on anticoagulation secondary to fall risk  Hypertension  Stable  Hyperlipidemia  Home meds:  Omega 3 & lipitor 10 mg daily, not resumed due to NPO status  LDL 94, goal < 70  Continue statin at discharge, based on plan of care  Other Stroke Risk Factors  Advanced age  Former Cigarette smoker, quit smoking   ETOH use  Hx stroke/TIA - old R brain infarct w/ resultant L hemiparesis  Family hx stroke (mother)  Obstructive sleep apnea  Other Active Problems  Baseline dementia on aricept  Chronic back pain  Hypothyroidism  Hospital day # 1  BIBY,SHARON  [facility] See Amion for Pager information 03/26/2015 10:57 AM   I have personally examined this patient, reviewed notes, independently viewed imaging studies, participated in medical decision making and plan of care. I have made any additions or clarifications directly to the above note. Agree with note above.  She has unfortunately had what appears to be a large right brain infarct with significant left hemiplegia, left hemi-neglect, right gaze deviation and left visual field loss. Her prognosis is quite poor. She is at significant risk for neurological worsening  and recurrent strokes and TIAs. I had a long discussion with her 2 daughters regarding her prognosis and they agreed to DO NOT RESUSCITATE and likely leaning towards palliative care only. Continue present care to the family makes a decision about hospice care  Delia Heady, MD Medical Director [facility] Pager: [phone] 03/26/2015 2:00 PM   To contact Stroke Continuity provider, please refer to [email or web address]. After hours, contact General Neurology

## 2015-03-26 NOTE — Progress Notes (Signed)
  Echocardiogram 2D Echocardiogram has been performed.  Chelsea Lynch, Chelsea Lynch 03/26/2015, 10:43 AM

## 2015-03-26 NOTE — Evaluation (Signed)
Clinical/Bedside Swallow Evaluation Patient Details  Name: Chelsea Lynch MRN: [id number] Date of Birth: January 31, 1925  Today's Date: 03/26/2015 Time: SLP Start Time (ACUTE ONLY): 0854 SLP Stop Time (ACUTE ONLY): 0913 SLP Time Calculation (min) (ACUTE ONLY): 19 min  Past Medical History:  Past Medical History  Diagnosis Date  . GERD (gastroesophageal reflux disease)   . Depression   . Neuropathy   . Constipation, chronic   . Insomnia   . Chronic pain   . Hypothyroid   . Anxiety   . Frequent falls     "not much in the last few months" (11/08/2013)  . PAF (paroxysmal atrial fibrillation)     not on anticoagulation due to a fall risk, wore an Event monitor 02//23/11-03/03/11  . PAT (paroxysmal atrial tachycardia)     Supraventricular tachycardia; most recent episode associated with sepsis  . Hypertension   . AAA (abdominal aortic aneurysm) without rupture     Followed by routine Dopplers; ~4.9 cm diameter by CT abdomen February 2015  . Spinal stenosis   . Complication of anesthesia     "it's hard for me to wakeup" (11/08/2013)  . Pneumonia     "several years ago" (11/08/2013)  . Chronic bronchitis     "hasn't had it in quite a few years" (11/08/2013)  . Shortness of breath     "can come about at anytime" (11/08/2013)  . Sleep apnea     "doesn't wear a mask; RX'd but wouldn't get one" (11/08/2013)  . Headache(784.0)     "at least weekly; sometimes daily" (11/08/2013)  . Stroke ?1980's    denies residual on 11/08/2013  . DDD (degenerative disc disease)     "all of her spine" (11/08/2013)  . Arthritis     "joints; back; hands are getting pretty bad" (11/08/2013)  . Chronic lower back pain   . Chronic leg pain     "nerve pain" (11/08/2013)  . Dementia     "confusion comes and goes; mostly situational; does not have good short term memory but won't admit it" (11/08/2013)   Past Surgical History:  Past Surgical History  Procedure Laterality Date  . Nm myocar perf wall motion  June 2014     No ischemia or infarction, mild apical breast attenuation, EF roughly 70%  . Doppler echocardiography  03/2013; 04/2014    Normal EF 60-65%. Mild/moderate aortic regurgitation, mildly dilated RV with moderate pulmonary hypertension; b. EF 60-65%, Gr 1 DD, Mod Ao Sclerosis - no stenosis, Mild AI, normal PAP  . Abdomnal aorta  02/25/2012    mid abd. aorta  mid segment diltaton 3.93 x 4.55 cm greatest diameter.mild amt atherosclerosis without evidence of sigificant diamter reduction  . Lower arterial extremities doppler  02/19/2011    right abi 0.98,left 0.97  . Renal doppler  02/12/2011    abd aorta prox 2.6 x 3.1 cm,mid 4.1 x 3.6 cm, distal 2.3 x 3.4 cm, right renal artery 1-59% left renal artery norm. ,both renal size normal  . Renal doppler  02/11/2010    infrarenal AAA mearsuring 3.6 x 3.7 cm  . Appendectomy    . Cholecystectomy    . Eye surgery    . Lumbar disc surgery  X 3  . Tonsillectomy    . Knee arthroscopy Right ?1980's  . Vaginal hysterectomy    . Dilation and curettage of uterus      "a few" (11/08/2013)  . Cataract extraction w/ intraocular lens  implant, bilateral Bilateral 2000's  . Anterior  cervical decomp/discectomy fusion     HPI:  81 yo female adm to [facility] with left arm weakness x2 weeks, onset of left droop/right gaze preference and dysphagia day of admit.  PMH + for pna, dysphagia *esoph dysmotility dx 1999, bronchitis, previous smoker.  Pt being worked up for CVA, CT head negative 4/19.  Swallow/speech evaluation ordered.    Assessment / Plan / Recommendation Clinical Impression  Pt currently demonstrates clinical indications of severe oropharyngeal dysphagia - obvious CN deficits impacting left noted.  Weak cough noted with wet vocal quality concerning for vagus nerve involvement and poor airway protection.  SLP provided pt with only moisture via toothette and 1/4 tsp of nectar thick juice.  Excessively delayed swallow followed by immediate throat clearing and weak  cough.  Recommend pt remain NPO with adequate oral care/moisture.  SLP to follow up next date for po readiness/SLE.  Obvious left neglect and dysarthria noted.  Educated pt/caregiver Aurea Graff to findings/recommendations and reviewed need for pt to work on "dry swallow" with moisture and strengthening cough.  Provided text to daughter per Aurea Graff request (verbally) with findings and recommendations that [facility] sent.      Aspiration Risk  Severe    Diet Recommendation NPO (oral care, moisture)   Medication Administration: Via alternative means    Other  Recommendations Oral Care Recommendations: Oral care Q4 per protocol Other Recommendations: Have oral suction available oral suction  Follow Up Recommendations  Inpatient Rehab    Frequency and Duration min 2x/week  2 weeks   Pertinent Vitals/Pain Afebrile, decreased      Swallow Study Prior Functional Status  Type of Home: House Available Help at Discharge: Personal care attendant;Available 24 hours/day    General Date of Onset: 03/26/15 HPI: 81 yo female adm to [facility] with left arm weakness x2 weeks, onset of left droop/right gaze preference and dysphagia day of admit.  PMH + for pna, dysphagia *esoph dysmotility dx 1999, bronchitis, previous smoker.  Pt being worked up for CVA, CT head negative 4/19.  Swallow/speech evaluation ordered.  Type of Study: Bedside swallow evaluation Diet Prior to this Study: NPO Temperature Spikes Noted: No Respiratory Status: Room air History of Recent Intubation: No Behavior/Cognition: Lethargic;Distractible;Decreased sustained attention;Requires cueing;Cooperative Oral Cavity - Dentition: Adequate natural dentition Self-Feeding Abilities: Total assist (at this time, pt is total assist) Patient Positioning: Other (comment) (pt is kyphotic, slp repositioned pt) Baseline Vocal Quality: Low vocal intensity Volitional Cough: Weak Volitional Swallow: Unable to elicit    Oral/Motor/Sensory Function Overall Oral  Motor/Sensory Function: Impaired Labial ROM: Reduced left Labial Symmetry: Abnormal symmetry left Labial Strength: Reduced Labial Sensation: Reduced Lingual ROM: Reduced left Lingual Symmetry: Abnormal symmetry left Lingual Strength: Reduced Lingual Sensation: Reduced Facial ROM: Reduced left Facial Symmetry: Left droop Facial Strength: Reduced Mandible:  (dnt)   Ice Chips Ice chips: Not tested   Thin Liquid Thin Liquid: Impaired Presentation:  (via toothette) Oral Phase Impairments: Reduced labial seal;Impaired anterior to posterior transit Oral Phase Functional Implications: Prolonged oral transit Pharyngeal  Phase Impairments: Suspected delayed Swallow;Decreased hyoid-laryngeal movement;Throat Clearing - Immediate;Cough - Immediate Other Comments: moisture via toothette provided only    Nectar Thick Nectar Thick Liquid: Impaired Presentation: Spoon (1/4 tsp only) Oral Phase Impairments: Reduced labial seal;Reduced lingual movement/coordination;Impaired anterior to posterior transit Oral phase functional implications: Prolonged oral transit Pharyngeal Phase Impairments: Suspected delayed Swallow;Decreased hyoid-laryngeal movement;Cough - Delayed;Throat Clearing - Immediate   Honey Thick Honey Thick Liquid: Not tested   Puree Puree: Not tested   Solid  GO    Solid: Not tested       Donavan Burnetamara Leevon Upperman, MS [facility] SLP [phone]

## 2015-03-26 NOTE — Progress Notes (Signed)
*  PRELIMINARY RESULTS* Vascular Ultrasound Carotid Duplex (Doppler) has been completed.  Findings suggest 1-39% left internal carotid artery stenosis. Unable to insonate flow in the distal right internal carotid artery, suggestive of possible occlusion. Vertebral arteries are patent with antegrade flow.  03/26/2015 3:14 PM Gertie FeyMichelle Sorayah Schrodt, RVT, RDCS, RDMS

## 2015-03-26 NOTE — Progress Notes (Addendum)
Inpatient Rehabilitation  Patient was screened by Weldon PickingSusan Alethia Melendrez for appropriateness for an Inpatient Acute Rehab consult.  At this time, we are recommending Inpatient Rehab consult.  Please order when you feel appropriate.   Weldon PickingSusan Magdalene Tardiff PT Inpatient Rehab Admissions Coordinator Cell [phone] Office [phone]   Addendum 1249:  I received a call from Dr. Jerral RalphGhimire that pt. Is not appropriate for IP Rehab due to her current medical status.   Weldon PickingSusan Gail Vendetti

## 2015-03-26 NOTE — Progress Notes (Addendum)
PATIENT DETAILS Name: Chelsea Lynch Age: 81 y.o. Sex: female Date of Birth: 22-May-1925 Admit Date: 03/25/2015 Admitting Physician Hillary Bow, DO ZOX:[id number],VWUJWJ DAVIDSON, MD  Subjective: Confused this morning. Continues to have left-sided weakness.  Assessment/Plan: Principal Problem:   Cerebral thrombosis with cerebral infarction: Admitted with worsening left-sided weakness. CT head negative for acute abnormalities, unable to perform a MRI as patient has a spinal cord stimulator in place. Does have a history of paroxysmal atrial fibrillation, review of prior outpatient/inpatient notes indicates that after discussion with patient and family, decision has been made not to anticoagulate given high risk of falls/frailty. This M.D. had a long discussion with patient's daughter and healthcare power of attorney-Mrs. White [phone]), we discussed issues with anticoagulation, poor swallowing function and other issues. Mrs. White realizes that patient continues to be a poor candidate for long-term anticoagulation, once/if she is able to regain swallowing function, we will place her back on antiplatelet agents and not on anticoagulation. She also realizes that if patient is unable to safely swallow, we will not be recommending a PEG tube-our recommendation would be for hospice care. Family is understandable and agreeable. We also discussed about CODE STATUS, family is agreeable for a DO NOT RESUSCITATE as well. Continue aspirin, repeat CT head 24 hours from admission and await further recommendations from stroke team. For now, we will continue to provide supportive care, repeat swallow evaluation will be done in the next few days to see if patient can be placed on a diet. Await therapeutic recommendations. Disposition is currently unclear, we will see how she does over the next few days before deciding on an appropriate disposition.  Active Problems:   Dysphagia: Secondary to  above. See above discussion, if no improvement-Will require hospice care.     History of paroxysmal atrial fibrillation: Chads2vasc score 7. Not a anticoagulation candidate-See above. Currently in sinus rhythm.    Chronic kidney disease stage III: Creatinine closely usual baseline. Monitor periodically.    History of AAA: Reviewed outpatient vascular surgery notes, not a candidate for repair.    Hypertension: Continue with IV metoprolol-we will be careful to allow some mild permissive hypertension.    Hypothyroidism: Continue IV levothyroxine-switch to oral levothyroxine when oral intake is resumed    Chronic back pain: On chronic narcotics, will see if patient can tolerate some sublingual morphine. She will need some narcotics to prevent withdrawal  Disposition: Remain inpatient  Antimicrobial agents  See below  Anti-infectives    None      DVT Prophylaxis: Prophylactic Heparin   Code Status:  DNR  Family Communication Daughter/HPOA:Suzanne White-[phone].  Procedures: None  CONSULTS:  neurology  Time spent 40 minutes-which includes 50% of the time with face-to-face with patient/ family and coordinating care related to the above assessment and plan.  MEDICATIONS: Scheduled Meds: . aspirin  300 mg Rectal Daily   Or  . aspirin  325 mg Oral Daily  . heparin  5,000 Units Subcutaneous 3 times per day  . levothyroxine  37.5 mcg Intravenous QAC breakfast  . metoprolol  5 mg Intravenous 4 times per day  .  morphine injection  1 mg Intravenous Q3H   Continuous Infusions:  PRN Meds:.    PHYSICAL EXAM: Vital signs in last 24 hours: Filed Vitals:   03/26/15 0135 03/26/15 0359 03/26/15 0544 03/26/15 0717  BP: 146/102 145/110 149/109 156/92  Pulse: 69 90 95 83  Temp:  98.2 F (36.8 C) 98.7 F (37.1 C) 97.9 F (36.6 C) 98.2 F (36.8 C)  TempSrc: Oral Oral Oral Oral  Resp: 18 18 18 22   Weight:      SpO2: 99% 98% 98% 95%    Weight change:  Filed  Weights   03/25/15 1905  Weight: 64.864 kg (143 lb)   Body mass index is 24.53 kg/(m^2).   Gen Exam: Awake, some dysarthria-but confused  Neck: Supple, No JVD.   Chest: B/L Clear.  CVS: S1 S2 Regular, no murmurs.  Abdomen: soft, BS +, non tender, non distended.  Extremities: no edema, lower extremities warm to touch. Neurologic: Difficult exam given confusion-observed to be spontaneously moving her right upper and right lower extremity, no voluntary/spontaneous movement in her left side. At times seems to move her left lower extremity. Skin: No Rash.   Wounds: N/A.    Intake/Output from previous day: No intake or output data in the 24 hours ending 03/26/15 1019   LAB RESULTS: CBC  Recent Labs Lab 03/25/15 1848 03/25/15 1854  WBC 11.6*  --   HGB 13.3 14.3  HCT 38.3 42.0  PLT 279  --   MCV 86.8  --   MCH 30.2  --   MCHC 34.7  --   RDW 14.1  --   LYMPHSABS 1.9  --   MONOABS 1.0  --   EOSABS 0.2  --   BASOSABS 0.1  --     Chemistries   Recent Labs Lab 03/25/15 1848 03/25/15 1854  NA 136 136  K 4.0 3.9  CL 102 102  CO2 20  --   GLUCOSE 112* 113*  BUN 17 21  CREATININE 1.40* 1.30*  CALCIUM 9.2  --     CBG: No results for input(s): GLUCAP in the last 168 hours.  GFR Estimated Creatinine Clearance: 25.8 mL/min (by C-G formula based on Cr of 1.3).  Coagulation profile  Recent Labs Lab 03/25/15 1848  INR 0.99    Cardiac Enzymes No results for input(s): CKMB, TROPONINI, MYOGLOBIN in the last 168 hours.  Invalid input(s): CK  Invalid input(s): POCBNP No results for input(s): DDIMER in the last 72 hours. No results for input(s): HGBA1C in the last 72 hours.  Recent Labs  03/26/15 0716  CHOL 165  HDL 53  LDLCALC 94  TRIG 90  CHOLHDL 3.1   No results for input(s): TSH, T4TOTAL, T3FREE, THYROIDAB in the last 72 hours.  Invalid input(s): FREET3 No results for input(s): VITAMINB12, FOLATE, FERRITIN, TIBC, IRON, RETICCTPCT in the last 72  hours. No results for input(s): LIPASE, AMYLASE in the last 72 hours.  Urine Studies No results for input(s): UHGB, CRYS in the last 72 hours.  Invalid input(s): UACOL, UAPR, USPG, UPH, UTP, UGL, UKET, UBIL, UNIT, UROB, ULEU, UEPI, UWBC, URBC, UBAC, CAST, UCOM, BILUA  MICROBIOLOGY: No results found for this or any previous visit (from the past 240 hour(s)).  RADIOLOGY STUDIES/RESULTS: Ct Abdomen Pelvis Wo Contrast  03/14/2015   CLINICAL DATA:  Abdominal aortic aneurysm for 1 year  EXAM: CT ABDOMEN AND PELVIS WITHOUT CONTRAST  TECHNIQUE: Multidetector CT imaging of the abdomen and pelvis was performed following the standard protocol without IV contrast.  COMPARISON:  CT abdomen and pelvis January 08, 2014  FINDINGS: Intra and extrahepatic biliary ductal dilatation are stable. The patient is status post prior cholecystectomy. The liver is unremarkable. The spleen, pancreas, adrenal glands and kidneys are normal. There is no hydronephrosis bilaterally. Right parapelvic renal cyst is  noted. There is no small bowel obstruction or diverticulitis. Patient is status post prior appendectomy. There is no abdominal lymphadenopathy.  Extensive aortoiliac atherosclerosis is identified unchanged. There is fusiform aneurysm of the abdominal aorta measuring maximum 5.5 cm AP in the mid to distal abdominal aorta on image 42. This is enlarged compared to prior CT where previously measured 4.9 cm AP. There is dilatation of the right common iliac artery measuring 2 cm AP unchanged.  Partial fluid-filled bladder is unremarkable. Patient status post prior hysterectomy. There is fluid in the vaginal canal.  Mild atelectasis versus fibrosis/ scar is identified in the bilateral lung bases. Stable posterior right lung base nodule is unchanged. Chronic compression deformity of L1 is unchanged. Mild superior endplate compression deformity of L2 is identified new since February 2015 but probably chronic. Degenerative joint changes  of the spine are noted.  IMPRESSION: Fusiform aneurysm of the abdominal aorta measuring maximum 5.5 cm AP in the mid to distal abdominal aorta enlarged compared to prior CT of February 2015 where it previously measured 4.9 cm AP.  No acute abnormality identified in the abdomen and pelvis.   Electronically Signed   By: Sherian Rein M.D.   On: 03/14/2015 15:49   Ct Head Wo Contrast  03/25/2015   CLINICAL DATA:  Code stroke  EXAM: CT HEAD WITHOUT CONTRAST  TECHNIQUE: Contiguous axial images were obtained from the base of the skull through the vertex without intravenous contrast.  COMPARISON:  11/08/2014  FINDINGS: Global atrophy. Extensive chronic ischemic changes in the periventricular white matter and bilateral basal ganglia. No mass effect, midline shift, or acute intracranial hemorrhage. Mastoid air cells are clear  IMPRESSION: No acute intracranial pathology.   Electronically Signed   By: Jolaine Click M.D.   On: 03/25/2015 19:13    Jeoffrey Massed, MD  [facility] Pager:[phone]  If 7PM-7AM, please contact night-coverage [email or web address] Password [email or web address] 03/26/2015, 10:19 AM   LOS: 1 day

## 2015-03-26 NOTE — Progress Notes (Signed)
OT Cancellation Note  Patient Details Name: Chelsea Lynch MRN: [id number] DOB: Sep 19, 1925   Cancelled Treatment:    Reason Eval/Treat Not Completed: Patient at procedure or test/ unavailable. Pt having CT scan performed. OT to reattempt as schedule permits.  Pilar GrammesMathews, Tressia Labrum H 03/26/2015, 1:02 PM

## 2015-03-27 DIAGNOSIS — I714 Abdominal aortic aneurysm, without rupture: Secondary | ICD-10-CM

## 2015-03-27 DIAGNOSIS — I10 Essential (primary) hypertension: Secondary | ICD-10-CM

## 2015-03-27 LAB — HEMOGLOBIN A1C
HEMOGLOBIN A1C: 5.9 % — AB (ref 4.8–5.6)
MEAN PLASMA GLUCOSE: 123 mg/dL

## 2015-03-27 MED ORDER — MORPHINE SULFATE (CONCENTRATE) 10 MG/0.5ML PO SOLN: 5.0000 mg | ORAL | Status: AC | PRN

## 2015-03-27 MED ORDER — LIDOCAINE 5 % EX PTCH: 1.0000 | MEDICATED_PATCH | CUTANEOUS | Status: AC

## 2015-03-27 MED ORDER — LORAZEPAM 2 MG/ML PO CONC
1.0000 mg | ORAL | Status: DC | PRN
  Administered 2015-03-27 – 2015-03-28 (×2): 1 mg via ORAL
  Filled 2015-03-27 (×2): qty 1

## 2015-03-27 MED ORDER — LORAZEPAM 2 MG/ML PO CONC: 1.0000 mg | ORAL | Status: AC | PRN

## 2015-03-27 NOTE — Progress Notes (Signed)
OT Cancellation Note  Patient Details Name: Arlington CalixMaxine J Brotz MRN: [id number] DOB: [birth year]   Cancelled Treatment:    Reason Eval/Treat Not Completed: Other (comment) (Pt comfort care status now. Family plans to d/c home with hospice)  Pilar GrammesMathews, Nyeemah Jennette H 03/27/2015, 10:43 AM

## 2015-03-27 NOTE — Progress Notes (Signed)
CARE MANAGEMENT NOTE 03/27/2015  Patient:  Chelsea Lynch,Chelsea Lynch   Account Number:  [id number]  Date Initiated:  03/27/2015  Documentation initiated by:  Elmer BalesOBARGE,Teresa Lemmerman  Subjective/Objective Assessment:   Patient was admitted with CVA. Lives at home with a 24/7 caregiver.     Action/Plan:   Will follow for discharge needs pending PT/OT evals and physician orders.   Anticipated DC Date:     Anticipated DC Plan:  HOME W HOME HEALTH SERVICES         Choice offered to / List presented to:             Status of service:  In process, will continue to follow Medicare Important Message given?   (If response is "NO", the following Medicare IM given date fields will be blank) Date Medicare IM given:   Medicare IM given by:   Date Additional Medicare IM given:   Additional Medicare IM given by:    Discharge Disposition:    Per UR Regulation:  Reviewed for med. necessity/level of care/duration of stay  If discussed at Long Length of Stay Meetings, dates discussed:    Comments:

## 2015-03-27 NOTE — Progress Notes (Signed)
PT Cancellation Note  Patient Details Name: Arlington CalixMaxine J Keesey MRN: [id number] DOB: 1925-08-18   Cancelled Treatment:      Reason Eval/Treat Not Completed: Other (comment) (Pt comfort care status now. Family plans to d/c home with hospice)   Fabio AsaWerner, Sahand Gosch J 03/27/2015, 10:51 AM Charlotte Crumbevon Deeanne Deininger, PT DPT  [phone]

## 2015-03-27 NOTE — Progress Notes (Signed)
CARE MANAGEMENT NOTE 03/27/2015  Patient:  KYLII, ENNIS   Account Number:  [id number]  Date Initiated:  03/27/2015  Documentation initiated by:  Lorne Skeens  Subjective/Objective Assessment:   Patient was admitted with CVA. Lives at home with a 24/7 caregiver.     Action/Plan:   Will follow for discharge needs pending PT/OT evals and physician orders.   Anticipated DC Date:  03/27/2015   Anticipated DC Plan:  [place]  CM consult      Choice offered to / List presented to:  C-4 Adult Children           HH agency  [facility] [place]   Status of service:  In process, will continue to follow Medicare Important Message given?   (If response is "NO", the following Medicare IM given date fields will be blank) Date Medicare IM given:   Medicare IM given by:   Date Additional Medicare IM given:   Additional Medicare IM given by:    Discharge Disposition:    Per UR Regulation:  Reviewed for med. necessity/level of care/duration of stay  If discussed at [place] of Stay Meetings, dates discussed:    Comments:  03/27/15 [place], MSN, CM- Met with patient's daughter Philis Fendt (cell# [phone]) regarding home hospice.  Daughter is agreeable and has chosen [facility].  CM spoke with Juliann Pulse at [facility] to give the referral.  Per Juliann Pulse, referral will be given to [facility], who will be contacting family.  Patient currently has a hospital bed in place and has 24/7 caregivers in the home.  Patient's address was verified and is correct in the chart.

## 2015-03-27 NOTE — Progress Notes (Signed)
Patient was seen and examined, deteriorating, confused, apneic spells this am. Repeat CT head shows a large MCA infarct. After discussion with daughter at bedside-poor prognoses-we have decided to transition to comfort measures. Family wants to take patient home with home hospice.Have asked Case Management to offer hospice choices. Home later today.

## 2015-03-27 NOTE — Progress Notes (Addendum)
Speech Language Pathology Treatment: Dysphagia  Patient Details Name: Chelsea Lynch MRN: [id number] DOB: [birth year] Today's Date: 03/27/2015 Time: [id number] SLP Time Calculation (min) (ACUTE ONLY): 26 min  Assessment / Plan / Recommendation Clinical Impression  Pt seen to determine readiness for po intake - Upon entrance to room, pt alert and stating "help me" followed by significant periods of apnea *up to 30 seconds.  RN made aware of pt requests, concerns.  Per caregiver Tiffany, pt has been stating "help me" and having apnea episodes since 6 am.    Oral moisture provided via toothette which did not elicit a swallow.  Periods of apnea occurred in increased frequency with decreased time between episodes.    SLP to sign off per Md pt will be hospice appropriate.  Pt is not appropriate for po intake due to excessive aspiration risk from her apneic periods and lack of swallowing.  Educated caregiver of one year Garment/textile technologist(Tiffany) in room to recommendations for oral care/moisture only for pt's comfort.     HPI HPI: 81 [facility] weakness x2 weeks, onset of left droop/right gaze preference and dysphagia day of admit.  PMH + for pna, dysphagia *esoph dysmotility dx 1999, bronchitis, previous smoker.  Pt being worked up for CVA, CT head negative 4/19, imaging study showed large right MCA CVA.  Today pt seen to determine readiness for po intake and SLE.     Pertinent Vitals Pain Assessment: Faces Faces Pain Scale: Hurts little more Pain Location: stomach, feel sick Pain Intervention(s): Limited activity within patient's tolerance (RN made aware)  SLP Plan  Discharge SLP treatment due to (comment) (pt with progressing medical illness, per Md will plan for hospice plan)    Recommendations Diet recommendations: NPO (oral care for comfort) Medication Administration: Via alternative means              Oral Care Recommendations: Oral care Q4 per protocol Follow up Recommendations:  None Plan: Discharge SLP treatment due to (comment) (pt with progressing medical illness, per Md will plan for hospice plan)    GO     Mills KollerKimball, Dimitris Shanahan Ann Ryka Beighley, MS [facility] SLP [phone]

## 2015-03-27 NOTE — Discharge Summary (Addendum)
PATIENT DETAILS Name: Chelsea Lynch Age: 81 y.o. Sex: female Date of Birth: 05-22-1925 MRN: [id number]. Admitting Physician: Hillary Bow, DO WUJ:[id number],[id number] DAVIDSON, MD  Admit Date: 03/25/2015 Discharge date: 03/28/2015  Recommendations for Outpatient Follow-up:  Large right MCA infarct-with significant dysphagia-transition to comfort measures.  PRIMARY DISCHARGE DIAGNOSIS:  Principal Problem:   Cerebral thrombosis with cerebral infarction Active Problems:   Backache   Hypothyroidism   Stroke   Acute ischemic right MCA stroke      PAST MEDICAL HISTORY: Past Medical History  Diagnosis Date  . GERD (gastroesophageal reflux disease)   . Depression   . Neuropathy   . Constipation, chronic   . Insomnia   . Chronic pain   . Hypothyroid   . Anxiety   . Frequent falls     "not much in the last few months" (11/08/2013)  . PAF (paroxysmal atrial fibrillation)     not on anticoagulation due to a fall risk, wore an Event monitor 02//23/11-03/03/11  . PAT (paroxysmal atrial tachycardia)     Supraventricular tachycardia; most recent episode associated with sepsis  . Hypertension   . AAA (abdominal aortic aneurysm) without rupture     Followed by routine Dopplers; ~4.9 cm diameter by CT abdomen February 2015  . Spinal stenosis   . Complication of anesthesia     "it's hard for me to wakeup" (11/08/2013)  . Pneumonia     "several years ago" (11/08/2013)  . Chronic bronchitis     "hasn't had it in quite a few years" (11/08/2013)  . Shortness of breath     "can come about at anytime" (11/08/2013)  . Sleep apnea     "doesn't wear a mask; RX'd but wouldn't get one" (11/08/2013)  . Headache(784.0)     "at least weekly; sometimes daily" (11/08/2013)  . Stroke ?1980's    denies residual on 11/08/2013  . DDD (degenerative disc disease)     "all of her spine" (11/08/2013)  . Arthritis     "joints; back; hands are getting pretty bad" (11/08/2013)  . Chronic lower back pain   .  Chronic leg pain     "nerve pain" (11/08/2013)  . Dementia     "confusion comes and goes; mostly situational; does not have good short term memory but won't admit it" (11/08/2013)    DISCHARGE MEDICATIONS: Current Discharge Medication List    START taking these medications   Details  LORazepam (LORAZEPAM INTENSOL) 2 MG/ML concentrated solution Place 0.5 mLs (1 mg total) under the tongue every 4 (four) hours as needed for anxiety, sedation or sleep. Qty: 30 mL, Refills: 0    Morphine Sulfate (MORPHINE CONCENTRATE) 10 MG/0.5ML SOLN concentrated solution Take 0.25 mLs (5 mg total) by mouth every 4 (four) hours as needed for moderate pain, anxiety or shortness of breath. Qty: 30 mL, Refills: 0      CONTINUE these medications which have CHANGED   Details  lidocaine (LIDODERM) 5 % Place 1 patch onto the skin daily. As needed for pain (on back) Qty: 30 patch, Refills: 0      STOP taking these medications     acetaminophen (TYLENOL) 500 MG tablet      atorvastatin (LIPITOR) 10 MG tablet      Biotin 5000 MCG CAPS      clopidogrel (PLAVIX) 75 MG tablet      docusate sodium (COLACE) 50 MG capsule      donepezil (ARICEPT) 5 MG tablet      doxycycline (  VIBRAMYCIN) 100 MG capsule      fish oil-omega-3 fatty acids 1000 MG capsule      fluticasone (FLONASE) 50 MCG/ACT nasal spray      furosemide (LASIX) 20 MG tablet      gabapentin (NEURONTIN) 400 MG capsule      levothyroxine (SYNTHROID, LEVOTHROID) 75 MCG tablet      loratadine (CLARITIN) 10 MG tablet      Melatonin 5 MG TABS      metoprolol succinate (TOPROL-XL) 50 MG 24 hr tablet      mirabegron ER (MYRBETRIQ) 50 MG TB24      mirtazapine (REMERON) 15 MG tablet      Multiple Vitamins-Minerals (ONE-A-DAY 50 PLUS PO)      nitrofurantoin (MACRODANTIN) 100 MG capsule      oxyCODONE (OXYCONTIN) 10 MG 12 hr tablet      potassium chloride (K-DUR) 10 MEQ tablet      venlafaxine XR (EFFEXOR-XR) 75 MG 24 hr capsule       Vitamin D, Ergocalciferol, (DRISDOL) 50000 UNITS CAPS      zaleplon (SONATA) 5 MG capsule      oxyCODONE-acetaminophen (PERCOCET/ROXICET) 5-325 MG per tablet      Polyethyl Glycol-Propyl Glycol (SYSTANE OP)         ALLERGIES:   Allergies  Allergen Reactions  . Clarithromycin Other (See Comments)    Unknown reaction    . Codeine Nausea And Vomiting  . Tequin [Gatifloxacin] Other (See Comments)    Unknown reaction.  Tolerates Levaquin.  Marland Kitchen Penicillins Rash    BRIEF HPI:  See H&P, Labs, Consult and Test reports for all details in brief, patient is a 81 year old female with history of dementia, prior CVAs, atrial fibrillation on anticoagulation presented with significant worsening of her chronic left-sided weakness. Patient was then admitted for further evaluation and treatment  CONSULTATIONS:   neurology  PERTINENT RADIOLOGIC STUDIES: Ct Abdomen Pelvis Wo Contrast  03/14/2015   CLINICAL DATA:  Abdominal aortic aneurysm for 1 year  EXAM: CT ABDOMEN AND PELVIS WITHOUT CONTRAST  TECHNIQUE: Multidetector CT imaging of the abdomen and pelvis was performed following the standard protocol without IV contrast.  COMPARISON:  CT abdomen and pelvis January 08, 2014  FINDINGS: Intra and extrahepatic biliary ductal dilatation are stable. The patient is status post prior cholecystectomy. The liver is unremarkable. The spleen, pancreas, adrenal glands and kidneys are normal. There is no hydronephrosis bilaterally. Right parapelvic renal cyst is noted. There is no small bowel obstruction or diverticulitis. Patient is status post prior appendectomy. There is no abdominal lymphadenopathy.  Extensive aortoiliac atherosclerosis is identified unchanged. There is fusiform aneurysm of the abdominal aorta measuring maximum 5.5 cm AP in the mid to distal abdominal aorta on image 42. This is enlarged compared to prior CT where previously measured 4.9 cm AP. There is dilatation of the right common iliac artery  measuring 2 cm AP unchanged.  Partial fluid-filled bladder is unremarkable. Patient status post prior hysterectomy. There is fluid in the vaginal canal.  Mild atelectasis versus fibrosis/ scar is identified in the bilateral lung bases. Stable posterior right lung base nodule is unchanged. Chronic compression deformity of L1 is unchanged. Mild superior endplate compression deformity of L2 is identified new since February 2015 but probably chronic. Degenerative joint changes of the spine are noted.  IMPRESSION: Fusiform aneurysm of the abdominal aorta measuring maximum 5.5 cm AP in the mid to distal abdominal aorta enlarged compared to prior CT of February 2015 where it previously measured  4.9 cm AP.  No acute abnormality identified in the abdomen and pelvis.   Electronically Signed   By: Sherian Rein M.D.   On: 03/14/2015 15:49   Ct Head Wo Contrast  03/26/2015   CLINICAL DATA:  Confusion, left-sided weakness.  EXAM: CT HEAD WITHOUT CONTRAST  TECHNIQUE: Contiguous axial images were obtained from the base of the skull through the vertex without intravenous contrast.  COMPARISON:  CT scan of March 25, 2015.  FINDINGS: Bony calvarium appears intact. Mild diffuse cortical atrophy is noted. There is been interval development of sulcal effacement and large low density involving the right MCA territory consistent with acute infarction. No midline shift is noted. Ventricular size is within normal limits. There is no evidence of hemorrhage or mass lesion. Mild chronic ischemic white matter disease again noted.  IMPRESSION: Interval development of large right acute MCA infarction. These results will be called to the ordering clinician or representative by the Radiologist Assistant, and communication documented in the PACS or zVision Dashboard.   Electronically Signed   By: Lupita Raider, M.D.   On: 03/26/2015 13:41   Ct Head Wo Contrast  03/25/2015   CLINICAL DATA:  Code stroke  EXAM: CT HEAD WITHOUT CONTRAST   TECHNIQUE: Contiguous axial images were obtained from the base of the skull through the vertex without intravenous contrast.  COMPARISON:  11/08/2014  FINDINGS: Global atrophy. Extensive chronic ischemic changes in the periventricular white matter and bilateral basal ganglia. No mass effect, midline shift, or acute intracranial hemorrhage. Mastoid air cells are clear  IMPRESSION: No acute intracranial pathology.   Electronically Signed   By: Jolaine Click M.D.   On: 03/25/2015 19:13     PERTINENT LAB RESULTS: CBC:  Recent Labs  03/25/15 1848 03/25/15 1854  WBC 11.6*  --   HGB 13.3 14.3  HCT 38.3 42.0  PLT 279  --    CMET CMP     Component Value Date/Time   NA 136 03/25/2015 1854   K 3.9 03/25/2015 1854   CL 102 03/25/2015 1854   CO2 20 03/25/2015 1848   GLUCOSE 113* 03/25/2015 1854   BUN 21 03/25/2015 1854   CREATININE 1.30* 03/25/2015 1854   CALCIUM 9.2 03/25/2015 1848   PROT 6.5 03/25/2015 1848   ALBUMIN 3.7 03/25/2015 1848   AST 19 03/25/2015 1848   ALT 14 03/25/2015 1848   ALKPHOS 76 03/25/2015 1848   BILITOT 0.7 03/25/2015 1848   GFRNONAA 32* 03/25/2015 1848   GFRAA 38* 03/25/2015 1848    GFR Estimated Creatinine Clearance: 25.8 mL/min (by C-G formula based on Cr of 1.3). No results for input(s): LIPASE, AMYLASE in the last 72 hours. No results for input(s): CKTOTAL, CKMB, CKMBINDEX, TROPONINI in the last 72 hours. Invalid input(s): POCBNP No results for input(s): DDIMER in the last 72 hours.  Recent Labs  03/26/15 0716  HGBA1C 5.9*    Recent Labs  03/26/15 0716  CHOL 165  HDL 53  LDLCALC 94  TRIG 90  CHOLHDL 3.1   No results for input(s): TSH, T4TOTAL, T3FREE, THYROIDAB in the last 72 hours.  Invalid input(s): FREET3 No results for input(s): VITAMINB12, FOLATE, FERRITIN, TIBC, IRON, RETICCTPCT in the last 72 hours. Coags:  Recent Labs  03/25/15 1848  INR 0.99   Microbiology: No results found for this or any previous visit (from the past 240  hour(s)).   BRIEF HOSPITAL COURSE:   Principal Problem:  Acute ischemic right MCA stroke:Admitted with worsening left-sided weakness. Initial  CT head negative for acute abnormalities, unable to perform a MRI as patient has a spinal cord stimulator in place. Repeat CT of the head of the later showed a large right MCA territory infarct. Unfortunately, patient has developed significant dysphagia as a result of this large infarct, upon repeat evaluation by speech therapy she continues to exhibit significant oropharyngeal dysphagia and not able to protect airway. Difficult situation, this M.D. has had multiple discussion with patient's daughter Ms. Gray BernhardtSuzanne White over the phone and personally at bedside on 4/20. Family is aware of patient's poor overall prognosis, and after discussion with this M.D. and with neurology, family desires to transition to comfort care only. Family desires to take patient home with hospice. As a result all of patient's usual medications have been discontinued, I have asked case management to offer home hospice choices. Once home hospice has been set up, patient will be discharged home. We will provide Roxanol and Ativan as needed. In regards to the CVA, patient does have a history of paroxysmal atrial fibrillation, unfortunately she is not considered a candidate for anticoagulation because of increased fall risk and frailty. On admission she was started on aspirin rectally. However because of poor overall prognosis, we have discontinued all antiplatelet agents.  Active Problems: Dysphagia: Secondary to above. See above discussion, plans are to discharge with home hospice care. Okay for comfort feedings, family aware.  History of paroxysmal atrial fibrillation: Chads2vasc score 7. Not a anticoagulation candidate-See above  Chronic kidney disease stage III: Creatinine closely usual baseline. No further monitoring required.  History of AAA: Reviewed outpatient vascular surgery  notes, not a candidate for repair.  Hypertension: Since being transitioned to hospice care with, we have discontinued all antihypertensive medications Continue with IV metoprolol-we will be careful to allow some mild permissive hypertension.  Hypothyroidism: Since we have transitioned to comfort care, we have discontinued levothyroxine. Continue IV levothyroxine-switch to oral levothyroxine when oral intake is resumed  Chronic back pain: On chronic narcotics prior to this admission, we have switched her to sublingual Roxanol. Being discharged home with hospice, she will require titration of her pain regimen.  TODAY-DAY OF DISCHARGE:  Subjective:   Ines BloomerMaxine Spike today continues to deteriorate, she continues to have dense left-sided, unfortunately swallowing is not any better today.  Objective:   Blood pressure 164/78, pulse 100, temperature 98.8 F (37.1 C), temperature source Axillary, resp. rate 20, weight 64.864 kg (143 lb), SpO2 97 %. No intake or output data in the 24 hours ending 03/28/15 0934 Filed Weights   03/25/15 1905  Weight: 64.864 kg (143 lb)    Exam Awake, somewhat alert. Asking for water. Chest: Bilaterally clear CVS: S1-S2 regular Abdomen: Soft and nontender Neurology: Dense left-sided weakness  DISCHARGE CONDITION: Stable  DISPOSITION: Home with home Hospice  DISCHARGE INSTRUCTIONS:    Activity:  As tolerated   Diet recommendation: Comfort feeds  Discharge Instructions    Diet general    Complete by:  As directed   Comfort feeds     Increase activity slowly    Complete by:  As directed            Follow-up Information    Schedule an appointment as soon as possible for a visit with Londell MohPHARR,WALTER DAVIDSON, MD.   Specialty:  Internal Medicine   Why:  As needed   Contact information:   [address] [place] [id number] [phone]       Total Time spent on discharge equals 45  minutes.  SignedJeoffrey Massed 03/28/2015 9:34 AM

## 2015-03-27 NOTE — Progress Notes (Addendum)
Notified by Toni Amendourtney, [facility] HospitalCMRN of pt./family request [facility] after discharge. Chart and patient information reviewed with Dr. Berlinda Last. Mongu[place]od [facility] Medical Director. Scientist, research (medical).  Writer spoke with pt's daughter at the bedside to initiate education related to hospice philosophy, services and team approach to care. Pt's daughter voiced understanding of information provided. Per discussion plan is for discharge to home by PTAR on 03/28/15.  Please send signed completed DNR form home with pt./family. Pt. will need prescriptions for discharge comfort meds.  DME needs discussed and family declines any needs. Pt.has a hospital bed at home.  [facility] aware of the above. Please notify [facility] when pt. Is ready to leave the unit at discharge- [phone]. [facility] information and contact numbers have been given to pt's daughter during the visit.  Please call with any questions.  Sharen HeckLisa Strandberg RN [facility] Liaison [phone]

## 2015-03-27 NOTE — Progress Notes (Signed)
STROKE TEAM PROGRESS NOTE   HISTORY Chelsea Lynch is a 81 y.o. female with a history of mild dementia, previous strokes with left sided weakness who presents with an acute worsening today, 03/25/15. Her daughter reports that her left side has been noticeably weaker for the last two weeks (LKW 2 weeks ago, time unknown). She has needed increased help with walking and has not been using her arm as much. Today, she had an abrupt worsening with left sided facial droop, right gaze preference and slurred speech and therefore was brought to the emergency room. Patient was not administered TPA secondary to delay in arrival. She was admitted for further evaluation and treatment.   SUBJECTIVE (INTERVAL HISTORY) Her daughter is at the bedside.  Overall she feels her condition is gradually worsening. They have decided on Hospice. Repeat CT scan of the head yesterday shows a large right MCA infarct   OBJECTIVE Temp:  [97.5 F (36.4 C)-98.9 F (37.2 C)] 97.5 F (36.4 C) (04/20 0940) Pulse Rate:  [69-87] 83 (04/20 0940) Cardiac Rhythm:  [-] Normal sinus rhythm (04/20 0800) Resp:  [16-20] 18 (04/20 0940) BP: (134-176)/(82-97) 176/97 mmHg (04/20 0940) SpO2:  [93 %-99 %] 97 % (04/20 0940)  No results for input(s): GLUCAP in the last 168 hours.  Recent Labs Lab 03/25/15 1848 03/25/15 1854  NA 136 136  K 4.0 3.9  CL 102 102  CO2 20  --   GLUCOSE 112* 113*  BUN 17 21  CREATININE 1.40* 1.30*  CALCIUM 9.2  --     Recent Labs Lab 03/25/15 1848  AST 19  ALT 14  ALKPHOS 76  BILITOT 0.7  PROT 6.5  ALBUMIN 3.7    Recent Labs Lab 03/25/15 1848 03/25/15 1854  WBC 11.6*  --   NEUTROABS 8.4*  --   HGB 13.3 14.3  HCT 38.3 42.0  MCV 86.8  --   PLT 279  --    No results for input(s): CKTOTAL, CKMB, CKMBINDEX, TROPONINI in the last 168 hours.  Recent Labs  03/25/15 1848  LABPROT 13.2  INR 0.99   No results for input(s): COLORURINE, LABSPEC, PHURINE, GLUCOSEU, HGBUR, BILIRUBINUR,  KETONESUR, PROTEINUR, UROBILINOGEN, NITRITE, LEUKOCYTESUR in the last 72 hours.  Invalid input(s): APPERANCEUR     Component Value Date/Time   CHOL 165 03/26/2015 0716   TRIG 90 03/26/2015 0716   HDL 53 03/26/2015 0716   CHOLHDL 3.1 03/26/2015 0716   VLDL 18 03/26/2015 0716   LDLCALC 94 03/26/2015 0716   Lab Results  Component Value Date   HGBA1C 5.9* 03/26/2015      Component Value Date/Time   LABOPIA NONE DETECTED 03/25/2013 2254   COCAINSCRNUR NONE DETECTED 03/25/2013 2254   LABBENZ NONE DETECTED 03/25/2013 2254   AMPHETMU NONE DETECTED 03/25/2013 2254   THCU NONE DETECTED 03/25/2013 2254   LABBARB NONE DETECTED 03/25/2013 2254     Recent Labs Lab 03/25/15 1848  ETH <5    Ct Head Wo Contrast  03/26/2015   CLINICAL DATA:  Confusion, left-sided weakness.  EXAM: CT HEAD WITHOUT CONTRAST  TECHNIQUE: Contiguous axial images were obtained from the base of the skull through the vertex without intravenous contrast.  COMPARISON:  CT scan of March 25, 2015.  FINDINGS: Bony calvarium appears intact. Mild diffuse cortical atrophy is noted. There is been interval development of sulcal effacement and large low density involving the right MCA territory consistent with acute infarction. No midline shift is noted. Ventricular size is within normal limits. There  is no evidence of hemorrhage or mass lesion. Mild chronic ischemic white matter disease again noted.  IMPRESSION: Interval development of large right acute MCA infarction. These results will be called to the ordering clinician or representative by the Radiologist Assistant, and communication documented in the PACS or zVision Dashboard.   Electronically Signed   By: Lupita RaiderJames  Green Jr, M.D.   On: 03/26/2015 13:41   Ct Head Wo Contrast  03/25/2015   CLINICAL DATA:  Code stroke  EXAM: CT HEAD WITHOUT CONTRAST  TECHNIQUE: Contiguous axial images were obtained from the base of the skull through the vertex without intravenous contrast.   COMPARISON:  11/08/2014  FINDINGS: Global atrophy. Extensive chronic ischemic changes in the periventricular white matter and bilateral basal ganglia. No mass effect, midline shift, or acute intracranial hemorrhage. Mastoid air cells are clear  IMPRESSION: No acute intracranial pathology.   Electronically Signed   By: Jolaine ClickArthur  Hoss M.D.   On: 03/25/2015 19:13   2D Echocardiogram  EF 65-70%. Normal biventricular size and function. Abnormal relaxation, normal filling pressures.Normal RVSP. Trivial AI and TR.   PHYSICAL EXAM Frail elderly caucasian lady not in distress. . Afebrile. Head is nontraumatic. Neck is supple without bruit.    Cardiac exam no murmur or gallop. Lungs are clear to auscultation. Distal pulses are well felt. Neurological Exam : drowsy but can be aroused The right neck and gaze deviation. Unable to look to the left. Dysarthric speech. Follows only simple midline commands. Left hemi-neglect. Pupils equal reactive. Fundi could not be visualized. Blinks to threat on the right but not on the left. Left face weakness. Tongue deviates to the left. Left hemiparesis with grade 1/2 strength with increased tone and non-fixed contractures of the left wrist and fingers. Purposeful antigravity movements on the right. Decreased sensation left hemibody. Left plantar upgoing right downgoing. ASSESSMENT/PLAN Chelsea Lynch is a 81 y.o. female with history of mild dementia, previous strokes with left sided weakness presenting with worsening L sided weakness x 2 weeks. She did not receive IV t-PA due to delay in arrival.   Stroke:  Non-Dominant right MCA infarct embolic secondary to known atrial fibrillation not on anticoagulation d/t fall risk  Resultant  Increased left hemiparesis, dysarthria, dysphagia,   MRI  / MRA  Not ordered  repeat CT head shows large R MCA ischemic infarct  Carotid Doppler  Carotid Doppler  There is 1-39% bilateral ICA stenosis. Vertebral artery flow is antegrade.     2D Echo  No source of embolus   HgbA1c pending  Heparin 5000 units sq tid for VTE prophylaxis    clopidogrel 75 mg orally every day prior to admission, now on aspirin 300 mg suppository daily  Ongoing aggressive stroke risk factor management  Therapy recommendations:  pending   Disposition:  pending  Patient with large stroke with long-term disability expected. Will need feeding tube and skilled nursing facility placement if survives. Family had already been thinking about Hospice services prior to admission. Dr. Pearlean BrownieSethi discussed diagnosis and prognosis with family. They are considering palliative care. They want to talk about it and see how she does over the next day or so.   Atrial Fibrillation  Not on anticoagulation secondary to fall risk  Hypertension  Stable  Hyperlipidemia  Home meds:  Omega 3 & lipitor 10 mg daily, not resumed due to NPO status  LDL 94, goal < 70  Continue statin at discharge, based on plan of care  Other Stroke Risk Factors  Advanced age  Former Cigarette smoker, quit smoking   ETOH use  Hx stroke/TIA - old R brain infarct w/ resultant L hemiparesis  Family hx stroke (mother)  Obstructive sleep apnea  Other Active Problems  Baseline dementia on aricept  Chronic back pain  Hypothyroidism  Hospital day # 2        She has unfortunately had what appears to be a large right brain infarct with significant left hemiplegia, left hemi-neglect, right gaze deviation and left visual field loss. Her prognosis is quite poor. She is at significant risk for neurological worsening and recurrent strokes and TIAs. I had a long discussion with her 2 daughters regarding her prognosis and they agreed to DO NOT RESUSCITATE and   towards palliative care only.  Stroke service will sign off. Kindly call for questions. Discussed with patient, daughter and Dr. Jerral Ralph and answered questions Delia Heady, MD Medical Director [facility] Stroke  Center Pager: [phone] 03/27/2015 1:41 PM   To contact Stroke Continuity provider, please refer to [email or web address]. After hours, contact General Neurology

## 2015-03-27 NOTE — Progress Notes (Signed)
UR complete.  Christien Berthelot RN, MSN 

## 2015-03-28 NOTE — Progress Notes (Signed)
STROKE TEAM PROGRESS NOTE   HISTORY Chelsea Lynch is a 81 y.o. female with a history of mild dementia, previous strokes with left sided weakness who presents with an acute worsening today, 03/25/15. Her daughter reports that her left side has been noticeably weaker for the last two weeks (LKW 2 weeks ago, time unknown). She has needed increased help with walking and has not been using her arm as much. Today, she had an abrupt worsening with left sided facial droop, right gaze preference and slurred speech and therefore was brought to the emergency room. Patient was not administered TPA secondary to delay in arrival. She was admitted for further evaluation and treatment.   SUBJECTIVE (INTERVAL HISTORY) Her daughters are  at the bedside.  Overall she feels her condition is gradually worsening. They have decided on Hospice. They plan top take her home today with home hospice  OBJECTIVE Temp:  [97.5 F (36.4 C)-98.9 F (37.2 C)] 98.1 F (36.7 C) (04/21 1028) Pulse Rate:  [92-100] 92 (04/21 1028) Cardiac Rhythm:  [-]  Resp:  [16-20] 18 (04/21 1028) BP: (108-173)/(78-95) 160/95 mmHg (04/21 1028) SpO2:  [92 %-97 %] 93 % (04/21 1028)  No results for input(s): GLUCAP in the last 168 hours.  Recent Labs Lab 03/25/15 1848 03/25/15 1854  NA 136 136  K 4.0 3.9  CL 102 102  CO2 20  --   GLUCOSE 112* 113*  BUN 17 21  CREATININE 1.40* 1.30*  CALCIUM 9.2  --     Recent Labs Lab 03/25/15 1848  AST 19  ALT 14  ALKPHOS 76  BILITOT 0.7  PROT 6.5  ALBUMIN 3.7    Recent Labs Lab 03/25/15 1848 03/25/15 1854  WBC 11.6*  --   NEUTROABS 8.4*  --   HGB 13.3 14.3  HCT 38.3 42.0  MCV 86.8  --   PLT 279  --    No results for input(s): CKTOTAL, CKMB, CKMBINDEX, TROPONINI in the last 168 hours.  Recent Labs  03/25/15 1848  LABPROT 13.2  INR 0.99   No results for input(s): COLORURINE, LABSPEC, PHURINE, GLUCOSEU, HGBUR, BILIRUBINUR, KETONESUR, PROTEINUR, UROBILINOGEN, NITRITE,  LEUKOCYTESUR in the last 72 hours.  Invalid input(s): APPERANCEUR     Component Value Date/Time   CHOL 165 03/26/2015 0716   TRIG 90 03/26/2015 0716   HDL 53 03/26/2015 0716   CHOLHDL 3.1 03/26/2015 0716   VLDL 18 03/26/2015 0716   LDLCALC 94 03/26/2015 0716   Lab Results  Component Value Date   HGBA1C 5.9* 03/26/2015      Component Value Date/Time   LABOPIA NONE DETECTED 03/25/2013 2254   COCAINSCRNUR NONE DETECTED 03/25/2013 2254   LABBENZ NONE DETECTED 03/25/2013 2254   AMPHETMU NONE DETECTED 03/25/2013 2254   THCU NONE DETECTED 03/25/2013 2254   LABBARB NONE DETECTED 03/25/2013 2254     Recent Labs Lab 03/25/15 1848  ETH <5    Ct Head Wo Contrast  03/26/2015   CLINICAL DATA:  Confusion, left-sided weakness.  EXAM: CT HEAD WITHOUT CONTRAST  TECHNIQUE: Contiguous axial images were obtained from the base of the skull through the vertex without intravenous contrast.  COMPARISON:  CT scan of March 25, 2015.  FINDINGS: Bony calvarium appears intact. Mild diffuse cortical atrophy is noted. There is been interval development of sulcal effacement and large low density involving the right MCA territory consistent with acute infarction. No midline shift is noted. Ventricular size is within normal limits. There is no evidence of hemorrhage or mass  lesion. Mild chronic ischemic white matter disease again noted.  IMPRESSION: Interval development of large right acute MCA infarction. These results will be called to the ordering clinician or representative by the Radiologist Assistant, and communication documented in the PACS or zVision Dashboard.   Electronically Signed   By: Lupita Raider, M.D.   On: 03/26/2015 13:41   2D Echocardiogram  EF 65-70%. Normal biventricular size and function. Abnormal relaxation, normal filling pressures.Normal RVSP. Trivial AI and TR.   PHYSICAL EXAM Frail elderly caucasian lady not in distress. . Afebrile. Head is nontraumatic. Neck is supple without  bruit.    Cardiac exam no murmur or gallop. Lungs are clear to auscultation. Distal pulses are well felt. Neurological Exam : drowsy but can be aroused   right neck and gaze deviation. Unable to look to the left. Dysarthric speech. Follows only simple midline commands. Left hemi-neglect. Pupils equal reactive. Fundi could not be visualized. Blinks to threat on the right but not on the left. Left face weakness. Tongue deviates to the left. Left hemiparesis with grade 1/2 strength with increased tone and non-fixed contractures of the left wrist and fingers. Purposeful antigravity movements on the right. Decreased sensation left hemibody. Left plantar upgoing right downgoing. ASSESSMENT/PLAN Ms. Chelsea Lynch is a 81 y.o. female with history of mild dementia, previous strokes with left sided weakness presenting with worsening L sided weakness x 2 weeks. She did not receive IV t-PA due to delay in arrival.   Stroke:  Non-Dominant right MCA infarct embolic secondary to known atrial fibrillation not on anticoagulation d/t fall risk  Resultant  Increased left hemiparesis, dysarthria, dysphagia,   MRI  / MRA  Not ordered  repeat CT head shows large R MCA ischemic infarct  Carotid Doppler  Carotid Doppler  There is 1-39% bilateral ICA stenosis. Vertebral artery flow is antegrade.    2D Echo  No source of embolus   HgbA1c pending  Heparin 5000 units sq tid for VTE prophylaxis Diet general  clopidogrel 75 mg orally every day prior to admission, now on aspirin 300 mg suppository daily  Ongoing aggressive stroke risk factor management  Therapy recommendations:  pending   Disposition:  pending  Patient with large stroke with long-term disability expected. Will need feeding tube and skilled nursing facility placement if survives. Family had already been thinking about Hospice services prior to admission. Dr. Pearlean Brownie discussed diagnosis and prognosis with family. They are considering palliative care.  They want to talk about it and see how she does over the next day or so.   Atrial Fibrillation  Not on anticoagulation secondary to fall risk  Hypertension  Stable  Hyperlipidemia  Home meds:  Omega 3 & lipitor 10 mg daily, not resumed due to NPO status  LDL 94, goal < 70  Continue statin at discharge, based on plan of care  Other Stroke Risk Factors  Advanced age  Former Cigarette smoker, quit smoking   ETOH use  Hx stroke/TIA - old R brain infarct w/ resultant L hemiparesis  Family hx stroke (mother)  Obstructive sleep apnea  Other Active Problems  Baseline dementia on aricept  Chronic back pain  Hypothyroidism  Hospital day # 3        She has unfortunately had what appears to be a large right brain infarct with significant left hemiplegia, left hemi-neglect, right gaze deviation and left visual field loss. Her prognosis is quite poor.   I had a long discussion with her 2 daughters  regarding her prognosis and they agreed to DO NOT RESUSCITATE and    palliative care only.  Stroke service will sign off. Kindly call for questions. Discussed with patient, daughter and Dr. Jerral RalphGhimire and answered questions Delia HeadyPramod Nazly Digilio, MD Medical Director [facility] Pager: [phone] 03/28/2015 11:38 AM   To contact Stroke Continuity provider, please refer to [email or web address]. After hours, contact General Neurology

## 2015-03-28 NOTE — Progress Notes (Signed)
Hospice eligibility confirmed. CG, and both pt's daughters are at the bedside this morning. Patient is dozing on and off. Staff RN, Joni gave pt. A PO dose of Ativan 1 mg at 10 am for intermittent agitation. She rec'd one dose yesterday. In the last 24 hours she has received Morphine 1 mg IV and a total of 15 mg Morphine concentrate PO (10mg /0.5 ml). Patient's scripts are written and her DNR has been signed. Family is expecting [facility] for transfer this morning. [facility] plans to be at the home around 230- 3p, for admission. Family is aware.  Please call with any questions.   Sharen HeckLisa Strandberg RN [facility] Hospital Liaison [phone]

## 2015-03-28 NOTE — Progress Notes (Signed)
Patient seen and examined, lethargic/sleeping but comfortable. Caregiver at bedside. See discharge summary from yesterday. Stable for discharge with home hospice today.

## 2015-03-28 NOTE — Progress Notes (Signed)
Met with patient's daughters to review discharge plans. 3 rx given. Hospice and [facility] will provide follow up care in the home. Patient currently has a hospital bed in place and has 24/7 caregivers.  Pt was discharged via [facility] at [facility]

## 2015-04-07 DEATH — deceased

## 2015-04-11 ENCOUNTER — Ambulatory Visit: Payer: Medicare Other | Admitting: Cardiology

## 2017-01-12 IMAGING — CT CT HEAD W/O CM
1 of 2 series · 16 of 30 positions shown, 20 images · non-contrast
Comparison: CT scan of March 25, 2015.

CLINICAL DATA: Confusion, left-sided weakness.

EXAM:
CT HEAD WITHOUT CONTRAST
TECHNIQUE: Contiguous axial images were obtained from the base of the skull
through the vertex without intravenous contrast.

[Series 2: head 5.0 h30s · axial · 0.41mm/px · z∈[-131,+9]mm · 16 of 32 slices shown, 20 images]
[im 2/32  brain]
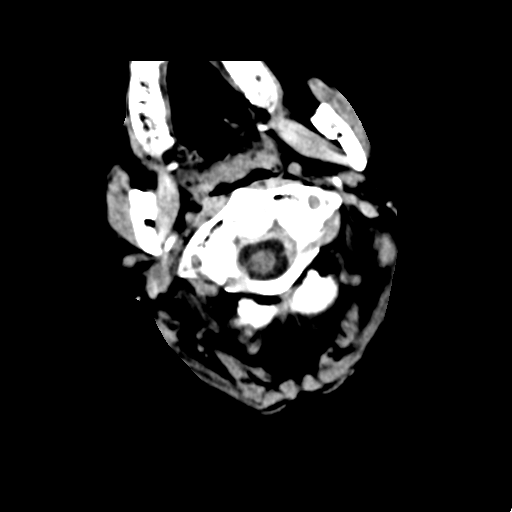
[im 2/32  bone]
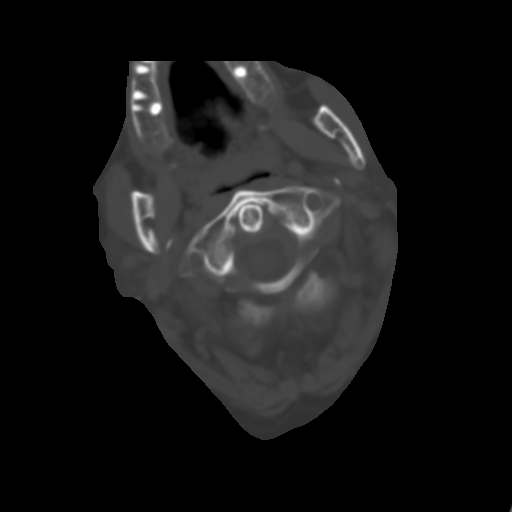
[im 4/32  brain]
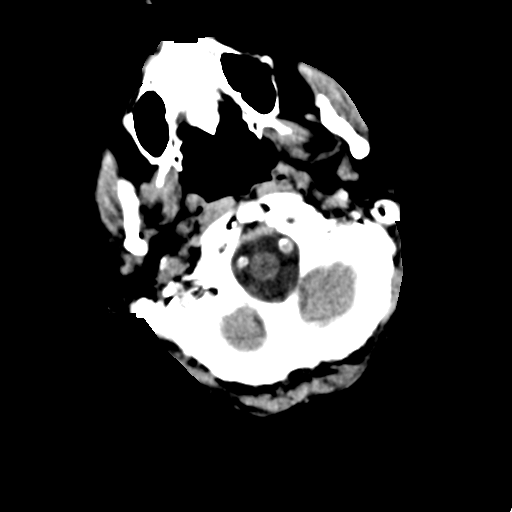
[im 5/32  brain]
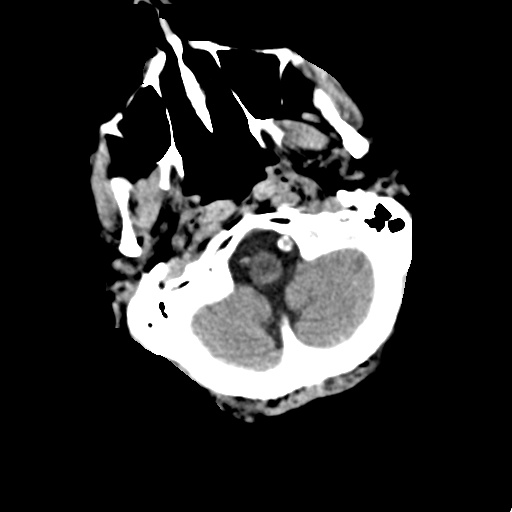
[im 7/32  brain]
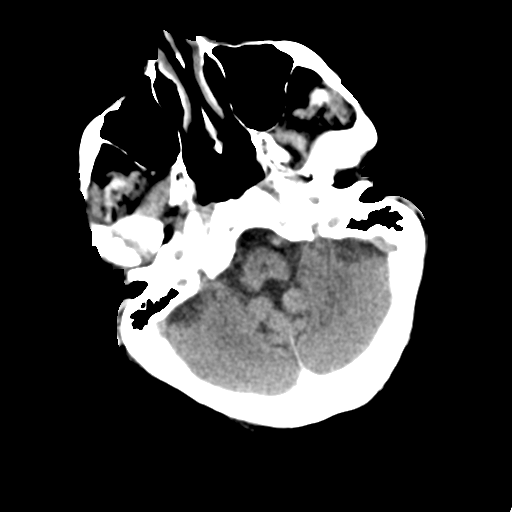
[im 10/32  brain]
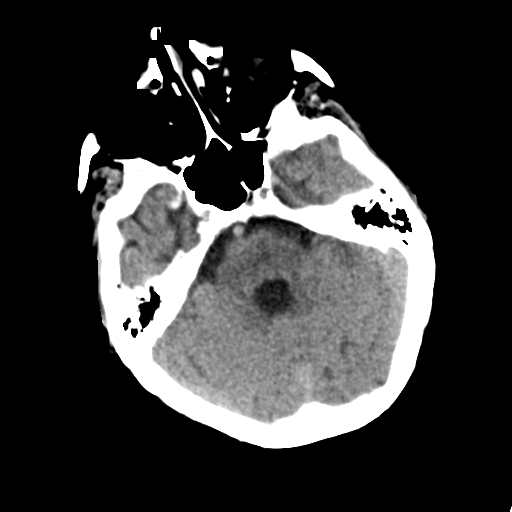
[im 10/32  bone]
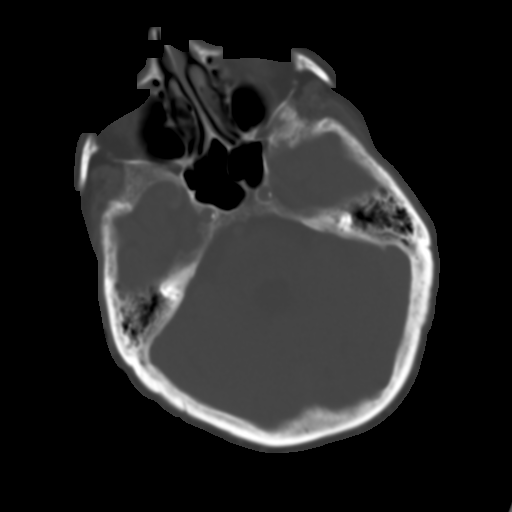
[im 12/32  brain]
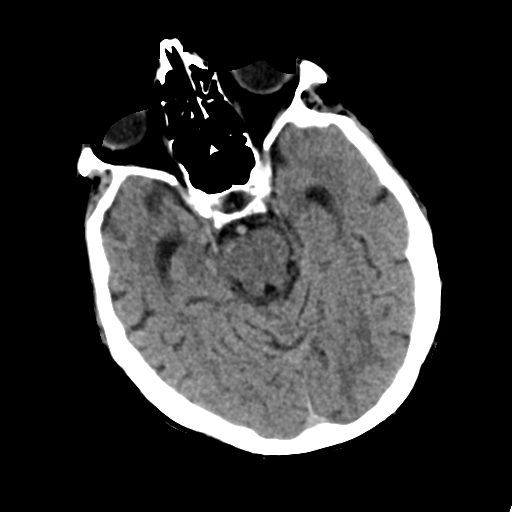
[im 14/32  brain]
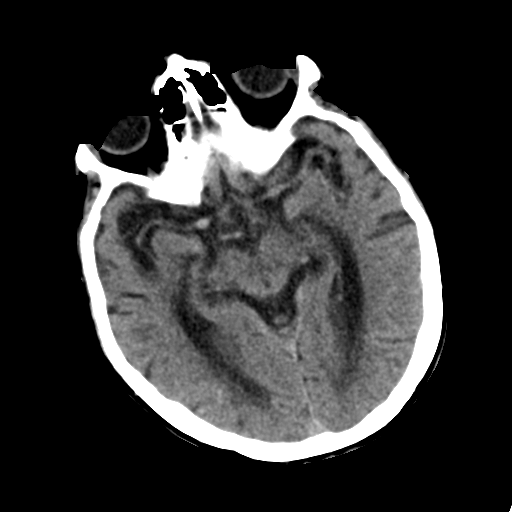
[im 15/32  brain]
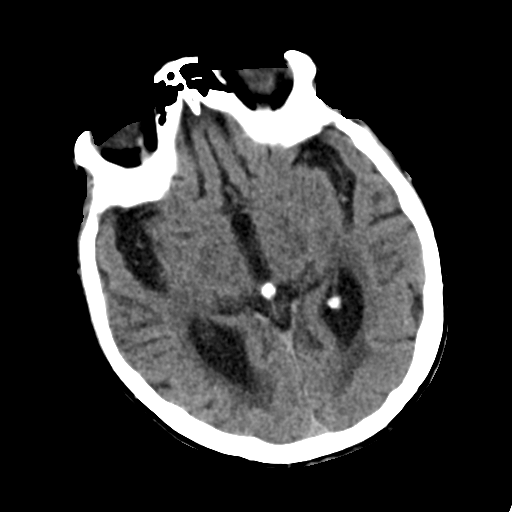
[im 17/32  brain]
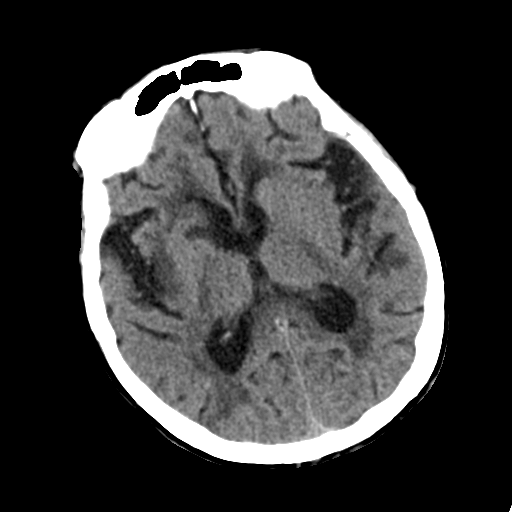
[im 17/32  bone]
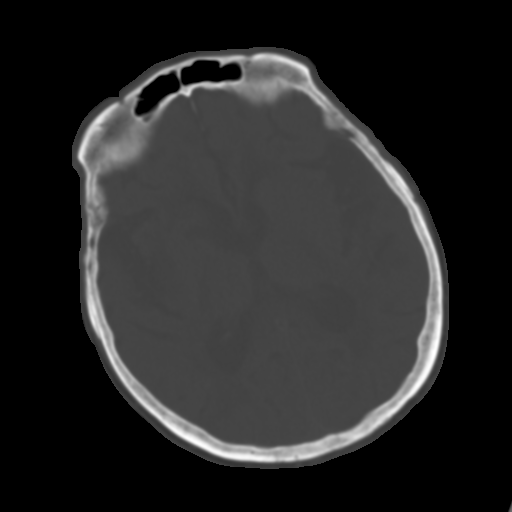
[im 18/32  brain]
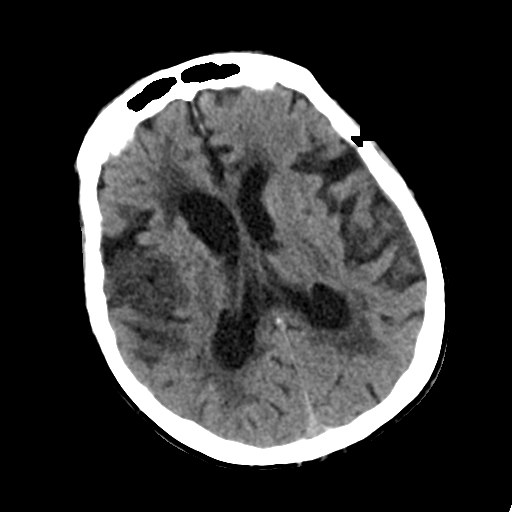
[im 20/32  brain]
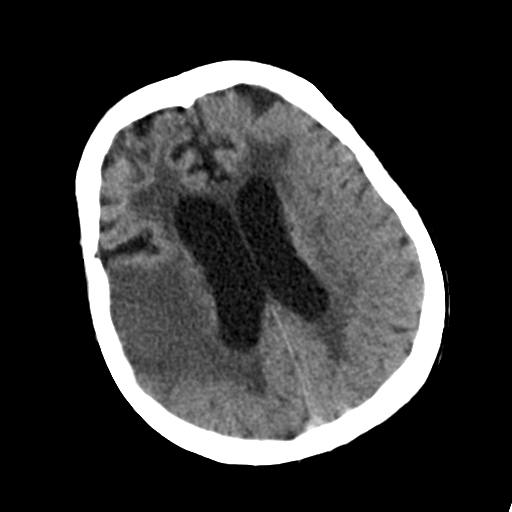
[im 22/32  brain]
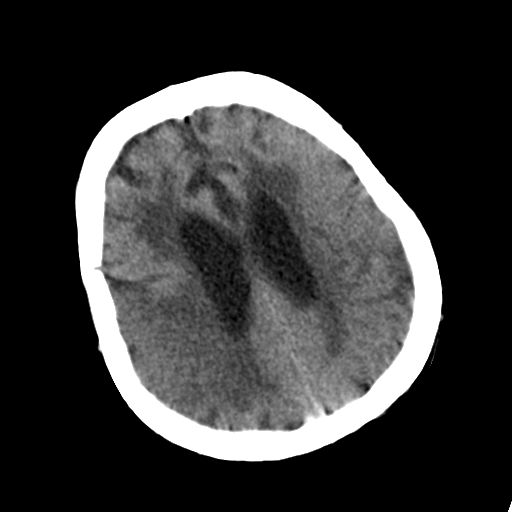
[im 25/32  brain]
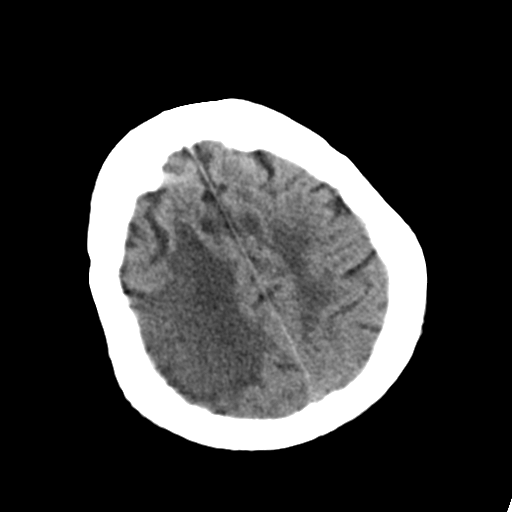
[im 25/32  bone]
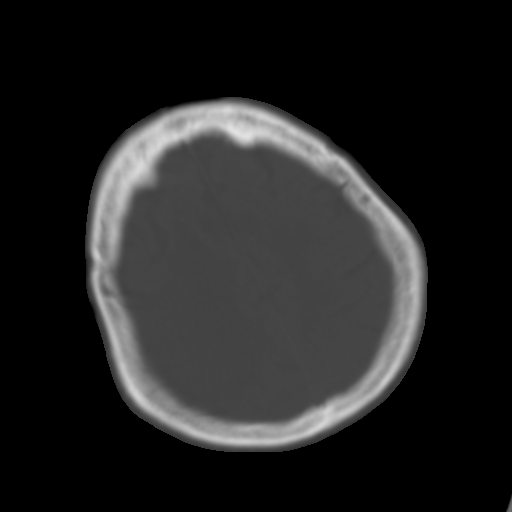
[im 27/32  brain]
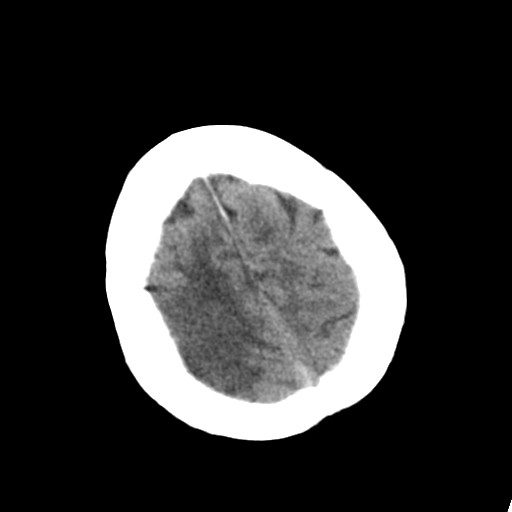
[im 28/32  brain]
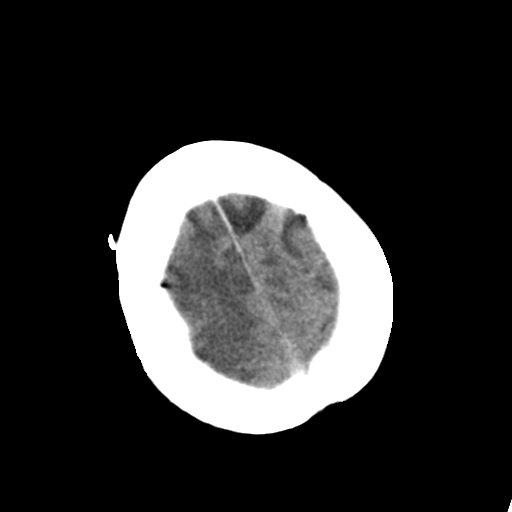
[im 30/32  brain]
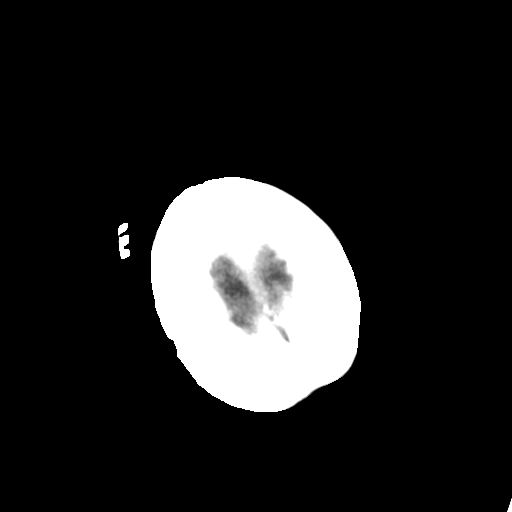

[16 of 30 positions shown; findings below may reference images not displayed]

FINDINGS: Bony calvarium appears intact. Mild diffuse cortical atrophy is
noted. There is been interval development of sulcal effacement and
large low density involving the right MCA territory consistent with
acute infarction. No midline shift is noted. Ventricular size is
within normal limits. There is no evidence of hemorrhage or mass
lesion. Mild chronic ischemic white matter disease again noted.
IMPRESSION: Interval development of large right acute MCA infarction. These
results will be called to the ordering clinician or representative
by the Radiologist Assistant, and communication documented in the
PACS or zVision Dashboard.
